# Patient Record
Sex: Female | Born: 1935 | Race: White | Hispanic: No | Marital: Married | Smoking: Never smoker
Health system: Southern US, Community
[De-identification: ages and names within clinical notes are randomized; demographics above are authoritative.]

## PROBLEM LIST (undated history)

## (undated) DIAGNOSIS — I495 Sick sinus syndrome: Secondary | ICD-10-CM

## (undated) DIAGNOSIS — I639 Cerebral infarction, unspecified: Secondary | ICD-10-CM

## (undated) DIAGNOSIS — I214 Non-ST elevation (NSTEMI) myocardial infarction: Secondary | ICD-10-CM

## (undated) DIAGNOSIS — I4891 Unspecified atrial fibrillation: Secondary | ICD-10-CM

---

## 2005-06-19 ENCOUNTER — Ambulatory Visit: Payer: Self-pay | Admitting: Internal Medicine

## 2006-06-24 ENCOUNTER — Ambulatory Visit: Payer: Self-pay | Admitting: Internal Medicine

## 2007-09-29 ENCOUNTER — Ambulatory Visit: Payer: Self-pay | Admitting: Internal Medicine

## 2008-10-04 ENCOUNTER — Ambulatory Visit: Payer: Self-pay | Admitting: Internal Medicine

## 2009-10-10 ENCOUNTER — Ambulatory Visit: Payer: Self-pay | Admitting: Internal Medicine

## 2009-10-27 ENCOUNTER — Emergency Department: Payer: Self-pay | Admitting: Emergency Medicine

## 2010-03-29 ENCOUNTER — Ambulatory Visit: Payer: Self-pay | Admitting: Internal Medicine

## 2010-05-23 ENCOUNTER — Ambulatory Visit: Payer: Self-pay | Admitting: Ophthalmology

## 2010-06-04 ENCOUNTER — Ambulatory Visit: Payer: Self-pay | Admitting: Ophthalmology

## 2010-07-02 ENCOUNTER — Ambulatory Visit: Payer: Self-pay | Admitting: Ophthalmology

## 2010-12-09 ENCOUNTER — Ambulatory Visit: Payer: Self-pay

## 2010-12-19 ENCOUNTER — Ambulatory Visit: Payer: Self-pay

## 2010-12-31 ENCOUNTER — Ambulatory Visit: Payer: Self-pay

## 2011-04-08 ENCOUNTER — Ambulatory Visit: Payer: Self-pay

## 2012-02-17 ENCOUNTER — Ambulatory Visit: Payer: Self-pay

## 2012-02-21 ENCOUNTER — Inpatient Hospital Stay: Payer: Self-pay | Admitting: Internal Medicine

## 2012-02-21 ENCOUNTER — Ambulatory Visit: Payer: Self-pay | Admitting: Orthopaedic Surgery

## 2012-02-21 LAB — URINALYSIS, COMPLETE
Glucose,UR: NEGATIVE mg/dL (ref 0–75)
Ketone: NEGATIVE
Protein: NEGATIVE
RBC,UR: NONE SEEN /HPF (ref 0–5)
Specific Gravity: 1.008 (ref 1.003–1.030)

## 2012-02-21 LAB — COMPREHENSIVE METABOLIC PANEL
Alkaline Phosphatase: 60 U/L (ref 50–136)
Anion Gap: 5 — ABNORMAL LOW (ref 7–16)
Bilirubin,Total: 0.3 mg/dL (ref 0.2–1.0)
Chloride: 103 mmol/L (ref 98–107)
Co2: 31 mmol/L (ref 21–32)
Creatinine: 1.14 mg/dL (ref 0.60–1.30)
EGFR (African American): 53 — ABNORMAL LOW
EGFR (Non-African Amer.): 46 — ABNORMAL LOW
Osmolality: 282 (ref 275–301)
Potassium: 3.6 mmol/L (ref 3.5–5.1)
SGOT(AST): 36 U/L (ref 15–37)
SGPT (ALT): 31 U/L
Sodium: 139 mmol/L (ref 136–145)

## 2012-02-21 LAB — PROTIME-INR
INR: 0.9
Prothrombin Time: 12.7 secs (ref 11.5–14.7)

## 2012-02-21 LAB — CBC
HGB: 11.6 g/dL — ABNORMAL LOW (ref 12.0–16.0)
MCH: 31.1 pg (ref 26.0–34.0)
MCHC: 33.1 g/dL (ref 32.0–36.0)
Platelet: 182 10*3/uL (ref 150–440)
RBC: 3.74 10*6/uL — ABNORMAL LOW (ref 3.80–5.20)
RDW: 13.4 % (ref 11.5–14.5)

## 2012-02-22 LAB — CBC WITH DIFFERENTIAL/PLATELET
Eosinophil #: 0 10*3/uL (ref 0.0–0.7)
Eosinophil %: 0.3 %
HGB: 10.4 g/dL — ABNORMAL LOW (ref 12.0–16.0)
Lymphocyte #: 1.4 10*3/uL (ref 1.0–3.6)
Lymphocyte %: 21.4 %
MCHC: 33.9 g/dL (ref 32.0–36.0)
Monocyte #: 0.6 x10 3/mm (ref 0.2–0.9)
Monocyte %: 8.9 %
Neutrophil #: 4.7 10*3/uL (ref 1.4–6.5)
Neutrophil %: 69.2 %
Platelet: 157 10*3/uL (ref 150–440)
WBC: 6.7 10*3/uL (ref 3.6–11.0)

## 2012-02-22 LAB — BASIC METABOLIC PANEL
BUN: 19 mg/dL — ABNORMAL HIGH (ref 7–18)
Calcium, Total: 8.4 mg/dL — ABNORMAL LOW (ref 8.5–10.1)
Chloride: 105 mmol/L (ref 98–107)
Co2: 28 mmol/L (ref 21–32)
Creatinine: 1.04 mg/dL (ref 0.60–1.30)
EGFR (African American): 59 — ABNORMAL LOW
Glucose: 108 mg/dL — ABNORMAL HIGH (ref 65–99)
Potassium: 3.6 mmol/L (ref 3.5–5.1)

## 2012-02-24 LAB — URINE CULTURE

## 2013-02-17 ENCOUNTER — Ambulatory Visit: Payer: Self-pay

## 2013-09-01 ENCOUNTER — Observation Stay: Payer: Self-pay | Admitting: Internal Medicine

## 2013-09-01 LAB — CBC WITH DIFFERENTIAL/PLATELET
Basophil #: 0 10*3/uL (ref 0.0–0.1)
Basophil %: 0.4 %
Eosinophil #: 0 10*3/uL (ref 0.0–0.7)
Eosinophil %: 0.1 %
HGB: 14.8 g/dL (ref 12.0–16.0)
Lymphocyte #: 1.7 10*3/uL (ref 1.0–3.6)
Lymphocyte %: 22.5 %
MCV: 92 fL (ref 80–100)
Neutrophil #: 5.5 10*3/uL (ref 1.4–6.5)
Neutrophil %: 70.6 %
Platelet: 276 10*3/uL (ref 150–440)
RDW: 13.5 % (ref 11.5–14.5)

## 2013-09-01 LAB — COMPREHENSIVE METABOLIC PANEL
Albumin: 3.8 g/dL (ref 3.4–5.0)
Anion Gap: 7 (ref 7–16)
Bilirubin,Total: 1.1 mg/dL — ABNORMAL HIGH (ref 0.2–1.0)
Chloride: 101 mmol/L (ref 98–107)
Co2: 26 mmol/L (ref 21–32)
Creatinine: 1.44 mg/dL — ABNORMAL HIGH (ref 0.60–1.30)
EGFR (African American): 40 — ABNORMAL LOW
EGFR (Non-African Amer.): 34 — ABNORMAL LOW
Glucose: 121 mg/dL — ABNORMAL HIGH (ref 65–99)
Sodium: 134 mmol/L — ABNORMAL LOW (ref 136–145)
Total Protein: 7.1 g/dL (ref 6.4–8.2)

## 2013-09-01 LAB — URINALYSIS, COMPLETE
Blood: NEGATIVE
Glucose,UR: NEGATIVE mg/dL (ref 0–75)
Nitrite: NEGATIVE
Ph: 7 (ref 4.5–8.0)
RBC,UR: 4 /HPF (ref 0–5)
Specific Gravity: 1.018 (ref 1.003–1.030)
WBC UR: 126 /HPF (ref 0–5)

## 2013-09-01 LAB — CK TOTAL AND CKMB (NOT AT ARMC)
CK, Total: 126 U/L (ref 21–215)
CK-MB: 5.2 ng/mL — ABNORMAL HIGH (ref 0.5–3.6)

## 2013-09-01 LAB — SEDIMENTATION RATE: Erythrocyte Sed Rate: 2 mm/hr (ref 0–30)

## 2013-09-01 LAB — TSH: Thyroid Stimulating Horm: 2.26 u[IU]/mL

## 2013-09-02 LAB — CBC WITH DIFFERENTIAL/PLATELET
Basophil #: 0 10*3/uL (ref 0.0–0.1)
Basophil %: 0.6 %
Eosinophil #: 0 10*3/uL (ref 0.0–0.7)
Eosinophil %: 0.8 %
HCT: 38.7 % (ref 35.0–47.0)
HGB: 13.4 g/dL (ref 12.0–16.0)
Lymphocyte #: 2.1 10*3/uL (ref 1.0–3.6)
MCH: 31.9 pg (ref 26.0–34.0)
MCV: 92 fL (ref 80–100)
Monocyte %: 9 %
Neutrophil #: 3.5 10*3/uL (ref 1.4–6.5)
Platelet: 220 10*3/uL (ref 150–440)
RDW: 13.7 % (ref 11.5–14.5)

## 2013-09-02 LAB — BASIC METABOLIC PANEL
Anion Gap: 6 — ABNORMAL LOW (ref 7–16)
BUN: 29 mg/dL — ABNORMAL HIGH (ref 7–18)
Chloride: 105 mmol/L (ref 98–107)
Co2: 26 mmol/L (ref 21–32)
EGFR (Non-African Amer.): 45 — ABNORMAL LOW
Glucose: 83 mg/dL (ref 65–99)
Potassium: 3.8 mmol/L (ref 3.5–5.1)

## 2013-09-02 LAB — LIPID PANEL: VLDL Cholesterol, Calc: 8 mg/dL (ref 5–40)

## 2013-09-19 ENCOUNTER — Ambulatory Visit: Payer: Self-pay | Admitting: Internal Medicine

## 2013-09-19 DIAGNOSIS — I214 Non-ST elevation (NSTEMI) myocardial infarction: Secondary | ICD-10-CM

## 2013-09-19 HISTORY — DX: Non-ST elevation (NSTEMI) myocardial infarction: I21.4

## 2013-09-24 ENCOUNTER — Inpatient Hospital Stay (HOSPITAL_COMMUNITY)
Admission: EM | Admit: 2013-09-24 | Discharge: 2013-10-04 | DRG: 023 | Disposition: A | Discharge status: Skilled Nursing Facility | Payer: Medicare Other | Attending: Neurology | Admitting: Neurology

## 2013-09-24 ENCOUNTER — Inpatient Hospital Stay (HOSPITAL_COMMUNITY): Payer: Medicare Other

## 2013-09-24 ENCOUNTER — Inpatient Hospital Stay (HOSPITAL_COMMUNITY): Payer: Medicare Other | Admitting: Anesthesiology

## 2013-09-24 ENCOUNTER — Encounter (HOSPITAL_COMMUNITY)
Admission: EM | Disposition: A | Discharge status: Skilled Nursing Facility | Payer: Self-pay | Source: Home / Self Care | Attending: Neurology

## 2013-09-24 ENCOUNTER — Encounter (HOSPITAL_COMMUNITY): Payer: Medicare Other | Admitting: Anesthesiology

## 2013-09-24 ENCOUNTER — Emergency Department (HOSPITAL_COMMUNITY): Payer: Medicare Other

## 2013-09-24 ENCOUNTER — Encounter (HOSPITAL_COMMUNITY): Payer: Self-pay | Admitting: Emergency Medicine

## 2013-09-24 DIAGNOSIS — E079 Disorder of thyroid, unspecified: Secondary | ICD-10-CM | POA: Diagnosis present

## 2013-09-24 DIAGNOSIS — R1314 Dysphagia, pharyngoesophageal phase: Secondary | ICD-10-CM | POA: Diagnosis present

## 2013-09-24 DIAGNOSIS — E039 Hypothyroidism, unspecified: Secondary | ICD-10-CM | POA: Diagnosis present

## 2013-09-24 DIAGNOSIS — I6340 Cerebral infarction due to embolism of unspecified cerebral artery: Principal | ICD-10-CM | POA: Diagnosis present

## 2013-09-24 DIAGNOSIS — E876 Hypokalemia: Secondary | ICD-10-CM | POA: Diagnosis present

## 2013-09-24 DIAGNOSIS — J810 Acute pulmonary edema: Secondary | ICD-10-CM | POA: Diagnosis present

## 2013-09-24 DIAGNOSIS — I498 Other specified cardiac arrhythmias: Secondary | ICD-10-CM | POA: Diagnosis not present

## 2013-09-24 DIAGNOSIS — Z66 Do not resuscitate: Secondary | ICD-10-CM | POA: Diagnosis present

## 2013-09-24 DIAGNOSIS — D72829 Elevated white blood cell count, unspecified: Secondary | ICD-10-CM | POA: Diagnosis present

## 2013-09-24 DIAGNOSIS — E860 Dehydration: Secondary | ICD-10-CM | POA: Diagnosis present

## 2013-09-24 DIAGNOSIS — J9601 Acute respiratory failure with hypoxia: Secondary | ICD-10-CM | POA: Diagnosis not present

## 2013-09-24 DIAGNOSIS — I509 Heart failure, unspecified: Secondary | ICD-10-CM | POA: Diagnosis present

## 2013-09-24 DIAGNOSIS — H5340 Unspecified visual field defects: Secondary | ICD-10-CM | POA: Diagnosis present

## 2013-09-24 DIAGNOSIS — R471 Dysarthria and anarthria: Secondary | ICD-10-CM | POA: Diagnosis present

## 2013-09-24 DIAGNOSIS — Z681 Body mass index (BMI) 19 or less, adult: Secondary | ICD-10-CM

## 2013-09-24 DIAGNOSIS — R64 Cachexia: Secondary | ICD-10-CM | POA: Diagnosis present

## 2013-09-24 DIAGNOSIS — I4891 Unspecified atrial fibrillation: Secondary | ICD-10-CM | POA: Diagnosis present

## 2013-09-24 DIAGNOSIS — I63411 Cerebral infarction due to embolism of right middle cerebral artery: Secondary | ICD-10-CM | POA: Diagnosis present

## 2013-09-24 DIAGNOSIS — J9600 Acute respiratory failure, unspecified whether with hypoxia or hypercapnia: Secondary | ICD-10-CM | POA: Diagnosis present

## 2013-09-24 DIAGNOSIS — D62 Acute posthemorrhagic anemia: Secondary | ICD-10-CM | POA: Diagnosis not present

## 2013-09-24 DIAGNOSIS — I6350 Cerebral infarction due to unspecified occlusion or stenosis of unspecified cerebral artery: Secondary | ICD-10-CM

## 2013-09-24 DIAGNOSIS — Z23 Encounter for immunization: Secondary | ICD-10-CM

## 2013-09-24 DIAGNOSIS — J8410 Pulmonary fibrosis, unspecified: Secondary | ICD-10-CM | POA: Diagnosis present

## 2013-09-24 DIAGNOSIS — I639 Cerebral infarction, unspecified: Secondary | ICD-10-CM

## 2013-09-24 DIAGNOSIS — J811 Chronic pulmonary edema: Secondary | ICD-10-CM | POA: Diagnosis not present

## 2013-09-24 DIAGNOSIS — I10 Essential (primary) hypertension: Secondary | ICD-10-CM | POA: Diagnosis present

## 2013-09-24 DIAGNOSIS — G8190 Hemiplegia, unspecified affecting unspecified side: Secondary | ICD-10-CM | POA: Diagnosis present

## 2013-09-24 HISTORY — PX: RADIOLOGY WITH ANESTHESIA: SHX6223

## 2013-09-24 HISTORY — DX: Unspecified atrial fibrillation: I48.91

## 2013-09-24 HISTORY — DX: Cerebral infarction, unspecified: I63.9

## 2013-09-24 LAB — RAPID URINE DRUG SCREEN, HOSP PERFORMED
Benzodiazepines: NOT DETECTED
Cocaine: NOT DETECTED
Opiates: NOT DETECTED
Tetrahydrocannabinol: NOT DETECTED

## 2013-09-24 LAB — URINALYSIS, ROUTINE W REFLEX MICROSCOPIC
Glucose, UA: NEGATIVE mg/dL
Hgb urine dipstick: NEGATIVE
Ketones, ur: 15 mg/dL — AB
Specific Gravity, Urine: 1.02 (ref 1.005–1.030)
Urobilinogen, UA: 1 mg/dL (ref 0.0–1.0)
pH: 5.5 (ref 5.0–8.0)

## 2013-09-24 LAB — BLOOD GAS, ARTERIAL
Acid-base deficit: 0.7 mmol/L (ref 0.0–2.0)
Drawn by: 28701
FIO2: 0.6 %
MECHVT: 400 mL
PEEP: 5 cmH2O
Patient temperature: 98.6
RATE: 14 resp/min
TCO2: 23 mmol/L (ref 0–100)
pH, Arterial: 7.506 — ABNORMAL HIGH (ref 7.350–7.450)
pO2, Arterial: 256 mmHg — ABNORMAL HIGH (ref 80.0–100.0)

## 2013-09-24 LAB — COMPREHENSIVE METABOLIC PANEL
AST: 89 U/L — ABNORMAL HIGH (ref 0–37)
Albumin: 2.8 g/dL — ABNORMAL LOW (ref 3.5–5.2)
BUN: 37 mg/dL — ABNORMAL HIGH (ref 6–23)
CO2: 21 mEq/L (ref 19–32)
Calcium: 9.1 mg/dL (ref 8.4–10.5)
Creatinine, Ser: 1.42 mg/dL — ABNORMAL HIGH (ref 0.50–1.10)
GFR calc non Af Amer: 34 mL/min — ABNORMAL LOW (ref >90)

## 2013-09-24 LAB — CBC
HCT: 32.5 % — ABNORMAL LOW (ref 36.0–46.0)
MCHC: 34.5 g/dL (ref 30.0–36.0)
MCV: 92.6 fL (ref 78.0–100.0)
RBC: 3.51 MIL/uL — ABNORMAL LOW (ref 3.87–5.11)
RDW: 13.7 % (ref 11.5–15.5)

## 2013-09-24 LAB — CBC WITH DIFFERENTIAL/PLATELET
Basophils Absolute: 0 10*3/uL (ref 0.0–0.1)
Eosinophils Absolute: 0 10*3/uL (ref 0.0–0.7)
Eosinophils Relative: 0 % (ref 0–5)
Lymphocytes Relative: 7 % — ABNORMAL LOW (ref 12–46)
Lymphs Abs: 0.7 10*3/uL (ref 0.7–4.0)
MCH: 31 pg (ref 26.0–34.0)
Neutrophils Relative %: 88 % — ABNORMAL HIGH (ref 43–77)
Platelets: 293 10*3/uL (ref 150–400)
RBC: 3.03 MIL/uL — ABNORMAL LOW (ref 3.87–5.11)
RDW: 13.8 % (ref 11.5–15.5)
WBC: 9.8 10*3/uL (ref 4.0–10.5)

## 2013-09-24 LAB — POCT I-STAT 7, (LYTES, BLD GAS, ICA,H+H)
Acid-base deficit: 9 mmol/L — ABNORMAL HIGH (ref 0.0–2.0)
Bicarbonate: 17.8 mEq/L — ABNORMAL LOW (ref 20.0–24.0)
Calcium, Ion: 1.1 mmol/L — ABNORMAL LOW (ref 1.13–1.30)
HCT: 25 % — ABNORMAL LOW (ref 36.0–46.0)
Hemoglobin: 8.5 g/dL — ABNORMAL LOW (ref 12.0–15.0)
Potassium: 2.6 mEq/L — CL (ref 3.5–5.1)
Sodium: 134 mEq/L — ABNORMAL LOW (ref 135–145)
pCO2 arterial: 39.9 mmHg (ref 35.0–45.0)
pH, Arterial: 7.258 — ABNORMAL LOW (ref 7.350–7.450)
pO2, Arterial: 151 mmHg — ABNORMAL HIGH (ref 80.0–100.0)

## 2013-09-24 LAB — POCT I-STAT, CHEM 8
BUN: 34 mg/dL — ABNORMAL HIGH (ref 6–23)
Chloride: 97 mEq/L (ref 96–112)
Creatinine, Ser: 1.6 mg/dL — ABNORMAL HIGH (ref 0.50–1.10)
Glucose, Bld: 100 mg/dL — ABNORMAL HIGH (ref 70–99)
Potassium: 3.3 mEq/L — ABNORMAL LOW (ref 3.5–5.1)
Sodium: 132 mEq/L — ABNORMAL LOW (ref 135–145)

## 2013-09-24 LAB — TROPONIN I: Troponin I: <0.3 ng/mL (ref <0.30)

## 2013-09-24 LAB — DIFFERENTIAL
Basophils Absolute: 0 10*3/uL (ref 0.0–0.1)
Basophils Relative: 0 % (ref 0–1)
Eosinophils Relative: 0 % (ref 0–5)
Lymphocytes Relative: 7 % — ABNORMAL LOW (ref 12–46)
Lymphs Abs: 0.7 10*3/uL (ref 0.7–4.0)
Monocytes Absolute: 0.5 10*3/uL (ref 0.1–1.0)

## 2013-09-24 LAB — MRSA PCR SCREENING: MRSA by PCR: NEGATIVE

## 2013-09-24 LAB — GLUCOSE, CAPILLARY: Glucose-Capillary: 150 mg/dL — ABNORMAL HIGH (ref 70–99)

## 2013-09-24 LAB — POCT I-STAT TROPONIN I: Troponin i, poc: 0.03 ng/mL (ref 0.00–0.08)

## 2013-09-24 LAB — ABO/RH: ABO/RH(D): A POS

## 2013-09-24 LAB — PREPARE RBC (CROSSMATCH)

## 2013-09-24 LAB — APTT: aPTT: 28 seconds (ref 24–37)

## 2013-09-24 LAB — ETHANOL: Alcohol, Ethyl (B): <11 mg/dL (ref 0–11)

## 2013-09-24 SURGERY — RADIOLOGY WITH ANESTHESIA
Anesthesia: General

## 2013-09-24 MED ORDER — DILTIAZEM LOAD VIA INFUSION
5.0000 mg | Freq: Once | INTRAVENOUS | Status: DC
  Filled 2013-09-24: qty 5

## 2013-09-24 MED ORDER — NITROGLYCERIN 1 MG/10 ML FOR IR/CATH LAB
100.0000 ug | INTRA_ARTERIAL | Status: AC
  Filled 2013-09-24: qty 10

## 2013-09-24 MED ORDER — LABETALOL HCL 5 MG/ML IV SOLN: 10.0000 mg | INTRAVENOUS | Status: DC | PRN

## 2013-09-24 MED ORDER — SENNOSIDES-DOCUSATE SODIUM 8.6-50 MG PO TABS
1.0000 | ORAL_TABLET | Freq: Every evening | ORAL | Status: DC | PRN
  Filled 2013-09-24: qty 1

## 2013-09-24 MED ORDER — ALBUMIN HUMAN 5 % IV SOLN
INTRAVENOUS | Status: DC | PRN
  Administered 2013-09-24: 15:00:00 via INTRAVENOUS

## 2013-09-24 MED ORDER — DEXTROSE 5 % IV SOLN
2.0000 g | Freq: Once | INTRAVENOUS | Status: AC
  Administered 2013-09-24: 2 g via INTRAVENOUS
  Filled 2013-09-24: qty 20

## 2013-09-24 MED ORDER — FENTANYL CITRATE 0.05 MG/ML IJ SOLN
50.0000 ug | INTRAMUSCULAR | Status: DC | PRN
  Administered 2013-09-24 – 2013-09-25 (×2): 50 ug via INTRAVENOUS
  Filled 2013-09-24 (×2): qty 2

## 2013-09-24 MED ORDER — CHLORHEXIDINE GLUCONATE 0.12 % MT SOLN
OROMUCOSAL | Status: AC
  Administered 2013-09-24: 15 mL
  Filled 2013-09-24: qty 15

## 2013-09-24 MED ORDER — ALTEPLASE (STROKE) FULL DOSE INFUSION
0.9000 mg/kg | Freq: Once | INTRAVENOUS | Status: AC
  Administered 2013-09-24: 41 mg via INTRAVENOUS
  Filled 2013-09-24: qty 41

## 2013-09-24 MED ORDER — CHLORHEXIDINE GLUCONATE 0.12 % MT SOLN
15.0000 mL | Freq: Two times a day (BID) | OROMUCOSAL | Status: DC
  Administered 2013-09-24 – 2013-10-04 (×20): 15 mL via OROMUCOSAL
  Filled 2013-09-24 (×20): qty 15

## 2013-09-24 MED ORDER — HYDROMORPHONE HCL PF 1 MG/ML IJ SOLN: 0.2500 mg | INTRAMUSCULAR | Status: DC | PRN

## 2013-09-24 MED ORDER — ONDANSETRON HCL 4 MG/2ML IJ SOLN: 4.0000 mg | Freq: Once | INTRAMUSCULAR | Status: DC | PRN

## 2013-09-24 MED ORDER — POTASSIUM CHLORIDE 10 MEQ/100ML IV SOLN
10.0000 meq | INTRAVENOUS | Status: AC
  Administered 2013-09-24 (×2): 10 meq via INTRAVENOUS
  Filled 2013-09-24 (×2): qty 100

## 2013-09-24 MED ORDER — VECURONIUM BROMIDE 10 MG IV SOLR
INTRAVENOUS | Status: DC | PRN
  Administered 2013-09-24: 10 mg via INTRAVENOUS

## 2013-09-24 MED ORDER — DILTIAZEM HCL 100 MG IV SOLR
5.0000 mg/h | INTRAVENOUS | Status: DC
  Administered 2013-09-26 – 2013-09-28 (×4): 5 mg/h via INTRAVENOUS
  Filled 2013-09-24 (×6): qty 100

## 2013-09-24 MED ORDER — ALTEPLASE 30 MG/30 ML FOR INTERV. RAD
1.0000 mg | INTRA_ARTERIAL | Status: AC | PRN
  Administered 2013-09-24: 2 mg via INTRA_ARTERIAL
  Administered 2013-09-24: 6 mg via INTRA_ARTERIAL
  Administered 2013-09-24: 2.8 mg via INTRA_ARTERIAL
  Filled 2013-09-24: qty 30

## 2013-09-24 MED ORDER — PANTOPRAZOLE SODIUM 40 MG IV SOLR
40.0000 mg | Freq: Every day | INTRAVENOUS | Status: DC
  Administered 2013-09-24 – 2013-09-27 (×4): 40 mg via INTRAVENOUS
  Filled 2013-09-24 (×7): qty 40

## 2013-09-24 MED ORDER — IOHEXOL 300 MG/ML  SOLN
150.0000 mL | Freq: Once | INTRAMUSCULAR | Status: AC | PRN
  Administered 2013-09-24: 100 mL via INTRA_ARTERIAL

## 2013-09-24 MED ORDER — CEFAZOLIN SODIUM 1-5 GM-% IV SOLN
INTRAVENOUS | Status: AC
  Administered 2013-09-24: 2 g via INTRAVENOUS
  Filled 2013-09-24: qty 100

## 2013-09-24 MED ORDER — ACETAMINOPHEN 650 MG RE SUPP: 650.0000 mg | RECTAL | Status: DC | PRN

## 2013-09-24 MED ORDER — ACETAMINOPHEN 650 MG RE SUPP: 650.0000 mg | Freq: Four times a day (QID) | RECTAL | Status: DC | PRN

## 2013-09-24 MED ORDER — PHENYLEPHRINE HCL 10 MG/ML IJ SOLN
10.0000 mg | INTRAVENOUS | Status: DC | PRN
  Administered 2013-09-24: 40 ug/min via INTRAVENOUS

## 2013-09-24 MED ORDER — METOPROLOL TARTRATE 1 MG/ML IV SOLN
2.5000 mg | Freq: Once | INTRAVENOUS | Status: AC
  Administered 2013-09-24: 2.5 mg via INTRAVENOUS
  Filled 2013-09-24: qty 5

## 2013-09-24 MED ORDER — SODIUM CHLORIDE 0.9 % IV SOLN: INTRAVENOUS | Status: DC

## 2013-09-24 MED ORDER — DILTIAZEM HCL 100 MG IV SOLR
100.0000 mg | INTRAVENOUS | Status: DC | PRN
  Administered 2013-09-24: 5 mg via INTRAVENOUS

## 2013-09-24 MED ORDER — ONDANSETRON HCL 4 MG/2ML IJ SOLN: 4.0000 mg | Freq: Four times a day (QID) | INTRAMUSCULAR | Status: DC | PRN

## 2013-09-24 MED ORDER — PROPOFOL 10 MG/ML IV BOLUS
INTRAVENOUS | Status: DC | PRN
  Administered 2013-09-24: 80 mg via INTRAVENOUS

## 2013-09-24 MED ORDER — SODIUM CHLORIDE 0.9 % IV SOLN
250.0000 mL | Freq: Once | INTRAVENOUS | Status: AC
  Administered 2013-09-24: 250 mL via INTRAVENOUS

## 2013-09-24 MED ORDER — SUCCINYLCHOLINE CHLORIDE 20 MG/ML IJ SOLN
INTRAMUSCULAR | Status: DC | PRN
  Administered 2013-09-24: 100 mg via INTRAVENOUS

## 2013-09-24 MED ORDER — ACETAMINOPHEN 500 MG PO TABS: 1000.0000 mg | ORAL_TABLET | Freq: Four times a day (QID) | ORAL | Status: DC | PRN

## 2013-09-24 MED ORDER — DILTIAZEM HCL 100 MG IV SOLR
100.0000 mg | INTRAVENOUS | Status: DC | PRN
  Administered 2013-09-24: 5 mg/h via INTRAVENOUS

## 2013-09-24 MED ORDER — PROPOFOL 10 MG/ML IV EMUL
10.0000 ug/kg/min | INTRAVENOUS | Status: DC
  Administered 2013-09-24: 50 ug/kg/min via INTRAVENOUS
  Administered 2013-09-25: 25 ug/kg/min via INTRAVENOUS
  Filled 2013-09-24 (×2): qty 100

## 2013-09-24 MED ORDER — SODIUM BICARBONATE 8.4 % IV SOLN
INTRAVENOUS | Status: DC | PRN
  Administered 2013-09-24 (×2): 50 meq via INTRAVENOUS

## 2013-09-24 MED ORDER — NICARDIPINE HCL IN NACL 20-0.86 MG/200ML-% IV SOLN: 5.0000 mg/h | INTRAVENOUS | Status: DC

## 2013-09-24 MED ORDER — SODIUM CHLORIDE 0.9 % IV SOLN
INTRAVENOUS | Status: DC
  Administered 2013-09-24 (×2): via INTRAVENOUS

## 2013-09-24 MED ORDER — ACETAMINOPHEN 325 MG PO TABS: 650.0000 mg | ORAL_TABLET | ORAL | Status: DC | PRN

## 2013-09-24 NOTE — ED Notes (Signed)
Pt's husband at bedside. Pt now able to move left leg spontaneously.

## 2013-09-24 NOTE — ED Notes (Signed)
Pt is itching the right side of her face. Pt is resting in bed with head of bed flat and IV fluids running

## 2013-09-24 NOTE — Progress Notes (Signed)
Pt. met in IR bagged by CRNA to CT, then transported on vent to 59M.

## 2013-09-24 NOTE — Code Documentation (Signed)
Code stroke was activated.  As per patients husband, was in usual state of health and at 0900, patient developed  Sudden paralysis of left side and slurred speech.  NIHSS 19, TPA given.

## 2013-09-24 NOTE — Consult Note (Addendum)
Referring Physician:  ED    Chief Complaint: CODE STROKE: DYSARTHRIA WITH LEFT HEMIPARESIS.  HPI:                                                                                                                                         Dorothy [name] is an 79 y.o. female with a past medical history significant for  atrial fibrillation, brought to [facility] ED by ambulance as a code stroke due tithe above stated symptoms. She was last known well at 9 am today, talking to her husband and suddenly became less responsive, fell to the floor, and was EMS arrived shew was not able to move the left side and had dysarthria. Never had similar symptoms before. Initial NIHSS 19. CT brain revealed questionable ill defined area of hypodensity left MCA territory as well as question minimal hyperdensity of the right carotid terminus/M1 segment right middle cerebral artery raises the possibility of thrombus at this level. She is alert and awake, following commands, and started improving in then ED after receiving the IV tpa bolus.      Date last known well: 09/24/13 Time last known well: 9 am tPA Given: yes NIHSS: 19 MRS:   History reviewed. No pertinent past medical history.  History reviewed. No pertinent past surgical history.  No family history on file. Social History:  has no tobacco, alcohol, and drug history on file.  Allergies: Allergies not on file  Medications:                                                                                                                           I have reviewed the patient's current medications.  ROS:  Unable to obtain reliably due to mental status.                                                                                                      History obtained from chart review and husband.  Physical exam: pleasant female in no apparent distress. Blood pressure 127/75, pulse 130, temperature 97.6 F (36.4 C), temperature source Oral, resp. rate 21, weight  45.36 kg (100 lb), SpO2 93.00%. Head: normocephalic. Neck: supple, no bruits, no JVD. Cardiac: no murmurs. Lungs: clear. Abdomen: soft, no tender, no mass. Extremities: no edema.  Neurologic Examination:                                                                                                      Mental Status: Alert, awake, oriented, thought content appropriate.  Mild dysarthria/dysphasia.  Able to follow 3 step commands without difficulty. Cranial Nerves: II: Discs flat bilaterally; left visual filed defect, pupils equal, round, reactive to light and accommodation III,IV, VI: ptosis not present, right gaze preference. V,VII: smile asymmetric with left face weakness, facial light touch sensation normal bilaterally VIII: hearing normal bilaterally IX,X: gag reflex present XI: bilateral shoulder shrug XII: midline tongue extension without atrophy or fasciculations  Motor: Significant for left HP Tone and bulk:normal tone throughout; no atrophy noted Sensory: Pinprick and light touch intact throughout, bilaterally Deep Tendon Reflexes:  Right: Upper Extremity   Left: Upper extremity   biceps (C-5 to C-6) 2/4   biceps (C-5 to C-6) 2/4 tricep (C7) 2/4    triceps (C7) 2/4 Brachioradialis (C6) 2/4  Brachioradialis (C6) 2/4  Lower Extremity Lower Extremity  quadriceps (L-2 to L-4) 2/4   quadriceps (L-2 to L-4) 2/4 Achilles (S1) 2/4   Achilles (S1) 2/4  Plantars: Right: downgoing   Left: upgoing Cerebellar: normal finger-to-nose,  normal heel-to-shin test Gait:  No tested. CV: pulses palpable throughout    Results for orders placed during the hospital encounter of 09/24/13 (from the past 48 hour(s))  ETHANOL     Status: None   Collection Time    09/24/13 11:05 AM      Result Value Range   Alcohol, Ethyl (B) <11  0 - 11 mg/dL   Comment:            LOWEST DETECTABLE LIMIT FOR     SERUM ALCOHOL IS 11 mg/dL     FOR MEDICAL PURPOSES ONLY  PROTIME-INR     Status: None    Collection Time    09/24/13 11:05 AM      Result Value Range   Prothrombin Time 14.3  11.6 - 15.2 seconds   INR 1.13  0.00 - 1.49  APTT     Status: None   Collection Time    09/24/13 11:05 AM      Result Value Range   aPTT 28  24 - 37 seconds  CBC     Status: Abnormal   Collection Time    09/24/13 11:05 AM      Result Value Range   WBC 9.6  4.0 - 10.5 K/uL   RBC 3.51 (*) 3.87 - 5.11 MIL/uL   Hemoglobin 11.2 (*) 12.0 - 15.0 g/dL   HCT 40.9 (*) 81.1 - 91.4 %   MCV 92.6  78.0 - 100.0 fL   MCH 31.9  26.0 - 34.0 pg  MCHC 34.5  30.0 - 36.0 g/dL   RDW 81.1  91.4 - 78.2 %   Platelets 344  150 - 400 K/uL  DIFFERENTIAL     Status: Abnormal   Collection Time    09/24/13 11:05 AM      Result Value Range   Neutrophils Relative % 88 (*) 43 - 77 %   Neutro Abs 8.5 (*) 1.7 - 7.7 K/uL   Lymphocytes Relative 7 (*) 12 - 46 %   Lymphs Abs 0.7  0.7 - 4.0 K/uL   Monocytes Relative 5  3 - 12 %   Monocytes Absolute 0.5  0.1 - 1.0 K/uL   Eosinophils Relative 0  0 - 5 %   Eosinophils Absolute 0.0  0.0 - 0.7 K/uL   Basophils Relative 0  0 - 1 %   Basophils Absolute 0.0  0.0 - 0.1 K/uL  COMPREHENSIVE METABOLIC PANEL     Status: Abnormal   Collection Time    09/24/13 11:05 AM      Result Value Range   Sodium 131 (*) 135 - 145 mEq/L   Potassium 3.2 (*) 3.5 - 5.1 mEq/L   Chloride 91 (*) 96 - 112 mEq/L   CO2 21  19 - 32 mEq/L   Glucose, Bld 99  70 - 99 mg/dL   BUN 37 (*) 6 - 23 mg/dL   Creatinine, Ser 9.56 (*) 0.50 - 1.10 mg/dL   Calcium 9.1  8.4 - 21.3 mg/dL   Total Protein 6.4  6.0 - 8.3 g/dL   Albumin 2.8 (*) 3.5 - 5.2 g/dL   AST 89 (*) 0 - 37 U/L   ALT 91 (*) 0 - 35 U/L   Alkaline Phosphatase 115  39 - 117 U/L   Total Bilirubin 0.6  0.3 - 1.2 mg/dL   GFR calc non Af Amer 34 (*) >90 mL/min   GFR calc Af Amer 39 (*) >90 mL/min   Comment: (NOTE)     The eGFR has been calculated using the CKD EPI equation.     This calculation has not been validated in all clinical situations.      eGFR's persistently <90 mL/min signify possible Chronic Kidney     Disease.  TROPONIN I     Status: None   Collection Time    09/24/13 11:05 AM      Result Value Range   Troponin I <0.30  <0.30 ng/mL   Comment:            Due to the release kinetics of cTnI,     a negative result within the first hours     of the onset of symptoms does not rule out     myocardial infarction with certainty.     If myocardial infarction is still suspected,     repeat the test at appropriate intervals.  POCT I-STAT TROPONIN I     Status: None   Collection Time    09/24/13 11:19 AM      Result Value Range   Troponin i, poc 0.03  0.00 - 0.08 ng/mL   Comment 3            Comment: Due to the release kinetics of cTnI,     a negative result within the first hours     of the onset of symptoms does not rule out     myocardial infarction with certainty.     If myocardial infarction is still suspected,     repeat  the test at appropriate intervals.  POCT I-STAT, CHEM 8     Status: Abnormal   Collection Time    09/24/13 11:20 AM      Result Value Range   Sodium 132 (*) 135 - 145 mEq/L   Potassium 3.3 (*) 3.5 - 5.1 mEq/L   Chloride 97  96 - 112 mEq/L   BUN 34 (*) 6 - 23 mg/dL   Creatinine, Ser 2.13 (*) 0.50 - 1.10 mg/dL   Glucose, Bld 086 (*) 70 - 99 mg/dL   Calcium, Ion 5.78  4.69 - 1.30 mmol/L   TCO2 23  0 - 100 mmol/L   Hemoglobin 12.2  12.0 - 15.0 g/dL   HCT 62.9  52.8 - 41.3 %  GLUCOSE, CAPILLARY     Status: None   Collection Time    09/24/13 11:29 AM      Result Value Range   Glucose-Capillary 88  70 - 99 mg/dL   Ct Head Wo Contrast  09/24/2013   CLINICAL DATA:  Left-sided paralysis.  Difficulty speaking.  EXAM: CT HEAD WITHOUT CONTRAST  TECHNIQUE: Contiguous axial images were obtained from the base of the skull through the vertex without intravenous contrast.  COMPARISON:  04/08/2011.  FINDINGS: Indistinct appearance of the right basal ganglia, right caudate, right subinsular region, right peri  operculum region and portion of the right frontal lobe suggestive of a right middle cerebral artery acute infarct involving over 1/3 of the distribution of the right middle cerebral artery.  Question minimal hyperdensity of the right carotid terminus/M1 segment right middle cerebral artery raises the possibility of thrombus at this level.  No intracranial hemorrhage currently noted.  No intracranial mass lesion noted on this unenhanced exam.  Vascular calcifications.  IMPRESSION: Indistinct appearance of the right basal ganglia, right caudate, right subinsular region, right peri operculum region and portion of the right frontal lobe suggestive of a right middle cerebral artery acute infarct involving over 1/3 of the distribution of the right middle cerebral artery.  Question minimal hyperdensity of the right carotid terminus/M1 segment right middle cerebral artery raises the possibility of thrombus at this level.  No intracranial hemorrhage currently noted.  These results were called by telephone at the time of interpretation on 09/24/2013 at 11:24 AM to Dr. Cyril Mourning, who verbally acknowledged these results.   Electronically Signed   By: Bridgett Larsson M.D.   On: 09/24/2013 11:31     Assessment: 79 y.o. female with probable embolic right MCA ischemic infarct. NIHSS 19. CT brain with questionable, ill defined hypodensity right MCA territory and ? Hyperdense right MCA. Will go ah ead with IV thrombolysis, as the CT brain findings are not entirely clear. High NIHSS is concerning for major intracranial arterial involvement by a clot, and thus spoke with interventional neuroradiology who will take patient to the angio suite and proceed accordingly. Stroke team to resume care. Will follow up closely.  Stroke Risk Factors - age, HTN, atrial fibrillation.  Plan: 1. HgbA1c, fasting lipid panel 2. MRI, MRA  of the brain without contrast 3. Echocardiogram 4. Carotid dopplers 5. Prophylactic therapy-Antiplatelet med:  Aspirin 24 hout after tpa if no contraindications. 6. Risk factor modification 7. Telemetry monitoring 8. Frequent neuro checks 9. PT/OT SLP   Wyatt Portela, MD [facility] [phone]  09/24/2013, 11:50 AM

## 2013-09-24 NOTE — ED Notes (Signed)
Pt was last seen normal at 0920. Pt was reportedly out doing yard work this morning when the husband saw her. He found her at 0920 with left sided weakness. Pt was brought to ED and arrived 1103. Pt unable to move left side. Pt is able to speak and respond to questions and is alert to self. However, pt does not know where she is and is not oriented to date. Pt's speech is slurred.

## 2013-09-24 NOTE — ED Notes (Signed)
All pts belongings given to pt's husband.

## 2013-09-24 NOTE — Consult Note (Signed)
PULMONARY  / CRITICAL CARE MEDICINE CONSULTATION  Name: Dorothy [name] MRN: [id number] DOB: 16-Sep-1931    ADMISSION DATE:  09/24/2013  CHIEF COMPLAINT:  Stroke   BRIEF PATIENT DESCRIPTION: 79 yo female with atrial fibrillation who presented with left hemiparesis and dysarthria, now status post cerebral angiogram with revascularization of occluded right MCA.  SIGNIFICANT EVENTS / STUDIES:  1. CT Head 09/24/2013 11:34 - suggestive of R MCA acute infarct, no intracranial hemorrhage 2. Alteplase 09/24/2013 11:45 3. IR R common carotid arteriogram, revascularization of occluded R MCA with tPA and 2 passes with retrieval device 09/24/2013 4. CT Head 09/24/2013 17:34 - mild evolution R MCA acute infarct, no intracranial hemorrhage 5. Intubation for procedure 09/24/2013  LINES / TUBES: 1. ETT 09/24/2013 >>> 2. L radial art line 09/24/2013 >>> 3. R femoral art line 09/24/2013 >>>   CULTURES: None  ANTIBIOTICS: 1. Cefazolin 2 g IV x 1 09/24/2013  HISTORY OF PRESENT ILLNESS:   79 yo female with atrial fibrillation who was brought to ED as a code stroke. She was talking to her husband the morning of admission, then became less responsive, fell to the floor and upon EMS arrival was not able to move her L side and had dysarthria. CT Head was concerning for R MCA acute infarct and she received tPA in ED with some improvement in symptoms. She was taken to IR for R common carotid arteriogram with revascularization of occluded R MCA and returned to the Neuro ICU on mechanical ventilation. We are now seeing the patient in consultation at the request of Dr. Delia Heady for evaluation and management of mechanical ventilation.  PAST MEDICAL HISTORY :  Atrial fibrillation per medical record.  History reviewed. No pertinent past surgical history. per medical record  Prior to Admission medications   Medication Sig Start Date End Date Taking? Authorizing Provider  aspirin EC 81 MG tablet Take 81 mg by mouth  daily.   Yes Historical Provider, MD  Biotin 1000 MCG tablet Take 1,000 mcg by mouth daily.   Yes Historical Provider, MD  Calcium-Vitamin D (CALTRATE 600 PLUS-VIT D PO) Take 1 tablet by mouth daily.   Yes Historical Provider, MD  cholecalciferol (VITAMIN D) 1000 UNITS tablet Take 1,000 Units by mouth daily.   Yes Historical Provider, MD  digoxin (LANOXIN) 0.125 MG tablet Take by mouth daily.   Yes Historical Provider, MD  levothyroxine (SYNTHROID, LEVOTHROID) 75 MCG tablet Take 75 mcg by mouth daily before breakfast.   Yes Historical Provider, MD  Misc Natural Products (OSTEO BI-FLEX TRIPLE STRENGTH PO) Take 1 tablet by mouth daily.   Yes Historical Provider, MD  Multiple Vitamins-Minerals (CENTRUM SILVER ULTRA WOMENS) TABS Take 1 tablet by mouth daily.   Yes Historical Provider, MD  pentoxifylline (TRENTAL) 400 MG CR tablet Take 400 mg by mouth 3 (three) times daily with meals.   Yes Historical Provider, MD  Probiotic Product (PHILLIPS COLON HEALTH) CAPS Take 1 capsule by mouth daily.   Yes Historical Provider, MD  traMADol (ULTRAM) 50 MG tablet Take 50 mg by mouth 3 (three) times daily as needed for moderate pain.   Yes Historical Provider, MD  triamterene-hydrochlorothiazide (DYAZIDE) 37.5-25 MG per capsule Take 1 capsule by mouth daily.   Yes Historical Provider, MD    No Known Allergies  FAMILY HISTORY:  No family history on file. Unable to obtain from patient at this time.  SOCIAL HISTORY:  has no tobacco, alcohol, and drug history on file. Unable to obtain from patient at  this time.  REVIEW OF SYSTEMS:  Unable to obtain, patient intubated and sedated.  PHYSICAL EXAM  VITAL SIGNS: Temp:  [85.8 F (29.9 C)-97.6 F (36.4 C)] 94.5 F (34.7 C) (12/06 1900) Pulse Rate:  [57-142] 74 (12/06 2000) Resp:  [14-24] 21 (12/06 2000) BP: (92-127)/(49-77) 123/64 mmHg (12/06 2000) SpO2:  [79 %-100 %] 100 % (12/06 2000) Arterial Line BP: (62-122)/(36-52) 102/47 mmHg (12/06 1900) FiO2 (%):   [60 %] 60 % (12/06 2000) Weight:  [100 lb (45.36 kg)-109 lb 12.6 oz (49.8 kg)] 109 lb 12.6 oz (49.8 kg) (12/06 1700)   VENTILATOR SETTINGS: Vent Mode:  [-] PRVC FiO2 (%):  [60 %] 60 % Set Rate:  [14 bmp] 14 bmp Vt Set:  [400 mL] 400 mL PEEP:  [5 cmH20] 5 cmH20 Plateau Pressure:  [12 cmH20-13 cmH20] 12 cmH20  INTAKE / OUTPUT: Intake/Output     12/06 0701 - 12/07 0700   I.V. (mL/kg) 2073.1 (41.6)   IV Piggyback 450   Total Intake(mL/kg) 2523.1 (50.7)   Urine (mL/kg/hr) 320   Total Output 320   Net +2203.1         PHYSICAL EXAMINATION: General:  Elderly female, intubated and sedated, no acute distress Neuro:  Unable to assess due to sedation, appears to attempt to open eyes to voice HEENT:  AT, [place], PERRL, ETT in place  Neck:  No JVD noted  Cardiovascular:  Irregularly irregular, +2 pulses bilaterally, trace BLE pitting edema  Lungs:  Good air movement, bilateral rhonchi anteriorly Abdomen:  +BS, soft, nontender, nondistended Musculoskeletal:  No clubbing or cyanosis  Skin:  No rash noted  LABS:  CBC Recent Labs     09/24/13  1105  09/24/13  1120  09/24/13  1453  09/24/13  1800  WBC  9.6   --    --   9.8  HGB  11.2*  12.2  8.5*  9.4*  HCT  32.5*  36.0  25.0*  27.9*  PLT  344   --    --   293    Coag's Recent Labs     09/24/13  1105  APTT  28  INR  1.13    BMET Recent Labs     09/24/13  1105  09/24/13  1120  09/24/13  1453  NA  131*  132*  134*  K  3.2*  3.3*  2.6*  CL  91*  97   --   CO2  21   --    --   BUN  37*  34*   --   CREATININE  1.42*  1.60*   --   GLUCOSE  99  100*   --     Electrolytes Recent Labs     09/24/13  1105  CALCIUM  9.1    Sepsis Markers No results found for this basename: LACTICACIDVEN, PROCALCITON, O2SATVEN,  in the last 72 hours  ABG Recent Labs     09/24/13  1453  PHART  7.258*  PCO2ART  39.9  PO2ART  151.0*    Liver Enzymes Recent Labs     09/24/13  1105  AST  89*  ALT  91*  ALKPHOS  115  BILITOT   0.6  ALBUMIN  2.8*    Cardiac Enzymes Recent Labs     09/24/13  1105  TROPONINI  <0.30    Glucose Recent Labs     09/24/13  1129  09/24/13  1707  09/24/13  2013  GLUCAP  88  150*  152*    Imaging Ct Head Wo Contrast  09/24/2013   CLINICAL DATA:  Status post interventional procedure.  EXAM: CT HEAD WITHOUT CONTRAST  TECHNIQUE: Contiguous axial images were obtained from the base of the skull through the vertex without intravenous contrast.  COMPARISON:  Earlier today.  FINDINGS: Mildly progressive ill-defined low density in a right middle cerebral artery distribution. No intracranial hemorrhage or mass-effect. Stable mildly enlarged ventricles and subarachnoid spaces. Unremarkable bones and included paranasal sinuses.  IMPRESSION: Mild evolution of a right middle cerebral artery distribution acute infarct. No intracranial hemorrhage.   Electronically Signed   By: Gordan Payment M.D.   On: 09/24/2013 17:31   Ct Head Wo Contrast  09/24/2013   CLINICAL DATA:  Left-sided paralysis.  Difficulty speaking.  EXAM: CT HEAD WITHOUT CONTRAST  TECHNIQUE: Contiguous axial images were obtained from the base of the skull through the vertex without intravenous contrast.  COMPARISON:  04/08/2011.  FINDINGS: Indistinct appearance of the right basal ganglia, right caudate, right subinsular region, right peri operculum region and portion of the right frontal lobe suggestive of a right middle cerebral artery acute infarct involving over 1/3 of the distribution of the right middle cerebral artery.  Question minimal hyperdensity of the right carotid terminus/M1 segment right middle cerebral artery raises the possibility of thrombus at this level.  No intracranial hemorrhage currently noted.  No intracranial mass lesion noted on this unenhanced exam.  Vascular calcifications.  IMPRESSION: Indistinct appearance of the right basal ganglia, right caudate, right subinsular region, right peri operculum region and portion of  the right frontal lobe suggestive of a right middle cerebral artery acute infarct involving over 1/3 of the distribution of the right middle cerebral artery.  Question minimal hyperdensity of the right carotid terminus/M1 segment right middle cerebral artery raises the possibility of thrombus at this level.  No intracranial hemorrhage currently noted.  These results were called by telephone at the time of interpretation on 09/24/2013 at 11:24 AM to Dr. Cyril Mourning, who verbally acknowledged these results.   Electronically Signed   By: Bridgett Larsson M.D.   On: 09/24/2013 11:31   Dg Chest Port 1 View  09/24/2013   CLINICAL DATA:  Status post stroke  EXAM: PORTABLE CHEST - 1 VIEW  COMPARISON:  09/01/2013  FINDINGS: Endotracheal tube tip is in satisfactory position above the carina. The nasogastric tube tip is in the stomach with side port below GE junction. Normal heart size. There is moderate diffuse interstitial edema which is new from the previous examination.  IMPRESSION: 1. Status post intubation.  Tip of tube is of above the carina. 2. Moderate pulmonary edema.   Electronically Signed   By: Signa Kell M.D.   On: 09/24/2013 18:36     CXR: personally reviewed, ETT present, bilateral interstitial infiltrates  ASSESSMENT / PLAN: Active Problems:   Stroke with cerebral ischemia  79 yo female with atrial fibrillation, presenting with R MCA stroke s/p tPA and R common carotid arteriogram with revascularization of prox R MCA, now on mechanical ventilation post-procedure.   1. Mechanical ventilation post-procedure  Agree with current ventilator settings on PRVC, goal TV 8 cc/kg IBW, PEEP 5, Rate 14, FiO2 60%  Wean FiO2 as tolerated  Follow-up ABG to ensure adequacy of mechanical ventilation and titrate settings accordingly  Agree with propofol and added PRN Fentanyl for sedation, wean sedation as tolerated  Goal RASS 0 to -1  Recommend holding sedation in AM and trial SBT if able  2. Pulmonary  edema  Monitor I/O closely and avoid excess volume given presence of edema on CXR and exam  Follow-up echocardiogram results  Consider diuresis if able, particularly prior to considering extubation  3. R MCA stroke  Defer to Neurology  I have personally obtained a history, examined the patient, evaluated laboratory and imaging results, formulated the assessment and plan and placed orders.  Germain Osgood, MD Pulmonary and Critical Care Medicine [facility] Pager: [phone]  09/24/2013, 9:00 PM

## 2013-09-24 NOTE — ED Notes (Signed)
TPA almost done infusing. No signs of bleeding. Pt is Afib on the monitor ranging from the 110's-140's. Notified MD and received order for lopressor.

## 2013-09-24 NOTE — ED Provider Notes (Signed)
CSN: [id number]     Arrival date & time 09/24/13  1103 History   First MD Initiated Contact with Patient 09/24/13 1115     Chief Complaint  Patient presents with  . Code Stroke   low 5 caveat urgency of situation (Consider location/radiation/quality/duration/timing/severity/associated sxs/prior Treatment) HPI Patient last normal 9:20 AM today. She presents with left-sided paralysis and speech slurred. Brought by EMS. No treatment prior to coming here. Code stroke called in the field. No past medical history on file. No past surgical history on file. No family history on file. History  Substance Use Topics  . Smoking status: Not on file  . Smokeless tobacco: Not on file  . Alcohol Use: Not on file   OB History   No data available     Review of Systems  Unable to perform ROS: Acuity of condition    Allergies  Review of patient's allergies indicates not on file.  Home Medications  No current outpatient prescriptions on file. There were no vitals taken for this visit. Physical Exam  Constitutional:  Chronically ill-appearing, cachectic  HENT:  Right Ear: External ear normal.  Left Ear: External ear normal.  Edentulous  Eyes:  Rightward gaze  Neck: Neck supple.  Cardiovascular: Normal rate, regular rhythm and normal heart sounds.   Pulmonary/Chest: Breath sounds normal.  Abdominal: Soft. Bowel sounds are normal. There is no tenderness.  Musculoskeletal:  Left-sided hemiparalysis  Neurological:  DTRs symmetric bilaterally knee jerk ankle jerk biceps left upper going right to downward going    ED Course  Procedures (including critical care time) Labs Review Labs Reviewed  CBC - Abnormal; Notable for the following:    RBC 3.51 (*)    Hemoglobin 11.2 (*)    HCT 32.5 (*)    All other components within normal limits  DIFFERENTIAL - Abnormal; Notable for the following:    Neutrophils Relative % 88 (*)    Neutro Abs 8.5 (*)    Lymphocytes Relative 7 (*)    All  other components within normal limits  POCT I-STAT, CHEM 8 - Abnormal; Notable for the following:    Sodium 132 (*)    Potassium 3.3 (*)    BUN 34 (*)    Creatinine, Ser 1.60 (*)    Glucose, Bld 100 (*)    All other components within normal limits  ETHANOL  PROTIME-INR  APTT  COMPREHENSIVE METABOLIC PANEL  TROPONIN I  URINE RAPID DRUG SCREEN (HOSP PERFORMED)  URINALYSIS, ROUTINE W REFLEX MICROSCOPIC   Imaging Review No results found.  EKG Interpretation   None       Date: 09/24/2013  Rate: 95  Rhythm: normal sinus rhythm  QRS Axis: right  Intervals: normal  ST/T Wave abnormalities: nonspecific ST/T changes  Conduction Disutrbances:left bundle branch block  Narrative Interpretation:   Old EKG Reviewed: none available Results for orders placed during the hospital encounter of 09/24/13  CBC      Result Value Range   WBC 9.6  4.0 - 10.5 K/uL   RBC 3.51 (*) 3.87 - 5.11 MIL/uL   Hemoglobin 11.2 (*) 12.0 - 15.0 g/dL   HCT 86.5 (*) 78.4 - 69.6 %   MCV 92.6  78.0 - 100.0 fL   MCH 31.9  26.0 - 34.0 pg   MCHC 34.5  30.0 - 36.0 g/dL   RDW 29.5  28.4 - 13.2 %   Platelets 344  150 - 400 K/uL  DIFFERENTIAL      Result Value Range  Neutrophils Relative % 88 (*) 43 - 77 %   Neutro Abs 8.5 (*) 1.7 - 7.7 K/uL   Lymphocytes Relative 7 (*) 12 - 46 %   Lymphs Abs 0.7  0.7 - 4.0 K/uL   Monocytes Relative 5  3 - 12 %   Monocytes Absolute 0.5  0.1 - 1.0 K/uL   Eosinophils Relative 0  0 - 5 %   Eosinophils Absolute 0.0  0.0 - 0.7 K/uL   Basophils Relative 0  0 - 1 %   Basophils Absolute 0.0  0.0 - 0.1 K/uL  GLUCOSE, CAPILLARY      Result Value Range   Glucose-Capillary 88  70 - 99 mg/dL  POCT I-STAT, CHEM 8      Result Value Range   Sodium 132 (*) 135 - 145 mEq/L   Potassium 3.3 (*) 3.5 - 5.1 mEq/L   Chloride 97  96 - 112 mEq/L   BUN 34 (*) 6 - 23 mg/dL   Creatinine, Ser 1.61 (*) 0.50 - 1.10 mg/dL   Glucose, Bld 096 (*) 70 - 99 mg/dL   Calcium, Ion 0.45  4.09 - 1.30 mmol/L    TCO2 23  0 - 100 mmol/L   Hemoglobin 12.2  12.0 - 15.0 g/dL   HCT 81.1  91.4 - 78.2 %  POCT I-STAT TROPONIN I      Result Value Range   Troponin i, poc 0.03  0.00 - 0.08 ng/mL   Comment 3            No results found.  MDM  No diagnosis found. Patient is a candidate for thrombolytic therapy and will be admitted by stroke service Diagnosis #1acute stroke #2 hypokalemia    Doug Sou, MD 09/24/13 1132

## 2013-09-24 NOTE — ED Notes (Signed)
Transporting pt to IR.

## 2013-09-24 NOTE — ED Notes (Signed)
First stick for interventional at 1325 Revascularization at 1520

## 2013-09-24 NOTE — Procedures (Signed)
S/P rt common carotid arteriogram followed by TICI 3  Revascularization of occluded RT MCA prox Using 10.8 mg of IA tpa and 2 passes with the 4 x 40 mm Solitaire FR retrieval device.

## 2013-09-24 NOTE — Transfer of Care (Signed)
Immediate Anesthesia Transfer of Care Note  Patient: Dorothy [name]  Procedure(s) Performed: Procedure(s): RADIOLOGY WITH ANESTHESIA (N/A)  Patient Location: ICU  Anesthesia Type:General  Level of Consciousness: sedated, unresponsive and Patient remains intubated per anesthesia plan  Airway & Oxygen Therapy: Patient remains intubated per anesthesia plan and Patient placed on Ventilator (see vital sign flow sheet for setting)  Post-op Assessment: Report given to PACU RN and Post -op Vital signs reviewed and stable  Post vital signs: Reviewed and stable  Complications: No apparent anesthesia complications

## 2013-09-24 NOTE — Anesthesia Postprocedure Evaluation (Signed)
  Anesthesia Post-op Note  Patient: Dorothy [name]  Procedure(s) Performed: Procedure(s): RADIOLOGY WITH ANESTHESIA (N/A)  Patient Location: ICU  Anesthesia Type:General  Level of Consciousness: sedated, unresponsive and Patient remains intubated per anesthesia plan  Airway and Oxygen Therapy: Patient remains intubated per anesthesia plan  Post-op Pain: unable to evaluate due to sedation  Post-op Assessment: Post-op Vital signs reviewed, Patient's Cardiovascular Status Stable and Respiratory Function Stable  Post-op Vital Signs: Reviewed and stable  Complications: No apparent anesthesia complications

## 2013-09-24 NOTE — Anesthesia Preprocedure Evaluation (Addendum)
Anesthesia Evaluation  Patient identified by MRN, date of birth, ID band Patient confused    Reviewed: Allergy & Precautions, H&P , Patient's Chart, lab work & pertinent test results, Unable to perform ROS - Chart review only  Airway Mallampati: I TM Distance: >3 FB Neck ROM: Full    Dental  (+) Edentulous Lower and Edentulous Upper   Pulmonary  + rhonchi         Cardiovascular + dysrhythmias Atrial Fibrillation Rhythm:Irregular Rate:Tachycardia     Neuro/Psych    GI/Hepatic   Endo/Other  Hypothyroidism   Renal/GU      Musculoskeletal   Abdominal   Peds  Hematology   Anesthesia Other Findings   Reproductive/Obstetrics                          Anesthesia Physical Anesthesia Plan  ASA: IV and emergent  Anesthesia Plan: General   Post-op Pain Management:    Induction: Intravenous, Rapid sequence and Cricoid pressure planned  Airway Management Planned: Oral ETT  Additional Equipment: Arterial line  Intra-op Plan:   Post-operative Plan: Post-operative intubation/ventilation  Informed Consent:   Only emergency history available and History available from chart only  Plan Discussed with: CRNA, Anesthesiologist and Surgeon  Anesthesia Plan Comments: (Acute L. MCA embolic CVA in 79 year old with h/o atrial fibrillation. No improvement with IV alteplase. Renal insufficiency Cr. 1.42  Plan GA with oral ETT, art line )     Anesthesia Quick Evaluation

## 2013-09-24 NOTE — ED Notes (Signed)
Neurology made aware of HR 130. Will order Lopressor. IR RNs at bedside.

## 2013-09-24 NOTE — Preoperative (Signed)
Beta Blockers   Reason not to administer Beta Blockers:not on beta blocker 

## 2013-09-25 ENCOUNTER — Inpatient Hospital Stay (HOSPITAL_COMMUNITY): Payer: Medicare Other

## 2013-09-25 ENCOUNTER — Encounter (HOSPITAL_COMMUNITY): Payer: Self-pay | Admitting: *Deleted

## 2013-09-25 DIAGNOSIS — I6350 Cerebral infarction due to unspecified occlusion or stenosis of unspecified cerebral artery: Secondary | ICD-10-CM

## 2013-09-25 DIAGNOSIS — J9600 Acute respiratory failure, unspecified whether with hypoxia or hypercapnia: Secondary | ICD-10-CM

## 2013-09-25 DIAGNOSIS — J9601 Acute respiratory failure with hypoxia: Secondary | ICD-10-CM | POA: Diagnosis not present

## 2013-09-25 LAB — CBC WITH DIFFERENTIAL/PLATELET
Basophils Absolute: 0 10*3/uL (ref 0.0–0.1)
Basophils Relative: 0 % (ref 0–1)
Eosinophils Absolute: 0 10*3/uL (ref 0.0–0.7)
Eosinophils Relative: 0 % (ref 0–5)
Hemoglobin: 9 g/dL — ABNORMAL LOW (ref 12.0–15.0)
Lymphs Abs: 0.9 10*3/uL (ref 0.7–4.0)
MCH: 31.3 pg (ref 26.0–34.0)
MCHC: 33.8 g/dL (ref 30.0–36.0)
Monocytes Relative: 8 % (ref 3–12)
Neutro Abs: 9.3 10*3/uL — ABNORMAL HIGH (ref 1.7–7.7)
Neutrophils Relative %: 85 % — ABNORMAL HIGH (ref 43–77)
Platelets: 275 10*3/uL (ref 150–400)
RBC: 2.88 MIL/uL — ABNORMAL LOW (ref 3.87–5.11)
RDW: 13.9 % (ref 11.5–15.5)

## 2013-09-25 LAB — GLUCOSE, CAPILLARY
Glucose-Capillary: 83 mg/dL (ref 70–99)
Glucose-Capillary: 84 mg/dL (ref 70–99)
Glucose-Capillary: 81 mg/dL (ref 70–99)

## 2013-09-25 LAB — BASIC METABOLIC PANEL
Chloride: 102 mEq/L (ref 96–112)
Creatinine, Ser: 1.03 mg/dL (ref 0.50–1.10)
GFR calc Af Amer: 58 mL/min — ABNORMAL LOW (ref >90)
GFR calc non Af Amer: 50 mL/min — ABNORMAL LOW (ref >90)
Glucose, Bld: 93 mg/dL (ref 70–99)
Potassium: 3.1 mEq/L — ABNORMAL LOW (ref 3.5–5.1)
Sodium: 139 mEq/L (ref 135–145)

## 2013-09-25 LAB — BLOOD GAS, ARTERIAL
Bicarbonate: 22.1 mEq/L (ref 20.0–24.0)
MECHVT: 400 mL
PEEP: 5 cmH2O
Patient temperature: 98.6
pCO2 arterial: 30.5 mmHg — ABNORMAL LOW (ref 35.0–45.0)
pH, Arterial: 7.474 — ABNORMAL HIGH (ref 7.350–7.450)

## 2013-09-25 LAB — HEMOGLOBIN A1C
Hgb A1c MFr Bld: 6.1 % — ABNORMAL HIGH (ref <5.7)
Mean Plasma Glucose: 128 mg/dL — ABNORMAL HIGH (ref <117)

## 2013-09-25 LAB — LIPID PANEL: Cholesterol: 109 mg/dL (ref 0–200)

## 2013-09-25 MED ORDER — PNEUMOCOCCAL VAC POLYVALENT 25 MCG/0.5ML IJ INJ
0.5000 mL | INJECTION | INTRAMUSCULAR | Status: DC
  Filled 2013-09-25: qty 0.5

## 2013-09-25 MED ORDER — FENTANYL CITRATE 0.05 MG/ML IJ SOLN: 12.5000 ug | INTRAMUSCULAR | Status: DC | PRN

## 2013-09-25 MED ORDER — POTASSIUM CHLORIDE 10 MEQ/100ML IV SOLN
10.0000 meq | INTRAVENOUS | Status: AC
  Administered 2013-09-25 (×3): 10 meq via INTRAVENOUS
  Filled 2013-09-25 (×3): qty 100

## 2013-09-25 MED ORDER — WHITE PETROLATUM GEL
Status: AC
  Administered 2013-09-25: 15:00:00
  Filled 2013-09-25: qty 5

## 2013-09-25 MED ORDER — ASPIRIN 325 MG PO TABS
325.0000 mg | ORAL_TABLET | Freq: Every day | ORAL | Status: DC
  Filled 2013-09-25: qty 1

## 2013-09-25 MED ORDER — ENOXAPARIN SODIUM 30 MG/0.3ML ~~LOC~~ SOLN
30.0000 mg | SUBCUTANEOUS | Status: DC
  Administered 2013-09-25 – 2013-10-01 (×6): 30 mg via SUBCUTANEOUS
  Filled 2013-09-25 (×8): qty 0.3

## 2013-09-25 MED ORDER — ASPIRIN 300 MG RE SUPP
300.0000 mg | Freq: Once | RECTAL | Status: AC
  Administered 2013-09-25: 300 mg via RECTAL

## 2013-09-25 MED ORDER — INFLUENZA VAC SPLIT QUAD 0.5 ML IM SUSP
0.5000 mL | INTRAMUSCULAR | Status: DC
  Filled 2013-09-25: qty 0.5

## 2013-09-25 NOTE — Progress Notes (Signed)
  CT Head Wo Contrast  09/25/2013  IMPRESSION:   No intracranial hemorrhage.  Right middle cerebral artery distribution infarct is better  visualize with indistinctness of the gray white junction of the  right frontal operculum region and indistinctness of the right  lenticular nucleus/right caudate head.  Delton See PA-C [facility] Pager [phone] 09/25/2013, 5:08 PM

## 2013-09-25 NOTE — Progress Notes (Addendum)
Stroke Team Progress Note  HISTORY 79 y.o. female with a past medical history significant for atrial fibrillation, brought to [facility] ED by ambulance as a code stroke.  She was last known well at 9 am today, talking to her husband and suddenly became less responsive, fell to the floor, and was EMS arrived shew was not able to move the left side and had dysarthria.  Never had similar symptoms before.  Initial NIHSS 19.  CT brain revealed questionable ill defined area of hypodensity left MCA territory as well as question minimal hyperdensity of the right carotid terminus/M1 segment right middle cerebral artery raises the possibility of thrombus at this level.  She is alert and awake, following commands, and started improving in then ED after receiving the IV tpa bolus.  Pt is s/p intervention with revascularization of R MCA. Intubated for airway protection.    SUBJECTIVE Sedated on propofol.   OBJECTIVE Most recent Vital Signs: Filed Vitals:   09/25/13 0800 09/25/13 0815 09/25/13 0900 09/25/13 1000  BP: 106/48  102/47 102/50  Pulse: 76 80 73 77  Temp: 98.2 F (36.8 C) 98.2 F (36.8 C) 97.2 F (36.2 C) 91.9 F (33.3 C)  TempSrc: Core (Comment)     Resp: 16 20 15 15   Height:      Weight:      SpO2: 100% 100% 100% 100%   CBG (last 3)   Recent Labs  09/25/13 0231 09/25/13 0432 09/25/13 0750  GLUCAP 87 83 84    IV Fluid Intake:   . sodium chloride 75 mL/hr at 09/24/13 1140  . sodium chloride    . diltiazem (CARDIZEM) infusion Stopped (09/24/13 1830)  . niCARDipine    . propofol 15 mcg/kg/min (09/25/13 0815)    MEDICATIONS  . chlorhexidine  15 mL Mouth/Throat BID  . diltiazem  5 mg Intravenous Once  . nitroGLYCERIN  100 mcg Intra-arterial to XRAY  . pantoprazole (PROTONIX) IV  40 mg Intravenous QHS   PRN:  acetaminophen, acetaminophen, fentaNYL, labetalol, ondansetron (ZOFRAN) IV, senna-docusate  Diet:  NPO  Activity:  Bedrest DVT Prophylaxis:  SCDs  CLINICALLY  SIGNIFICANT STUDIES Basic Metabolic Panel:  Recent Labs Lab 09/24/13 1105 09/24/13 1120 09/24/13 1453 09/25/13 0520  NA 131* 132* 134* 139  K 3.2* 3.3* 2.6* 3.1*  CL 91* 97  --  102  CO2 21  --   --  21  GLUCOSE 99 100*  --  93  BUN 37* 34*  --  24*  CREATININE 1.42* 1.60*  --  1.03  CALCIUM 9.1  --   --  8.0*   Liver Function Tests:  Recent Labs Lab 09/24/13 1105  AST 89*  ALT 91*  ALKPHOS 115  BILITOT 0.6  PROT 6.4  ALBUMIN 2.8*   CBC:  Recent Labs Lab 09/24/13 1800 09/25/13 0520  WBC 9.8 11.0*  NEUTROABS 8.6* 9.3*  HGB 9.4* 9.0*  HCT 27.9* 26.6*  MCV 92.1 92.4  PLT 293 275   Coagulation:  Recent Labs Lab 09/24/13 1105  LABPROT 14.3  INR 1.13   Cardiac Enzymes:  Recent Labs Lab 09/24/13 1105  TROPONINI <0.30   Urinalysis:  Recent Labs Lab 09/24/13 1143  COLORURINE YELLOW  LABSPEC 1.020  PHURINE 5.5  GLUCOSEU NEGATIVE  HGBUR NEGATIVE  BILIRUBINUR SMALL*  KETONESUR 15*  PROTEINUR NEGATIVE  UROBILINOGEN 1.0  NITRITE NEGATIVE  LEUKOCYTESUR NEGATIVE   Lipid Panel    Component Value Date/Time   CHOL 109 09/25/2013 0520   TRIG 78 09/25/2013  0520   HDL 26* 09/25/2013 0520   CHOLHDL 4.2 09/25/2013 0520   VLDL 16 09/25/2013 0520   LDLCALC 67 09/25/2013 0520   HgbA1C  No results found for this basename: HGBA1C    Urine Drug Screen:     Component Value Date/Time   LABOPIA NONE DETECTED 09/24/2013 1143   COCAINSCRNUR NONE DETECTED 09/24/2013 1143   LABBENZ NONE DETECTED 09/24/2013 1143   AMPHETMU NONE DETECTED 09/24/2013 1143   THCU NONE DETECTED 09/24/2013 1143   LABBARB NONE DETECTED 09/24/2013 1143    Alcohol Level:  Recent Labs Lab 09/24/13 1105  ETH <11    Ct Head Wo Contrast  09/24/2013   CLINICAL DATA:  Status post interventional procedure.  EXAM: CT HEAD WITHOUT CONTRAST  TECHNIQUE: Contiguous axial images were obtained from the base of the skull through the vertex without intravenous contrast.  COMPARISON:  Earlier today.   FINDINGS: Mildly progressive ill-defined low density in a right middle cerebral artery distribution. No intracranial hemorrhage or mass-effect. Stable mildly enlarged ventricles and subarachnoid spaces. Unremarkable bones and included paranasal sinuses.  IMPRESSION: Mild evolution of a right middle cerebral artery distribution acute infarct. No intracranial hemorrhage.   Electronically Signed   By: Gordan Payment M.D.   On: 09/24/2013 17:31   Ct Head Wo Contrast  09/24/2013   CLINICAL DATA:  Left-sided paralysis.  Difficulty speaking.  EXAM: CT HEAD WITHOUT CONTRAST  TECHNIQUE: Contiguous axial images were obtained from the base of the skull through the vertex without intravenous contrast.  COMPARISON:  04/08/2011.  FINDINGS: Indistinct appearance of the right basal ganglia, right caudate, right subinsular region, right peri operculum region and portion of the right frontal lobe suggestive of a right middle cerebral artery acute infarct involving over 1/3 of the distribution of the right middle cerebral artery.  Question minimal hyperdensity of the right carotid terminus/M1 segment right middle cerebral artery raises the possibility of thrombus at this level.  No intracranial hemorrhage currently noted.  No intracranial mass lesion noted on this unenhanced exam.  Vascular calcifications.  IMPRESSION: Indistinct appearance of the right basal ganglia, right caudate, right subinsular region, right peri operculum region and portion of the right frontal lobe suggestive of a right middle cerebral artery acute infarct involving over 1/3 of the distribution of the right middle cerebral artery.  Question minimal hyperdensity of the right carotid terminus/M1 segment right middle cerebral artery raises the possibility of thrombus at this level.  No intracranial hemorrhage currently noted.  These results were called by telephone at the time of interpretation on 09/24/2013 at 11:24 AM to Dr. Cyril Mourning, who verbally acknowledged  these results.   Electronically Signed   By: Bridgett Larsson M.D.   On: 09/24/2013 11:31   Dg Chest Port 1 View  09/24/2013   CLINICAL DATA:  Status post stroke  EXAM: PORTABLE CHEST - 1 VIEW  COMPARISON:  09/01/2013  FINDINGS: Endotracheal tube tip is in satisfactory position above the carina. The nasogastric tube tip is in the stomach with side port below GE junction. Normal heart size. There is moderate diffuse interstitial edema which is new from the previous examination.  IMPRESSION: 1. Status post intubation.  Tip of tube is of above the carina. 2. Moderate pulmonary edema.   Electronically Signed   By: Signa Kell M.D.   On: 09/24/2013 18:36    CT of the brain  Mild evolution of a right middle cerebral artery distribution acute  infarct. No intracranial hemorrhage.  MRI of the  brain    MRA of the brain    2D Echocardiogram    Carotid Doppler    CXR    EKG  sinuts   Therapy Recommendations pending  Physical Exam  Mental Status:  Sedated on popofol Cranial Nerves:  II: Discs flat bilaterally; left visual filed defect, pupils equal, round, reactive to light and accommodation  III,IV, VI: ptosis not present, right gaze preference.  V,VII: smile asymmetric with left face weakness, facial light touch sensation normal bilaterally  VIII: hearing normal bilaterally  IX,X: gag reflex present  XI: bilateral shoulder shrug  XII: midline tongue extension without atrophy or fasciculations  Motor:  Left sided hemiplegia Tone and bulk:normal tone throughout; no atrophy noted  Sensory: Pinprick and light touch intact throughout, bilaterally  Deep Tendon Reflexes:  Right: Upper Extremity Left: Upper extremity  biceps (C-5 to C-6) 2/4 biceps (C-5 to C-6) 2/4  tricep (C7) 2/4 triceps (C7) 2/4  Brachioradialis (C6) 2/4 Brachioradialis (C6) 2/4  Lower Extremity Lower Extremity  quadriceps (L-2 to L-4) 2/4 quadriceps (L-2 to L-4) 2/4  Achilles (S1) 2/4 Achilles (S1) 2/4  Plantars:   Right: downgoing Left: upgoing  Cerebellar:  normal finger-to-nose, normal heel-to-shin test  Gait:  No tested.   ASSESSMENT 79 y.o. female with probable embolic right MCA ischemic infarct. NIHSS 19. CT brain with questionable, ill defined hypodensity right MCA territory and, s/p intervention with recanalization of RMCA. Pt intubated for airway protection and started on propofol    Hospital day # 1  TREATMENT/PLAN  ASA 325/lovenox 24 hrs post intervention   Echo   Carotids  CTH 24 hrs post intervention and if no hemorrhage can start ASA/Lovenox.   Tele  Pt/ot  Speech  Potassium supplementation.   Groin sheath removal today.   D/w family at bedside  Pauletta Browns  09/25/2013 10:14 AM  I have personally obtained a history, examined the patient, evaluated imaging results, and formulated the assessment and plan of care. I agree with the above.

## 2013-09-25 NOTE — Progress Notes (Signed)
PT Cancellation Note  Patient Details Name: Dorothy [name] MRN: [id number] DOB: 06-22-1931   Cancelled Treatment:    Reason Eval/Treat Not Completed: Medical issues which prohibited therapy;Patient not medically ready.  Patient currently on vent.  Will hold PT today and continue to monitor until appropriate for PT evaluation.  Thank you.   Vena Austria 09/25/2013, 10:50 AM Durenda Hurt. Renaldo Fiddler, [facility] Acute Rehab Services Pager [phone]

## 2013-09-25 NOTE — Progress Notes (Signed)
1 Day Post-Op  Subjective: CVA R MCA revascularization- IAtpa; clot retrieval 12/6 vent  Objective: Vital signs in last 24 hours: Temp:  [85.8 F (29.9 C)-99.1 F (37.3 C)] 91.9 F (33.3 C) (12/07 1000) Pulse Rate:  [57-142] 77 (12/07 1000) Resp:  [14-24] 15 (12/07 1000) BP: (92-136)/(47-77) 102/50 mmHg (12/07 1000) SpO2:  [79 %-100 %] 100 % (12/07 1000) Arterial Line BP: (62-139)/(36-77) 124/47 mmHg (12/07 1000) FiO2 (%):  [30 %-60 %] 30 % (12/07 1000) Weight:  [100 lb (45.36 kg)-109 lb 12.6 oz (49.8 kg)] 108 lb 0.4 oz (49 kg) (12/07 0400)    Intake/Output from previous day: 12/06 0701 - 12/07 0700 In: 3742.5 [I.V.:3292.5; IV Piggyback:450] Out: 795 [Urine:795] Intake/Output this shift: Total I/O In: 239 [I.V.:239] Out: 150 [Urine:150]  PE:  Afeb; vss On vent Moves Right well to command- both upper and lower extr Moves Left leg to command- no movement on Lt hand Rt groin: sheath intact; no bleeding; no hematoma Rt foot 2+ pulses  Lab Results:   Recent Labs  09/24/13 1800 09/25/13 0520  WBC 9.8 11.0*  HGB 9.4* 9.0*  HCT 27.9* 26.6*  PLT 293 275   BMET  Recent Labs  09/24/13 1105 09/24/13 1120 09/24/13 1453 09/25/13 0520  NA 131* 132* 134* 139  K 3.2* 3.3* 2.6* 3.1*  CL 91* 97  --  102  CO2 21  --   --  21  GLUCOSE 99 100*  --  93  BUN 37* 34*  --  24*  CREATININE 1.42* 1.60*  --  1.03  CALCIUM 9.1  --   --  8.0*   PT/INR  Recent Labs  09/24/13 1105  LABPROT 14.3  INR 1.13   ABG  Recent Labs  09/24/13 2125 09/25/13 0430  PHART 7.506* 7.474*  HCO3 22.1 22.1    Studies/Results: Ct Head Wo Contrast  09/24/2013   CLINICAL DATA:  Status post interventional procedure.  EXAM: CT HEAD WITHOUT CONTRAST  TECHNIQUE: Contiguous axial images were obtained from the base of the skull through the vertex without intravenous contrast.  COMPARISON:  Earlier today.  FINDINGS: Mildly progressive ill-defined low density in a right middle cerebral artery  distribution. No intracranial hemorrhage or mass-effect. Stable mildly enlarged ventricles and subarachnoid spaces. Unremarkable bones and included paranasal sinuses.  IMPRESSION: Mild evolution of a right middle cerebral artery distribution acute infarct. No intracranial hemorrhage.   Electronically Signed   By: Gordan Payment M.D.   On: 09/24/2013 17:31   Ct Head Wo Contrast  09/24/2013   CLINICAL DATA:  Left-sided paralysis.  Difficulty speaking.  EXAM: CT HEAD WITHOUT CONTRAST  TECHNIQUE: Contiguous axial images were obtained from the base of the skull through the vertex without intravenous contrast.  COMPARISON:  04/08/2011.  FINDINGS: Indistinct appearance of the right basal ganglia, right caudate, right subinsular region, right peri operculum region and portion of the right frontal lobe suggestive of a right middle cerebral artery acute infarct involving over 1/3 of the distribution of the right middle cerebral artery.  Question minimal hyperdensity of the right carotid terminus/M1 segment right middle cerebral artery raises the possibility of thrombus at this level.  No intracranial hemorrhage currently noted.  No intracranial mass lesion noted on this unenhanced exam.  Vascular calcifications.  IMPRESSION: Indistinct appearance of the right basal ganglia, right caudate, right subinsular region, right peri operculum region and portion of the right frontal lobe suggestive of a right middle cerebral artery acute infarct involving over 1/3 of the  distribution of the right middle cerebral artery.  Question minimal hyperdensity of the right carotid terminus/M1 segment right middle cerebral artery raises the possibility of thrombus at this level.  No intracranial hemorrhage currently noted.  These results were called by telephone at the time of interpretation on 09/24/2013 at 11:24 AM to Dr. Cyril Mourning, who verbally acknowledged these results.   Electronically Signed   By: Bridgett Larsson M.D.   On: 09/24/2013 11:31    Dg Chest Port 1 View  09/24/2013   CLINICAL DATA:  Status post stroke  EXAM: PORTABLE CHEST - 1 VIEW  COMPARISON:  09/01/2013  FINDINGS: Endotracheal tube tip is in satisfactory position above the carina. The nasogastric tube tip is in the stomach with side port below GE junction. Normal heart size. There is moderate diffuse interstitial edema which is new from the previous examination.  IMPRESSION: 1. Status post intubation.  Tip of tube is of above the carina. 2. Moderate pulmonary edema.   Electronically Signed   By: Signa Kell M.D.   On: 09/24/2013 18:36    Anti-infectives: Anti-infectives   Start     Dose/Rate Route Frequency Ordered Stop   09/24/13 1312  ceFAZolin (ANCEF) 1-5 GM-% IVPB    Comments:  Tillman, Belinda   : cabinet override      09/24/13 1312 09/24/13 1326   09/24/13 1300  ceFAZolin (ANCEF) 2 g in dextrose 5 % 50 mL IVPB     2 g 140 mL/hr over 30 Minutes Intravenous  Once 09/24/13 1248 09/24/13 1330      Assessment/Plan: s/p Procedure(s): RADIOLOGY WITH ANESTHESIA (N/A)  CVA R MCA revacs with Dr Corliss Skains 12/7 IA tpa/clot retrieval Will follow   LOS: 1 day    Byren Pankow A 09/25/2013

## 2013-09-25 NOTE — Progress Notes (Signed)
Pt. transported on vent to CT without incident, returned to wean 5/8, fi02 <'d to 30% once back to 16M ,extubation planned for 1600, order in, RN aware.

## 2013-09-25 NOTE — Progress Notes (Signed)
PULMONARY  / CRITICAL CARE MEDICINE CONSULTATION  Name: Dorothy [name] MRN: [id number] DOB: [birth year]    ADMISSION DATE:  09/24/2013  CHIEF COMPLAINT:  Stroke   BRIEF PATIENT DESCRIPTION: 79 yo female with atrial fibrillation who presented with left hemiparesis and dysarthria, now status post cerebral angiogram with revascularization of occluded right MCA.  SIGNIFICANT EVENTS / STUDIES:  1. CT Head 09/24/2013 11:34 - suggestive of R MCA acute infarct, no intracranial hemorrhage 2. Alteplase 09/24/2013 11:45 3. IR R common carotid arteriogram, revascularization of occluded R MCA with tPA and 2 passes with retrieval device 09/24/2013 4. CT Head 09/24/2013 17:34 - mild evolution R MCA acute infarct, no intracranial hemorrhage 5. Intubation for procedure 09/24/2013  LINES / TUBES: 1. ETT 09/24/2013 >>> 12/07  2. L radial art line 09/24/2013 >>> 3. R femoral art line 09/24/2013 >>> 12/07   CULTURES: None  ANTIBIOTICS: 1. Cefazolin 2 g IV x 1 09/24/2013  SUBJ: sedated. Passed SBT.   PHYSICAL EXAM  VITAL SIGNS: Temp:  [91.9 F (33.3 C)-99.1 F (37.3 C)] 97.5 F (36.4 C) (12/07 2000) Pulse Rate:  [68-116] 84 (12/07 2000) Resp:  [13-23] 17 (12/07 2000) BP: (93-136)/(47-67) 122/67 mmHg (12/07 2000) SpO2:  [80 %-100 %] 100 % (12/07 2000) Arterial Line BP: (80-147)/(38-77) 143/54 mmHg (12/07 2000) FiO2 (%):  [30 %] 30 % (12/07 1642) Weight:  [49 kg (108 lb 0.4 oz)] 49 kg (108 lb 0.4 oz) (12/07 0400)   VENTILATOR SETTINGS: Vent Mode:  [-] PSV;CPAP FiO2 (%):  [30 %] 30 % Set Rate:  [10 bmp] 10 bmp Vt Set:  [400 mL] 400 mL PEEP:  [5 cmH20] 5 cmH20 Pressure Support:  [5 cmH20-8 cmH20] 8 cmH20 Plateau Pressure:  [12 cmH20-13 cmH20] 12 cmH20  INTAKE / OUTPUT: Intake/Output     12/07 0701 - 12/08 0700   I.V. (mL/kg) 464.4 (9.5)   IV Piggyback 300   Total Intake(mL/kg) 764.4 (15.6)   Urine (mL/kg/hr) 480 (0.7)   Total Output 480   Net +284.4         PHYSICAL  EXAMINATION: General: RASS -2, not F/C HEENT: NCAT, EOMI Neck:  No JVD noted  Cardiovascular:  Irregularly irregular, no M  Lungs:  bilateral rhonchi anteriorly Abdomen:  +BS, soft, nontender, nondistended Musculoskeletal:  No clubbing or cyanosis  Skin:  No rash noted  LABS:  CBC Recent Labs     09/24/13  1105   09/24/13  1453  09/24/13  1800  09/25/13  0520  WBC  9.6   --    --   9.8  11.0*  HGB  11.2*   < >  8.5*  9.4*  9.0*  HCT  32.5*   < >  25.0*  27.9*  26.6*  PLT  344   --    --   293  275   < > = values in this interval not displayed.    Coag's Recent Labs     09/24/13  1105  APTT  28  INR  1.13    BMET Recent Labs     09/24/13  1105  09/24/13  1120  09/24/13  1453  09/25/13  0520  NA  131*  132*  134*  139  K  3.2*  3.3*  2.6*  3.1*  CL  91*  97   --   102  CO2  21   --    --   21  BUN  37*  34*   --  24*  CREATININE  1.42*  1.60*   --   1.03  GLUCOSE  99  100*   --   93    Electrolytes Recent Labs     09/24/13  1105  09/25/13  0520  CALCIUM  9.1  8.0*    Sepsis Markers No results found for this basename: LACTICACIDVEN, PROCALCITON, O2SATVEN,  in the last 72 hours  ABG Recent Labs     09/24/13  1453  09/24/13  2125  09/25/13  0430  PHART  7.258*  7.506*  7.474*  PCO2ART  39.9  28.2*  30.5*  PO2ART  151.0*  256.0*  109.0*    Liver Enzymes Recent Labs     09/24/13  1105  AST  89*  ALT  91*  ALKPHOS  115  BILITOT  0.6  ALBUMIN  2.8*    Cardiac Enzymes Recent Labs     09/24/13  1105  TROPONINI  <0.30    Glucose Recent Labs     09/24/13  1129  09/24/13  1707  09/24/13  2013  09/25/13  0231  09/25/13  0432  09/25/13  0750  GLUCAP  88  150*  152*  87  83  84    Imaging Ct Head Wo Contrast  09/25/2013   CLINICAL DATA:  Post tPA for left-sided weakness.  EXAM: CT HEAD WITHOUT CONTRAST  TECHNIQUE: Contiguous axial images were obtained from the base of the skull through the vertex without intravenous contrast.   COMPARISON:  09/24/2013 CT and catheter angiogram.  04/08/2011 CT  FINDINGS: No intracranial hemorrhage.  Right middle cerebral artery distribution infarct is better visualize with indistinctness of the gray white junction of the right frontal operculum region and indistinctness of the right lenticular nucleus/right caudate head.  No intracranial mass lesion noted on this unenhanced exam.  IMPRESSION: No intracranial hemorrhage.  Right middle cerebral artery distribution infarct is better visualize with indistinctness of the gray white junction of the right frontal operculum region and indistinctness of the right lenticular nucleus/right caudate head.   Electronically Signed   By: Bridgett Larsson M.D.   On: 09/25/2013 12:38   Ct Head Wo Contrast  09/24/2013   CLINICAL DATA:  Status post interventional procedure.  EXAM: CT HEAD WITHOUT CONTRAST  TECHNIQUE: Contiguous axial images were obtained from the base of the skull through the vertex without intravenous contrast.  COMPARISON:  Earlier today.  FINDINGS: Mildly progressive ill-defined low density in a right middle cerebral artery distribution. No intracranial hemorrhage or mass-effect. Stable mildly enlarged ventricles and subarachnoid spaces. Unremarkable bones and included paranasal sinuses.  IMPRESSION: Mild evolution of a right middle cerebral artery distribution acute infarct. No intracranial hemorrhage.   Electronically Signed   By: Gordan Payment M.D.   On: 09/24/2013 17:31   Ct Head Wo Contrast  09/24/2013   CLINICAL DATA:  Left-sided paralysis.  Difficulty speaking.  EXAM: CT HEAD WITHOUT CONTRAST  TECHNIQUE: Contiguous axial images were obtained from the base of the skull through the vertex without intravenous contrast.  COMPARISON:  04/08/2011.  FINDINGS: Indistinct appearance of the right basal ganglia, right caudate, right subinsular region, right peri operculum region and portion of the right frontal lobe suggestive of a right middle cerebral artery  acute infarct involving over 1/3 of the distribution of the right middle cerebral artery.  Question minimal hyperdensity of the right carotid terminus/M1 segment right middle cerebral artery raises the possibility of thrombus at this level.  No intracranial hemorrhage currently noted.  No intracranial mass lesion noted on this unenhanced exam.  Vascular calcifications.  IMPRESSION: Indistinct appearance of the right basal ganglia, right caudate, right subinsular region, right peri operculum region and portion of the right frontal lobe suggestive of a right middle cerebral artery acute infarct involving over 1/3 of the distribution of the right middle cerebral artery.  Question minimal hyperdensity of the right carotid terminus/M1 segment right middle cerebral artery raises the possibility of thrombus at this level.  No intracranial hemorrhage currently noted.  These results were called by telephone at the time of interpretation on 09/24/2013 at 11:24 AM to Dr. Cyril Mourning, who verbally acknowledged these results.   Electronically Signed   By: Bridgett Larsson M.D.   On: 09/24/2013 11:31   Dg Chest Port 1 View  09/24/2013   CLINICAL DATA:  Status post stroke  EXAM: PORTABLE CHEST - 1 VIEW  COMPARISON:  09/01/2013  FINDINGS: Endotracheal tube tip is in satisfactory position above the carina. The nasogastric tube tip is in the stomach with side port below GE junction. Normal heart size. There is moderate diffuse interstitial edema which is new from the previous examination.  IMPRESSION: 1. Status post intubation.  Tip of tube is of above the carina. 2. Moderate pulmonary edema.   Electronically Signed   By: Signa Kell M.D.   On: 09/24/2013 18:36     CXR: personally reviewed  ASSESSMENT / PLAN: Active Problems:   Stroke with cerebral ischemia   VDRF post angiogram 79 yo female with atrial fibrillation, presenting with R MCA stroke s/p tPA and R common carotid arteriogram with revascularization of prox R MCA, now  on mechanical ventilation post-procedure.   Extubate 12/07. Post extubation check: pt sedate but with spont cough and no distress Rest of ICU medical plan per Stroke team   Billy Fischer, MD ; [facility] service Mobile [phone].  After 5:30 PM or weekends, call [phone]

## 2013-09-25 NOTE — Progress Notes (Signed)
Pulled right 9 fr sheath:  No hematoma present before pull, pulled at 1050, held manual pressure with Vpad for 30 minutes. No complications. Verified site with Renae Fickle, RN.  Pressure dressing applied with a 10 lb sandbag.    Lynann Beaver RT R Juliet King-Cushman RT R

## 2013-09-25 NOTE — Progress Notes (Addendum)
SLP Cancellation Note  Patient Details Name: Dorothy [name] MRN: [id number] DOB: Jun 10, 1931   Cancelled treatment: ST received order for SLE.    Unable to complete as currently on vent.  ST to f/u on 09/26/13.   Moreen Fowler MS, CCC-SLP [phone] [facility] 09/25/2013, 10:10 AM

## 2013-09-25 NOTE — Procedures (Signed)
Extubation Procedure Note  Patient Details:   Name: Dorothy [name] DOB: Sep 27, 1931 MRN: [id number]   Airway Documentation:     Evaluation  O2 sats: stable throughout Complications: No apparent complications Patient did tolerate procedure well. Bilateral Breath Sounds: Rhonchi (cleared with sx.'ing) Suctioning: Airway Yes, Pt. holding head off bed on own, NIF -20, FVC-1250, + cuff leak, placed on 2 lpm n/c, tolerated procedure well, able to verbalize "HELLO", no Stridor noted, RT to monitor.   Joylene John 09/25/2013, 5:33 PM

## 2013-09-26 ENCOUNTER — Inpatient Hospital Stay (HOSPITAL_COMMUNITY): Payer: Medicare Other

## 2013-09-26 DIAGNOSIS — E876 Hypokalemia: Secondary | ICD-10-CM | POA: Diagnosis present

## 2013-09-26 DIAGNOSIS — I369 Nonrheumatic tricuspid valve disorder, unspecified: Secondary | ICD-10-CM

## 2013-09-26 LAB — BASIC METABOLIC PANEL
BUN: 20 mg/dL (ref 6–23)
BUN: 21 mg/dL (ref 6–23)
CO2: 15 mEq/L — ABNORMAL LOW (ref 19–32)
Calcium: 8.7 mg/dL (ref 8.4–10.5)
Chloride: 106 mEq/L (ref 96–112)
Creatinine, Ser: 0.9 mg/dL (ref 0.50–1.10)
GFR calc Af Amer: 68 mL/min — ABNORMAL LOW (ref >90)
GFR calc Af Amer: 71 mL/min — ABNORMAL LOW (ref >90)
GFR calc non Af Amer: 61 mL/min — ABNORMAL LOW (ref >90)
Glucose, Bld: 99 mg/dL (ref 70–99)
Potassium: 3.6 mEq/L (ref 3.5–5.1)
Potassium: 4.3 mEq/L (ref 3.5–5.1)
Sodium: 141 mEq/L (ref 135–145)
Sodium: 141 mEq/L (ref 135–145)

## 2013-09-26 LAB — CBC
HCT: 30.1 % — ABNORMAL LOW (ref 36.0–46.0)
Hemoglobin: 9.8 g/dL — ABNORMAL LOW (ref 12.0–15.0)
MCH: 30.4 pg (ref 26.0–34.0)
MCHC: 32.6 g/dL (ref 30.0–36.0)
RDW: 14.5 % (ref 11.5–15.5)
WBC: 11.2 10*3/uL — ABNORMAL HIGH (ref 4.0–10.5)

## 2013-09-26 LAB — HEPATIC FUNCTION PANEL
AST: 109 U/L — ABNORMAL HIGH (ref 0–37)
Albumin: 2.4 g/dL — ABNORMAL LOW (ref 3.5–5.2)
Bilirubin, Direct: 0.2 mg/dL (ref 0.0–0.3)
Total Bilirubin: 0.5 mg/dL (ref 0.3–1.2)
Total Protein: 5.8 g/dL — ABNORMAL LOW (ref 6.0–8.3)

## 2013-09-26 LAB — PHOSPHORUS: Phosphorus: 2.6 mg/dL (ref 2.3–4.6)

## 2013-09-26 LAB — TSH: TSH: 1.676 u[IU]/mL (ref 0.350–4.500)

## 2013-09-26 LAB — GLUCOSE, CAPILLARY: Glucose-Capillary: 106 mg/dL — ABNORMAL HIGH (ref 70–99)

## 2013-09-26 LAB — MAGNESIUM: Magnesium: 2 mg/dL (ref 1.5–2.5)

## 2013-09-26 MED ORDER — AMIODARONE LOAD VIA INFUSION
150.0000 mg | Freq: Once | INTRAVENOUS | Status: AC
  Administered 2013-09-26: 150 mg via INTRAVENOUS
  Filled 2013-09-26: qty 83.34

## 2013-09-26 MED ORDER — AMIODARONE HCL IN DEXTROSE 360-4.14 MG/200ML-% IV SOLN
60.0000 mg/h | INTRAVENOUS | Status: AC
  Administered 2013-09-26 (×2): 60 mg/h via INTRAVENOUS
  Filled 2013-09-26: qty 200

## 2013-09-26 MED ORDER — ATORVASTATIN CALCIUM 20 MG PO TABS
20.0000 mg | ORAL_TABLET | Freq: Every day | ORAL | Status: DC
  Administered 2013-09-27 – 2013-10-03 (×7): 20 mg via ORAL
  Filled 2013-09-26 (×9): qty 1

## 2013-09-26 MED ORDER — SODIUM CHLORIDE 0.9 % IV SOLN
INTRAVENOUS | Status: DC
  Administered 2013-09-26: 21:00:00 via INTRAVENOUS
  Administered 2013-09-27: 45 mL/h via INTRAVENOUS
  Administered 2013-09-29 – 2013-10-01 (×3): via INTRAVENOUS

## 2013-09-26 MED ORDER — ASPIRIN 300 MG RE SUPP
300.0000 mg | Freq: Every day | RECTAL | Status: DC
  Administered 2013-09-26 – 2013-09-29 (×4): 300 mg via RECTAL
  Filled 2013-09-26 (×6): qty 1

## 2013-09-26 MED ORDER — AMIODARONE HCL IN DEXTROSE 360-4.14 MG/200ML-% IV SOLN
30.0000 mg/h | INTRAVENOUS | Status: DC
  Administered 2013-09-27 – 2013-09-29 (×4): 30 mg/h via INTRAVENOUS
  Filled 2013-09-26 (×13): qty 200

## 2013-09-26 NOTE — Evaluation (Signed)
Clinical/Bedside Swallow Evaluation Patient Details  Name: Dorothy [name] MRN: [id number] Date of Birth: Dec 16, 1930  Today's Date: 09/26/2013 Time: 0810-0833 SLP Time Calculation (min): 23 min  Past Medical History:  Past Medical History  Diagnosis Date  . Stroke   . Atrial fibrillation    Past Surgical History: History reviewed. No pertinent past surgical history. HPI:  79 yo female with atrial fibrillation who presented with left hemiparesis and dysarthria, now status post cerebral angiogram with revascularization of occluded right MCA.   Assessment / Plan / Recommendation Clinical Impression  Pt demosntrates evidence of impaired swallow function including excessive multiple swallows with thin and puree textures (20-30 swallows), throat clear/gasp with swallow and watery eyes. Suspect decreased airway protection and significnat residuals resulting from left sided weakness. Pt is still significantly lethargic and is one day post extubation. Would not yet recommend objective testing as pts function is likely to improve over the 1-2 days. Will f/u with PO trials and when pt more alert, likely complete MBS prior to initiating diet.     Aspiration Risk  Severe    Diet Recommendation NPO   Medication Administration: Via alternative means    Other  Recommendations Oral Care Recommendations: Oral care Q4 per protocol   Follow Up Recommendations  Inpatient Rehab    Frequency and Duration min 2x/week  2 weeks   Pertinent Vitals/Pain HR into 140s at rest    SLP Swallow Goals     Swallow Study Prior Functional Status       General HPI: 79 yo female with atrial fibrillation who presented with left hemiparesis and dysarthria, now status post cerebral angiogram with revascularization of occluded right MCA. Type of Study: Bedside swallow evaluation Previous Swallow Assessment: none in chart Diet Prior to this Study: NPO Temperature Spikes Noted: No Respiratory Status: Nasal  cannula History of Recent Intubation: Yes Length of Intubations (days): 2 days Date extubated: 09/25/13 Behavior/Cognition: Lethargic;Cooperative Oral Cavity - Dentition: Edentulous Self-Feeding Abilities: Needs assist Patient Positioning: Upright in bed Baseline Vocal Quality: Clear Volitional Cough: Weak Volitional Swallow: Able to elicit    Oral/Motor/Sensory Function Overall Oral Motor/Sensory Function: Impaired Labial ROM: Reduced left Labial Symmetry: Abnormal symmetry left Labial Strength: Reduced Labial Sensation: Reduced Lingual ROM: Within Functional Limits Lingual Symmetry: Within Functional Limits Lingual Strength: Reduced Facial ROM: Within Functional Limits Facial Symmetry: Within Functional Limits Facial Strength: Within Functional Limits Facial Sensation: Within Functional Limits Velum: Within Functional Limits Mandible: Within Functional Limits   Ice Chips Ice chips: Impaired Presentation: Spoon Pharyngeal Phase Impairments:  (multiple swallows)   Thin Liquid Thin Liquid: Impaired Presentation: Cup Pharyngeal  Phase Impairments: Multiple swallows;Throat Clearing - Immediate (watery eyes)    Nectar Thick Nectar Thick Liquid: Not tested   Honey Thick Honey Thick Liquid: Not tested   Puree Puree: Impaired Presentation: Spoon Pharyngeal Phase Impairments: Multiple swallows (20-30 swallows)   Solid   GO    Solid: Not tested       Dorothy [name], Riley Nearing 09/26/2013,8:44 AM

## 2013-09-26 NOTE — Progress Notes (Signed)
Stroke Team Progress Note  HISTORY 79 y.o. female with a past medical history significant for atrial fibrillation, brought to [facility] ED by ambulance as a code stroke. She was last known well at 9 am today 09/24/2013, talking to her husband and suddenly became less responsive, fell to the floor, and was EMS arrived shew was not able to move the left side and had dysarthria. Never had similar symptoms before. Initial NIHSS 19.  CT brain revealed questionable ill defined area of hypodensity left MCA territory as well as question minimal hyperdensity of the right carotid terminus/M1 segment right middle cerebral artery raises the possibility of thrombus at this level. She was a candidate for IV TPA and it was administered. She is alert and awake, following commands, and started improving in then ED after receiving the IV tpa bolus.  Given hyperdense right MCA and NIH stroke scale of 19, concern for large vessel embolus. Patient was taken by interventional neuroradiology for a cerebral angiogram which showed occlusion of the right MCA proximally. She was administered intra-arterial TPA he 10.8 mg and 2 passes of the solitaire retrieval device for a TICI3 revascularization. She was intubated for airway protection during the procedure. She was admitted to the neuro ICU for further evaluation and treatment.  SUBJECTIVE No family  is at the bedside.    OBJECTIVE Most recent Vital Signs: Filed Vitals:   09/26/13 0500 09/26/13 0600 09/26/13 0700 09/26/13 0800  BP: 126/109 104/68 109/62 112/78  Pulse: 127 44 68 133  Temp: 97.9 F (36.6 C) 97.9 F (36.6 C) 98.1 F (36.7 C) 98.1 F (36.7 C)  TempSrc:      Resp: 20 18 19 19   Height:      Weight:      SpO2:    93%   CBG (last 3)   Recent Labs  09/25/13 2159 09/25/13 2309 09/26/13 0503  GLUCAP 95 81 106*    IV Fluid Intake:   . diltiazem (CARDIZEM) infusion 5 mg/hr (09/26/13 0800)    MEDICATIONS  . aspirin  325 mg Oral Daily  . chlorhexidine  15  mL Mouth/Throat BID  . diltiazem  5 mg Intravenous Once  . enoxaparin (LOVENOX) injection  30 mg Subcutaneous Q24H  . influenza vac split quadrivalent PF  0.5 mL Intramuscular Tomorrow-1000  . pantoprazole (PROTONIX) IV  40 mg Intravenous QHS  . pneumococcal 23 valent vaccine  0.5 mL Intramuscular Tomorrow-1000   PRN:  acetaminophen, acetaminophen, fentaNYL, labetalol, ondansetron (ZOFRAN) IV, senna-docusate  Diet:  NPO  Activity:  Bedrest, Up in chair DVT Prophylaxis:  Lovenox 30 mg sq daily, SCDs  CLINICALLY SIGNIFICANT STUDIES Basic Metabolic Panel:   Recent Labs Lab 09/25/13 0520 09/26/13 0530  NA 139 141  K 3.1* 3.6  CL 102 106  CO2 21 20  GLUCOSE 93 99  BUN 24* 20  CREATININE 1.03 0.90  CALCIUM 8.0* 8.7   Liver Function Tests:   Recent Labs Lab 09/24/13 1105  AST 89*  ALT 91*  ALKPHOS 115  BILITOT 0.6  PROT 6.4  ALBUMIN 2.8*   CBC:   Recent Labs Lab 09/24/13 1800 09/25/13 0520 09/26/13 0530  WBC 9.8 11.0* 11.2*  NEUTROABS 8.6* 9.3*  --   HGB 9.4* 9.0* 9.8*  HCT 27.9* 26.6* 30.1*  MCV 92.1 92.4 93.5  PLT 293 275 339   Coagulation:   Recent Labs Lab 09/24/13 1105  LABPROT 14.3  INR 1.13   Cardiac Enzymes:   Recent Labs Lab 09/24/13 1105  TROPONINI <  0.30   Urinalysis:   Recent Labs Lab 09/24/13 1143  COLORURINE YELLOW  LABSPEC 1.020  PHURINE 5.5  GLUCOSEU NEGATIVE  HGBUR NEGATIVE  BILIRUBINUR SMALL*  KETONESUR 15*  PROTEINUR NEGATIVE  UROBILINOGEN 1.0  NITRITE NEGATIVE  LEUKOCYTESUR NEGATIVE   Lipid Panel    Component Value Date/Time   CHOL 109 09/25/2013 0520   TRIG 78 09/25/2013 0520   HDL 26* 09/25/2013 0520   CHOLHDL 4.2 09/25/2013 0520   VLDL 16 09/25/2013 0520   LDLCALC 67 09/25/2013 0520   HgbA1C  Lab Results  Component Value Date   HGBA1C 6.1* 09/25/2013    Urine Drug Screen:     Component Value Date/Time   LABOPIA NONE DETECTED 09/24/2013 1143   COCAINSCRNUR NONE DETECTED 09/24/2013 1143   LABBENZ NONE  DETECTED 09/24/2013 1143   AMPHETMU NONE DETECTED 09/24/2013 1143   THCU NONE DETECTED 09/24/2013 1143   LABBARB NONE DETECTED 09/24/2013 1143    Alcohol Level:   Recent Labs Lab 09/24/13 1105  ETH <11     CT of the brain  09/25/2013    No intracranial hemorrhage.  Right middle cerebral artery distribution infarct is better visualize with indistinctness of the gray white junction of the right frontal operculum region and indistinctness of the right lenticular nucleus/right caudate head.    09/24/2013    Mild evolution of a right middle cerebral artery distribution acute infarct. No intracranial hemorrhage.   09/24/2013    Indistinct appearance of the right basal ganglia, right caudate, right subinsular region, right peri operculum region and portion of the right frontal lobe suggestive of a right middle cerebral artery acute infarct involving over 1/3 of the distribution of the right middle cerebral artery.  Question minimal hyperdensity of the right carotid terminus/M1 segment right middle cerebral artery raises the possibility of thrombus at this level.  No intracranial hemorrhage currently noted.    Cerebral Angio 09/24/2013 S/P rt common carotid arteriogram followed by TICI 3 Revascularization of occluded RT MCA prox using 10.8 mg of IA tpa and 2 passes with the 4 x 40 mm Solitaire FR retrieval device.   MRI of the brain    MRA of the brain    2D Echocardiogram    Carotid Doppler    CXR   09/26/2013    Persistent pulmonary edema and bibasilar effusions.  Increased atelectasis versus consolidation left lower lobe.  09/24/2013    1. Status post intubation.  Tip of tube is of above the carina. 2. Moderate pulmonary edema.   EKG  normal sinus rhythm, LBBB, PAC's noted.   Therapy Recommendations   Physical Exam   Mental Status:  Sedated on popofol  Cranial Nerves:  II: Discs flat bilaterally; left visual filed defect, pupils equal, round, reactive to light and accommodation  III,IV,  VI: ptosis not present, right gaze preference.  V,VII: smile asymmetric with left face weakness, facial light touch sensation normal bilaterally  VIII: hearing normal bilaterally  IX,X: gag reflex present  XI: bilateral shoulder shrug  XII: midline tongue extension without atrophy or fasciculations  Motor:  Left sided hemiplegia  Tone and bulk:normal tone throughout; no atrophy noted  Sensory: Pinprick and light touch intact throughout, bilaterally  Deep Tendon Reflexes:  Right: Upper Extremity Left: Upper extremity  biceps (C-5 to C-6) 2/4 biceps (C-5 to C-6) 2/4  tricep (C7) 2/4 triceps (C7) 2/4  Brachioradialis (C6) 2/4 Brachioradialis (C6) 2/4  Lower Extremity Lower Extremity  quadriceps (L-2 to L-4) 2/4 quadriceps (L-2 to  L-4) 2/4  Achilles (S1) 2/4 Achilles (S1) 2/4  Plantars:  Right: downgoing Left: upgoing  Cerebellar:  normal finger-to-nose, normal heel-to-shin test  Gait:  No tested.    ASSESSMENT Ms. Medora Roorda is a 79 y.o. female presenting with left hemiparesis and dysarthria. Status post IV t-PA 1147 at 09/24/2013 followed by revascularization of occluded proximal R  MCA prox using IA tpa and Solitaire retrieval device. Imaging confirms a right MCA infarct. Infarct felt to be  embolic secondary to atrial fibrillation.  On aspirin 81 mg orally every day prior to admission. Now on aspirin 325 mg orally every day for secondary stroke prevention. Patient with resultant left hemiparesis, dysphagia. Work up underway.  Intubation for neurointervention only atrial fibrillation, unknown if new or old, not on anticoagulation prior to admission, placed on IV diltiazem this am for rate control Hypertension, on diazide prior to admission Thyroid disease, on synthroid PTA Dehydration, Cr 1.03->0.87 Hgb 9.8  Hospital day # 2  TREATMENT/PLAN  Continue ICU level care given cardizem drip for afib  Continue aspirin 325 mg orally every day for secondary stroke prevention. Will  consider treatment with anticoagulation based on pt progression, stabilization.  NS at 45 cc/hr  OOB. Therapy evals  F/u MRI, MRA  Annie Main, MSN, RN, ANVP-BC, ANP-BC, GNP-BC [facility] Pager: [phone] 09/26/2013 8:49 AM  I have personally obtained a history, examined the patient, evaluated imaging results, and formulated the assessment and plan of care. I agree with the above. Delia Heady, MD

## 2013-09-26 NOTE — Progress Notes (Signed)
After starting amiodarone patient's rhythm on the monitor has changed. QRS is wide and appears that she may have a right bundle branch block. MD has been paged.

## 2013-09-26 NOTE — Progress Notes (Signed)
OT Cancellation Note  Patient Details Name: Dorothy [name] MRN: [id number] DOB: Mar 19, 1931   Cancelled Treatment:    Reason Eval/Treat Not Completed: Patient not medically ready (Will continue to follow.)  [name] 09/26/2013, 3:14 PM

## 2013-09-26 NOTE — Evaluation (Signed)
Speech Language Pathology Evaluation Patient Details Name: Dorothy [name] MRN: [id number] DOB: 1930-12-22 Today's Date: 09/26/2013 Time: 1478-2956 SLP Time Calculation (min): 23 min  Problem List:  Patient Active Problem List   Diagnosis Date Noted  . Acute respiratory failure with hypoxia 09/25/2013  . Stroke with cerebral ischemia 09/24/2013   Past Medical History:  Past Medical History  Diagnosis Date  . Stroke   . Atrial fibrillation    Past Surgical History: History reviewed. No pertinent past surgical history. HPI:  79 yo female with atrial fibrillation who presented with left hemiparesis and dysarthria, now status post cerebral angiogram with revascularization of occluded right MCA.   Assessment / Plan / Recommendation Clinical Impression  Pts cognitive function impaired today by lethargy. She is able to respond appropriately to simple questions with appropriate language and comprehension, but is unable to attend to more complex linguistic tasks due to lethargy. She requires multiple repetitions for short term memory, but shows good prognosis for improving memory as she was able to verbalize orientation at end of session and again with MD. She has a mild dysarthria and appears to have right attention preference though she does look midline and left with simple instructions. Pt will benefit from ongoing acute SLP therapy for basic cognition; she may be a good rehab candidate as well.     SLP Assessment  Patient needs continued Speech Lanaguage Pathology Services    Follow Up Recommendations  Inpatient Rehab    Frequency and Duration min 2x/week  2 weeks   Pertinent Vitals/Pain NA   SLP Goals  SLP Goals Potential to Achieve Goals: Good  SLP Evaluation Prior Functioning  Cognitive/Linguistic Baseline: Within functional limits Type of Home: House  Lives With: Spouse Available Help at Discharge:  (husband not present, pt states he is healthy)   Cognition  Overall  Cognitive Status: Impaired/Different from baseline Arousal/Alertness: Lethargic Orientation Level: Oriented to person;Disoriented to place;Disoriented to time;Disoriented to situation Attention: Focused;Sustained Focused Attention: Appears intact Sustained Attention: Impaired Sustained Attention Impairment: Verbal basic;Functional basic Memory: Impaired Memory Impairment: Storage deficit;Decreased recall of new information;Decreased short term memory Decreased Short Term Memory: Verbal basic Awareness: Impaired Awareness Impairment: Intellectual impairment;Emergent impairment Problem Solving: Impaired Problem Solving Impairment: Verbal basic;Functional basic Executive Function:  (NT due to lower level deficits. )    Comprehension  Auditory Comprehension Overall Auditory Comprehension: Impaired Yes/No Questions: Within Functional Limits Commands: Impaired One Step Basic Commands: 75-100% accurate Two Step Basic Commands: 75-100% accurate Multistep Basic Commands: 0-24% accurate Conversation: Simple Interfering Components: Attention;Working Theatre manager: Not tested Reading Comprehension Reading Status: Within funtional limits    Expression Verbal Expression Overall Verbal Expression: Appears within functional limits for tasks assessed   Oral / Motor Oral Motor/Sensory Function Overall Oral Motor/Sensory Function: Impaired Labial ROM: Reduced left Labial Symmetry: Abnormal symmetry left Labial Strength: Reduced Labial Sensation: Reduced Lingual ROM: Within Functional Limits Lingual Symmetry: Within Functional Limits Lingual Strength: Reduced Facial ROM: Within Functional Limits Facial Symmetry: Within Functional Limits Facial Strength: Within Functional Limits Facial Sensation: Within Functional Limits Velum: Within Functional Limits Mandible: Within Functional Limits Motor Speech Overall Motor Speech: Impaired Respiration:  Within functional limits Phonation: Normal Resonance: Within functional limits Articulation: Impaired Level of Impairment: Word Intelligibility: Intelligibility reduced Word: 50-74% accurate Phrase: 50-74% accurate Sentence: 50-74% accurate Conversation: 50-74% accurate   GO    Harlon Ditty, MA CCC-SLP [phone]  Claudine Mouton 09/26/2013, 8:56 AM

## 2013-09-26 NOTE — Progress Notes (Signed)
PT Cancellation Note  Patient Details Name: Dorothy [name] MRN: [id number] DOB: [birth year]   Cancelled Treatment:    Reason Eval/Treat Not Completed: Medical issues which prohibited therapy;Patient not medically ready (Pt started on amiodarone and RN asked to hold at this time.) PT to return as able.   Marcene Brawn 09/26/2013, 2:40 PM

## 2013-09-26 NOTE — Progress Notes (Signed)
PULMONARY  / CRITICAL CARE MEDICINE CONSULTATION  Name: Dorothy [name] MRN: [id number] DOB: [birth year]    ADMISSION DATE:  09/24/2013  CHIEF COMPLAINT:  Stroke   BRIEF PATIENT DESCRIPTION: 79 yo female with atrial fibrillation who presented with left hemiparesis and dysarthria, now status post cerebral angiogram with revascularization of occluded right MCA.  SIGNIFICANT EVENTS / STUDIES:  1. CT Head 09/24/2013 11:34 - suggestive of R MCA acute infarct, no intracranial hemorrhage 2. Alteplase 09/24/2013 11:45 3. IR R common carotid arteriogram, revascularization of occluded R MCA with tPA and 2 passes with retrieval device 09/24/2013 4. CT Head 09/24/2013 17:34 - mild evolution R MCA acute infarct, no intracranial hemorrhage 5. Intubation for procedure 09/24/2013, no extubated sine 12/07  LINES / TUBES: 1. ETT 09/24/2013 >>> 12/07  2. L radial art line 09/24/2013 >>> 3. R femoral art line 09/24/2013 >>> 12/07  CULTURES: None  ANTIBIOTICS: 1. Cefazolin 2 g IV x 1 12/6/201  SUBJECTIVE:  Fib rvr  VITAL SIGNS: Temp:  [91.9 F (33.3 C)-98.1 F (36.7 C)] 97.9 F (36.6 C) (12/08 0900) Pulse Rate:  [44-133] 96 (12/08 0900) Resp:  [13-25] 25 (12/08 0900) BP: (102-126)/(48-109) 118/60 mmHg (12/08 0900) SpO2:  [80 %-100 %] 95 % (12/08 0900) Arterial Line BP: (118-147)/(45-63) 131/49 mmHg (12/08 0900) FiO2 (%):  [30 %] 30 % (12/07 1642)   VENTILATOR SETTINGS: Vent Mode:  [-] PSV;CPAP FiO2 (%):  [30 %] 30 % PEEP:  [5 cmH20] 5 cmH20 Pressure Support:  [8 cmH20] 8 cmH20  INTAKE / OUTPUT: Intake/Output     12/07 0701 - 12/08 0700 12/08 0701 - 12/09 0700   I.V. (mL/kg) 479.4 (9.8) 8.3 (0.2)   IV Piggyback 300    Total Intake(mL/kg) 779.4 (15.9) 8.3 (0.2)   Urine (mL/kg/hr) 580 (0.5)    Total Output 580     Net +199.4 +8.3          PHYSICAL EXAMINATION: General:  Elderly and tire appearing female lying in bed with husband at bedside HEENT: NCAT, EOMI, no eye crust/discharge Neck:   No JVD noted  Cardiovascular:  Irregularly irregular, no murmur/rub/gallop Lungs:  bilateral rhonchi anteriorly Abdomen:  +BS, soft, nontender, nondistended Musculoskeletal:  No clubbing or cyanosis, not following command with left arm and hand Skin:  No rash noted  LABS:  CBC Recent Labs     09/24/13  1800  09/25/13  0520  09/26/13  0530  WBC  9.8  11.0*  11.2*  HGB  9.4*  9.0*  9.8*  HCT  27.9*  26.6*  30.1*  PLT  293  275  339    Coag's Recent Labs     09/24/13  1105  APTT  28  INR  1.13    BMET Recent Labs     09/24/13  1105  09/24/13  1120  09/24/13  1453  09/25/13  0520  09/26/13  0530  NA  131*  132*  134*  139  141  K  3.2*  3.3*  2.6*  3.1*  3.6  CL  91*  97   --   102  106  CO2  21   --    --   21  20  BUN  37*  34*   --   24*  20  CREATININE  1.42*  1.60*   --   1.03  0.90  GLUCOSE  99  100*   --   93  99    Electrolytes Recent Labs  09/24/13  1105  09/25/13  0520  09/26/13  0530  CALCIUM  9.1  8.0*  8.7    Sepsis Markers No results found for this basename: LACTICACIDVEN, PROCALCITON, O2SATVEN,  in the last 72 hours  ABG Recent Labs     09/24/13  1453  09/24/13  2125  09/25/13  0430  PHART  7.258*  7.506*  7.474*  PCO2ART  39.9  28.2*  30.5*  PO2ART  151.0*  256.0*  109.0*    Liver Enzymes Recent Labs     09/24/13  1105  AST  89*  ALT  91*  ALKPHOS  115  BILITOT  0.6  ALBUMIN  2.8*    Cardiac Enzymes Recent Labs     09/24/13  1105  TROPONINI  <0.30    Glucose Recent Labs     09/25/13  0231  09/25/13  0432  09/25/13  0750  09/25/13  2159  09/25/13  2309  09/26/13  0503  GLUCAP  87  83  84  95  81  106*    Imaging Ct Head Wo Contrast  09/25/2013   CLINICAL DATA:  Post tPA for left-sided weakness.  EXAM: CT HEAD WITHOUT CONTRAST  TECHNIQUE: Contiguous axial images were obtained from the base of the skull through the vertex without intravenous contrast.  COMPARISON:  09/24/2013 CT and catheter angiogram.   04/08/2011 CT  FINDINGS: No intracranial hemorrhage.  Right middle cerebral artery distribution infarct is better visualize with indistinctness of the gray white junction of the right frontal operculum region and indistinctness of the right lenticular nucleus/right caudate head.  No intracranial mass lesion noted on this unenhanced exam.  IMPRESSION: No intracranial hemorrhage.  Right middle cerebral artery distribution infarct is better visualize with indistinctness of the gray white junction of the right frontal operculum region and indistinctness of the right lenticular nucleus/right caudate head.   Electronically Signed   By: Bridgett Larsson M.D.   On: 09/25/2013 12:38   Ct Head Wo Contrast  09/24/2013   CLINICAL DATA:  Status post interventional procedure.  EXAM: CT HEAD WITHOUT CONTRAST  TECHNIQUE: Contiguous axial images were obtained from the base of the skull through the vertex without intravenous contrast.  COMPARISON:  Earlier today.  FINDINGS: Mildly progressive ill-defined low density in a right middle cerebral artery distribution. No intracranial hemorrhage or mass-effect. Stable mildly enlarged ventricles and subarachnoid spaces. Unremarkable bones and included paranasal sinuses.  IMPRESSION: Mild evolution of a right middle cerebral artery distribution acute infarct. No intracranial hemorrhage.   Electronically Signed   By: Gordan Payment M.D.   On: 09/24/2013 17:31   Ct Head Wo Contrast  09/24/2013   CLINICAL DATA:  Left-sided paralysis.  Difficulty speaking.  EXAM: CT HEAD WITHOUT CONTRAST  TECHNIQUE: Contiguous axial images were obtained from the base of the skull through the vertex without intravenous contrast.  COMPARISON:  04/08/2011.  FINDINGS: Indistinct appearance of the right basal ganglia, right caudate, right subinsular region, right peri operculum region and portion of the right frontal lobe suggestive of a right middle cerebral artery acute infarct involving over 1/3 of the distribution  of the right middle cerebral artery.  Question minimal hyperdensity of the right carotid terminus/M1 segment right middle cerebral artery raises the possibility of thrombus at this level.  No intracranial hemorrhage currently noted.  No intracranial mass lesion noted on this unenhanced exam.  Vascular calcifications.  IMPRESSION: Indistinct appearance of the right basal ganglia, right caudate, right subinsular region, right peri  operculum region and portion of the right frontal lobe suggestive of a right middle cerebral artery acute infarct involving over 1/3 of the distribution of the right middle cerebral artery.  Question minimal hyperdensity of the right carotid terminus/M1 segment right middle cerebral artery raises the possibility of thrombus at this level.  No intracranial hemorrhage currently noted.  These results were called by telephone at the time of interpretation on 09/24/2013 at 11:24 AM to Dr. Cyril Mourning, who verbally acknowledged these results.   Electronically Signed   By: Bridgett Larsson M.D.   On: 09/24/2013 11:31   Dg Chest Port 1 View  09/26/2013   CLINICAL DATA:  Respiratory failure, followup  EXAM: PORTABLE CHEST - 1 VIEW  COMPARISON:  Portable exam 0608 hr compared to 09/24/2013  FINDINGS: Endotracheal and nasogastric tubes no longer identified.  Upper normal heart size.  Pulmonary vascular congestion.  Diffuse interstitial infiltrates favor pulmonary edema.  Underlying COPD with small bibasilar pleural effusions.  Atelectasis versus consolidation in left lower lobe increased since previous exam.  No pneumothorax.  Bones demineralized with chronic tear right rotator cuff.  IMPRESSION: Persistent pulmonary edema and bibasilar effusions.  Increased atelectasis versus consolidation left lower lobe.   Electronically Signed   By: Ulyses Southward M.D.   On: 09/26/2013 07:22   Dg Chest Port 1 View  09/24/2013   CLINICAL DATA:  Status post stroke  EXAM: PORTABLE CHEST - 1 VIEW  COMPARISON:  09/01/2013   FINDINGS: Endotracheal tube tip is in satisfactory position above the carina. The nasogastric tube tip is in the stomach with side port below GE junction. Normal heart size. There is moderate diffuse interstitial edema which is new from the previous examination.  IMPRESSION: 1. Status post intubation.  Tip of tube is of above the carina. 2. Moderate pulmonary edema.   Electronically Signed   By: Signa Kell M.D.   On: 09/24/2013 18:36     CXR: int edema  ASSESSMENT / PLAN:  79 yo female with atrial fibrillation, presenting with R MCA stroke s/p tPA and R common carotid arteriogram with revascularization of prox R MCA, now on mechanical ventilation post-procedure. Extubate 12/07.  #. Right MCA Stroke  --> s/p tPA, anteriogram with revascularization  --> Continue to have left UE weakness - Lipid panel checked with low HDL - ECHO completed 12/08, pending result - ASA - Redo swallow evaluation today - Start PO diet and meds once pass swallow evaluation - Consider early PT/OT/ST  #. New Onset Atrial Fibrillation - Cardizem drip and PRN Labetalol, continue to titrate since heart rate is borderline control -with a maximized cadizem and borderline BP, will amio load now  - Monitor blood pressure - s/p tPA and currently on DVT prophylaxis dose of Lovenox - Currently on ASA - CHA2DS2 VASc score 6, will need long term anticoagulation, neuro to eval CT then decide (and cbc to follow) -may need cvp -control rate and edema should improve  #. Acute Anemia, dilution likely  --> Likely due to blood loss and dilution  --> Admission hgb 12.2 now at 9.8 - Daily CBC - Transfuse if level drop below 7gm/dL -consider lasix when rate controlled  #INterstial edema Secondary to rvr likely, acid pos balance Low threshold when able for lasix  #. Leukocytosis  --> Current level at 11.2  --> Likely reactive to procedure  --> No sign of overt infectious process - Monitor - Daily CBC  Ccm time 30  min   Mcarthur Rossetti. Tyson Alias, MD,  FACP Pgr: [id number]  Pulmonary & Critical Care

## 2013-09-26 NOTE — Progress Notes (Signed)
Echocardiogram 2D Echocardiogram has been performed.  Kinsley Holderman 09/26/2013, 10:06 AM

## 2013-09-26 NOTE — Progress Notes (Signed)
INITIAL NUTRITION ASSESSMENT  DOCUMENTATION CODES Per approved criteria  -Severe malnutrition in the context of chronic illness -Underweight   INTERVENTION:  1. Supplement diet as appropriate.   NUTRITION DIAGNOSIS: Malnutrition related to chronic illness as evidenced by severe fat and muscle wasting.   Goal: Pt to meet >/= 90% of their estimated nutrition needs   Monitor:  Diet advancement, PO intake, weight trend, labs  Reason for Assessment: Pt identified as at nutrition risk on the Malnutrition Screen Tool  79 y.o. female  Admitting Dx: <principal problem not specified>  ASSESSMENT: Pt with R MCA stroke s/p tPA and R common carotid arteriogram with revascularization of prox R MCA. Pt extubated 12/7. Per SLP pt remains too lethargic for MBS; planned follow-up in 1-2 days.  Spoke with pt and her ? Husband. Per hx pt has not been eating great and losing weight. Per husband pt has lost 16 lb (12% of her body weight in the last year). Per recall pt is eating about 1/2 of her meals. She had recently started to drink ensure.   Nutrition Focused Physical Exam:  Subcutaneous Fat:  Orbital Region: severe wasting Upper Arm Region: severe wasting Thoracic and Lumbar Region: Severe wasting  Muscle:  Temple Region: Severe wasting Clavicle Bone Region: Severe wasting Clavicle and Acromion Bone Region: severe wasting Scapular Bone Region: severe wasting Dorsal Hand: severe wasting Patellar Region: severe wasting Anterior Thigh Region: severe wasting Posterior Calf Region: severe wasting  Edema: not present   Height: Ht Readings from Last 1 Encounters:  09/24/13 5\' 5"  (1.651 m)    Weight: Wt Readings from Last 1 Encounters:  09/25/13 108 lb 0.4 oz (49 kg)    Ideal Body Weight: 56.8 kg   % Ideal Body Weight: 86%  Wt Readings from Last 10 Encounters:  09/25/13 108 lb 0.4 oz (49 kg)  09/25/13 108 lb 0.4 oz (49 kg)    Usual Body Weight: 124 lb   % Usual Body  Weight: 88%  BMI:  Body mass index is 17.98 kg/(m^2).  Estimated Nutritional Needs: Kcal: 1450-1600 Protein: 70-80 grams Fluid: > 1.5 L/day  Skin: no issues noted  Diet Order: NPO  EDUCATION NEEDS: -No education needs identified at this time   Intake/Output Summary (Last 24 hours) at 09/26/13 0938 Last data filed at 09/26/13 0800  Gross per 24 hour  Intake 705.87 ml  Output    490 ml  Net 215.87 ml    Last BM: PTA   Labs:   Recent Labs Lab 09/24/13 1105 09/24/13 1120 09/24/13 1453 09/25/13 0520 09/26/13 0530  NA 131* 132* 134* 139 141  K 3.2* 3.3* 2.6* 3.1* 3.6  CL 91* 97  --  102 106  CO2 21  --   --  21 20  BUN 37* 34*  --  24* 20  CREATININE 1.42* 1.60*  --  1.03 0.90  CALCIUM 9.1  --   --  8.0* 8.7  GLUCOSE 99 100*  --  93 99    CBG (last 3)   Recent Labs  09/25/13 2159 09/25/13 2309 09/26/13 0503  GLUCAP 95 81 106*    Scheduled Meds: . aspirin  325 mg Oral Daily  . chlorhexidine  15 mL Mouth/Throat BID  . diltiazem  5 mg Intravenous Once  . enoxaparin (LOVENOX) injection  30 mg Subcutaneous Q24H  . influenza vac split quadrivalent PF  0.5 mL Intramuscular Tomorrow-1000  . pantoprazole (PROTONIX) IV  40 mg Intravenous QHS  . pneumococcal 23  valent vaccine  0.5 mL Intramuscular Tomorrow-1000    Continuous Infusions: . sodium chloride    . diltiazem (CARDIZEM) infusion 5 mg/hr (09/26/13 0800)    Past Medical History  Diagnosis Date  . Stroke   . Atrial fibrillation     History reviewed. No pertinent past surgical history.  Kendell Bane RD, LDN, CNSC [phone] Pager [phone] After Hours Pager

## 2013-09-26 NOTE — Progress Notes (Signed)
VASCULAR LAB PRELIMINARY  PRELIMINARY  PRELIMINARY  PRELIMINARY  Carotid duplex  completed.    Preliminary report:  Bilateral:  1-39% ICA stenosis.  Vertebral artery flow is antegrade.      Benedict Kue, RVT 09/26/2013, 12:33 PM

## 2013-09-27 ENCOUNTER — Inpatient Hospital Stay (HOSPITAL_COMMUNITY): Payer: Medicare Other

## 2013-09-27 ENCOUNTER — Encounter (HOSPITAL_COMMUNITY): Payer: Self-pay | Admitting: Interventional Radiology

## 2013-09-27 LAB — COMPREHENSIVE METABOLIC PANEL
ALT: 78 U/L — ABNORMAL HIGH (ref 0–35)
AST: 72 U/L — ABNORMAL HIGH (ref 0–37)
Albumin: 2.6 g/dL — ABNORMAL LOW (ref 3.5–5.2)
BUN: 23 mg/dL (ref 6–23)
CO2: 17 mEq/L — ABNORMAL LOW (ref 19–32)
Calcium: 8.9 mg/dL (ref 8.4–10.5)
Chloride: 106 mEq/L (ref 96–112)
Sodium: 144 mEq/L (ref 135–145)
Total Bilirubin: 0.4 mg/dL (ref 0.3–1.2)
Total Protein: 6.2 g/dL (ref 6.0–8.3)

## 2013-09-27 LAB — GLUCOSE, CAPILLARY
Glucose-Capillary: 191 mg/dL — ABNORMAL HIGH (ref 70–99)
Glucose-Capillary: 177 mg/dL — ABNORMAL HIGH (ref 70–99)

## 2013-09-27 MED ORDER — FUROSEMIDE 10 MG/ML IJ SOLN
20.0000 mg | Freq: Two times a day (BID) | INTRAMUSCULAR | Status: DC
  Administered 2013-09-27 (×2): 20 mg via INTRAVENOUS
  Filled 2013-09-27 (×2): qty 2

## 2013-09-27 MED ORDER — FUROSEMIDE 10 MG/ML IJ SOLN
INTRAMUSCULAR | Status: AC
  Filled 2013-09-27: qty 4

## 2013-09-27 MED ORDER — RESOURCE THICKENUP CLEAR PO POWD
ORAL | Status: DC | PRN
  Filled 2013-09-27 (×2): qty 125

## 2013-09-27 NOTE — Procedures (Signed)
Objective Swallowing Evaluation: Modified Barium Swallowing Study  Patient Details  Name: Dorothy [name] MRN: [id number] Date of Birth: 12-12-1930  Today's Date: 09/27/2013 Time: 1300-1330 SLP Time Calculation (min): 30 min  Past Medical History:  Past Medical History  Diagnosis Date  . Stroke   . Atrial fibrillation    Past Surgical History: History reviewed. No pertinent past surgical history. HPI:  79 yo female with atrial fibrillation who presented with left hemiparesis and dysarthria, now status post cerebral angiogram with revascularization of occluded right MCA.     Assessment / Plan / Recommendation Clinical Impression  Dysphagia Diagnosis: Moderate oral phase dysphagia;Severe pharyngeal phase dysphagia Clinical impression: Pt presents with moderate to severe dysphagia with left oral and lingual weakness leading to left residuals and decreased bolus propulsion. Pharyngeal phase characterized by weakness particularly of base of tongue and hyolaryngeal elevation/excursion with limited opening of UES. Suspect baseline UES hypertrophy. Pt with decreased closure of airway during the swallow with penetration of liquid textures with no sensation. There are moderate to severe resdiuals remaining post swallow. Postures not effective in decreasing residuals.  Recommend pt initiate a puree/pudding thick diet with the hopes that use of the swallow mechanism will improve function. Pt may need alternate method of nutrition if she does not sustain arousal for meals and does not have appropriate intake. SLP hopes to progress to liquid textures in a few days.     Treatment Recommendation  Therapy as outlined in treatment plan below    Diet Recommendation Dysphagia 1 (Puree);Pudding-thick liquid   Liquid Administration via: Spoon Medication Administration: Crushed with puree Supervision: Staff to assist with self feeding;Full supervision/cueing for compensatory strategies Compensations: Slow  rate;Small sips/bites;Multiple dry swallows after each bite/sip Postural Changes and/or Swallow Maneuvers: Seated upright 90 degrees    Other  Recommendations Oral Care Recommendations: Oral care BID Other Recommendations: Order thickener from pharmacy   Follow Up Recommendations  Inpatient Rehab    Frequency and Duration min 2x/week  2 weeks   Pertinent Vitals/Pain NA    SLP Swallow Goals     General HPI: 79 yo female with atrial fibrillation who presented with left hemiparesis and dysarthria, now status post cerebral angiogram with revascularization of occluded right MCA. Type of Study: Modified Barium Swallowing Study Reason for Referral: Objectively evaluate swallowing function Diet Prior to this Study: NPO Temperature Spikes Noted: No Respiratory Status: Nasal cannula History of Recent Intubation: Yes Length of Intubations (days): 2 days Date extubated: 09/25/13 Behavior/Cognition: Alert;Cooperative Oral Cavity - Dentition: Edentulous Oral Motor / Sensory Function: Impaired - see Bedside swallow eval Self-Feeding Abilities: Total assist Patient Positioning: Upright in chair Baseline Vocal Quality: Clear Volitional Cough: Weak Volitional Swallow: Able to elicit Anatomy: Within functional limits Pharyngeal Secretions: Not observed secondary MBS    Reason for Referral Objectively evaluate swallowing function   Oral Phase Oral Preparation/Oral Phase Oral Phase: Impaired Oral - Honey Oral - Honey Teaspoon: Left anterior bolus loss;Weak lingual manipulation;Left pocketing in lateral sulci;Lingual/palatal residue;Delayed oral transit;Reduced posterior propulsion;Incomplete tongue to palate contact Oral - Nectar Oral - Nectar Teaspoon: Left anterior bolus loss;Weak lingual manipulation;Left pocketing in lateral sulci;Lingual/palatal residue;Delayed oral transit;Reduced posterior propulsion;Incomplete tongue to palate contact Oral - Solids Oral - Puree: Left anterior bolus  loss;Weak lingual manipulation;Left pocketing in lateral sulci;Lingual/palatal residue;Delayed oral transit;Reduced posterior propulsion;Incomplete tongue to palate contact   Pharyngeal Phase Pharyngeal Phase Pharyngeal Phase: Impaired Pharyngeal - Honey Pharyngeal - Honey Teaspoon: Delayed swallow initiation;Reduced pharyngeal peristalsis;Reduced epiglottic inversion;Reduced anterior laryngeal mobility;Reduced laryngeal elevation;Reduced airway/laryngeal  closure;Reduced tongue base retraction;Pharyngeal residue - valleculae;Pharyngeal residue - pyriform sinuses;Penetration/Aspiration during swallow;Compensatory strategies attempted (Comment) (chin tuck, not effective) Penetration/Aspiration details (honey teaspoon): Material enters airway, CONTACTS cords and not ejected out;Material does not enter airway Pharyngeal - Nectar Pharyngeal - Nectar Teaspoon: Delayed swallow initiation;Reduced pharyngeal peristalsis;Reduced epiglottic inversion;Reduced anterior laryngeal mobility;Reduced laryngeal elevation;Reduced airway/laryngeal closure;Reduced tongue base retraction;Pharyngeal residue - valleculae;Pharyngeal residue - pyriform sinuses;Penetration/Aspiration during swallow Penetration/Aspiration details (nectar teaspoon): Material enters airway, passes BELOW cords without attempt by patient to eject out (silent aspiration) Pharyngeal - Solids Pharyngeal - Puree: Delayed swallow initiation;Reduced pharyngeal peristalsis;Reduced epiglottic inversion;Reduced anterior laryngeal mobility;Reduced laryngeal elevation;Reduced airway/laryngeal closure;Reduced tongue base retraction;Pharyngeal residue - valleculae;Pharyngeal residue - pyriform sinuses;Compensatory strategies attempted (Comment) (chin tuck not effective. ) Penetration/Aspiration details (puree): Material does not enter airway  Cervical Esophageal Phase    GO    Cervical Esophageal Phase Cervical Esophageal Phase: Impaired        Harlon Ditty, MA CCC-SLP [phone]  Claudine Mouton 09/27/2013, 2:03 PM

## 2013-09-27 NOTE — Consult Note (Signed)
Physical Medicine and Rehabilitation Consult  Reason for Consult: Left hemiparesis, left inattention, left visual field deficits, dysarthria, dysphagia.  Referring Physician:  Dr. Pearlean Brownie.    HPI: Dorothy [name] is a 79 y.o. RH-female with history of HTN, hypothyroid, who was known well at 9 am on 09/24/2013. She was talking to her significant other when she suddenly became less responsive, fell to the floor, and was not able to move the left side and had dysarthria. CT brain revealed questionable ill defined area of hypodensity left MCA territory as well as question minimal hyperdensity of the right carotid terminus/M1 segment right middle cerebral artery raises the possibility of thrombus at this level. She was a candidate for IV TPA and it was administered. She is alert and awake, following commands, and started improving in then ED after receiving the IV tpa bolus. Given hyperdense right MCA and NIH stroke scale of 19, concern for large vessel embolus. Patient was taken by interventional neuroradiology for a cerebral angiogram which showed occlusion of the right MCA proximally. She was administered intra-arterial TPA he 10.8 mg and 2 passes of the solitaire retrieval device for a TICI3 revascularization. She was intubated for airway protection during the procedure and extubated on 09/25/13.   Carotid dopplers without ICA stenosis. 2D echo with EF moderately to severely reduced to 30-35%, mild-moderate MV calcification, elevated pulmonary pressures. MRI brain with Large acute/subacute right middle cerebral artery distribution nonhemorrhagic infarct with mild local mass effect. A fib with RVR treated with amiodarone. Fluid overload treated with diuretics. MBS done revealing moderate to severe dysphagia with penetration and residuals--D1, pudding liquids recommended. Neurology recommends increasing ASA to 325 mg for embolic stroke due to A fib. Unknown if A fib is old or new.  Patient with resultant left  hemiparesis, left visual field deficit with left inattention and visiospatial deficits, dysphagia and dysarthria. MD, OT, ST recommending CIR.    Review of Systems  Unable to perform ROS: mental acuity  Eyes: Negative for blurred vision and double vision.  Respiratory: Negative for shortness of breath.   Cardiovascular: Negative for chest pain and palpitations.  Gastrointestinal: Negative for abdominal pain.  Musculoskeletal: Positive for back pain and myalgias.  Neurological: Positive for speech change and focal weakness. Negative for headaches.    Past Medical History  Diagnosis Date  . Stroke   . Atrial fibrillation    History reviewed. No pertinent past surgical history.  History reviewed. No pertinent family history.  Social History: Lives with significant other (for years and years)-- she has no children. Primary md-Dr. 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 She does not use tobacco, alcohol, or illicit drug.  Allergies: No Known Allergies  Medications Prior to Admission  Medication Sig Dispense Refill  . aspirin EC 81 MG tablet Take 81 mg by mouth daily.      . Biotin 1000 MCG tablet Take 1,000 mcg by mouth daily.      . Calcium-Vitamin D (CALTRATE 600 PLUS-VIT D PO) Take 1 tablet by mouth daily.      . cholecalciferol (VITAMIN D) 1000 UNITS tablet Take 1,000 Units by mouth daily.      . digoxin (LANOXIN) 0.125 MG tablet Take by mouth daily.      Marland Kitchen levothyroxine (SYNTHROID, LEVOTHROID) 75 MCG tablet Take 75 mcg by mouth daily before breakfast.      . Misc Natural Products (OSTEO BI-FLEX TRIPLE STRENGTH PO) Take 1 tablet by mouth daily.      . Multiple Vitamins-Minerals (CENTRUM  SILVER ULTRA WOMENS) TABS Take 1 tablet by mouth daily.      . pentoxifylline (TRENTAL) 400 MG CR tablet Take 400 mg by mouth 3 (three) times daily with meals.      . Probiotic Product (PHILLIPS COLON HEALTH) CAPS Take 1 capsule by mouth daily.      . traMADol (ULTRAM) 50 MG tablet Take 50 mg by mouth  3 (three) times daily as needed for moderate pain.      Marland Kitchen triamterene-hydrochlorothiazide (DYAZIDE) 37.5-25 MG per capsule Take 1 capsule by mouth daily.        Home: Home Living Family/patient expects to be discharged to:: Private residence Living Arrangements: Spouse/significant other Available Help at Discharge: Family;Available 24 hours/day (questionable if sig other can care for pt) Type of Home: House Home Access: Stairs to enter;Ramped entrance Home Layout: One level Home Equipment: None  Lives With: Spouse  Functional History: Prior Function Comments: cared for other people Functional Status:  Mobility: Bed Mobility Bed Mobility: Supine to Sit Supine to Sit: HOB elevated;1: +2 Total assist Supine to Sit: Patient Percentage: 20% Transfers Transfers: Sit to Stand;Stand to Sit;Stand Pivot Transfers Sit to Stand: 1: +1 Total assist;With upper extremity assist;From bed (2nd person for line management) Stand to Sit: 1: +1 Total assist;With upper extremity assist;To chair/3-in-1 Stand Pivot Transfers: 1: +1 Total assist Ambulation/Gait Ambulation/Gait Assistance: Not tested (comment)    ADL: ADL Eating/Feeding: Other (comment);NPO (to be assessed by speech) Grooming: Moderate assistance Where Assessed - Grooming: Supported sitting Upper Body Bathing: Maximal assistance Where Assessed - Upper Body Bathing: Supported sitting Lower Body Bathing: Maximal assistance Where Assessed - Lower Body Bathing: Supported sit to stand Upper Body Dressing: +1 Total assistance Where Assessed - Upper Body Dressing: Supported sitting Lower Body Dressing: +1 Total assistance Where Assessed - Lower Body Dressing: Supported sit to Art therapist Transfer: +2 Total assistance;Simulated (L bias. Difficulty with upright posture) Equipment Used: Gait belt Transfers/Ambulation Related to ADLs: +2 total A at this time stad pivot only ADL Comments: significant decline fin  funciton  Cognition: Cognition Overall Cognitive Status: Impaired/Different from baseline Arousal/Alertness: Lethargic Orientation Level: Oriented to person;Oriented to place;Oriented to time;Disoriented to situation Attention: Focused;Sustained Focused Attention: Appears intact Sustained Attention: Impaired Sustained Attention Impairment: Verbal basic;Functional basic Memory: Impaired Memory Impairment: Storage deficit;Decreased recall of new information;Decreased short term memory Decreased Short Term Memory: Verbal basic Awareness: Impaired Awareness Impairment: Intellectual impairment;Emergent impairment Problem Solving: Impaired Problem Solving Impairment: Verbal basic;Functional basic Executive Function:  (NT due to lower level deficits. ) Cognition Arousal/Alertness: Lethargic Behavior During Therapy: Flat affect Overall Cognitive Status: Impaired/Different from baseline Area of Impairment: Problem solving;Orientation;Safety/judgement;Awareness Orientation Level: Time (pt thinks she's at [facility]) Safety/Judgement: Decreased awareness of safety;Decreased awareness of deficits Awareness: Intellectual Problem Solving: Slow processing;Requires verbal cues;Requires tactile cues General Comments: Cognitive deficits partly due to lethargy?  Blood pressure 127/73, pulse 84, temperature 97.9 F (36.6 C), temperature source Core (Comment), resp. rate 29, height 5\' 5"  (1.651 m), weight 49 kg (108 lb 0.4 oz), SpO2 95.00%. Physical Exam  Nursing note and vitals reviewed. Constitutional: She appears cachectic. She has a sickly appearance. Nasal cannula in place.  HENT:  Head: Normocephalic and atraumatic.  Edentulous. Tongue with white coating.   Eyes: Conjunctivae are normal. Pupils are equal, round, and reactive to light.  Cardiovascular: An irregularly irregular rhythm present. Tachycardia present.   Respiratory: No accessory muscle usage. No respiratory distress. She has  decreased breath sounds. She has no wheezes.  GI: Soft. Bowel  sounds are normal. She exhibits no distension. There is no tenderness.  Musculoskeletal: She exhibits edema (LUE--forearm to hand).  Neurological: She is alert.  Oriented to self and situation--Place-"ED". Able to follow simple one step commands. Moderate dysarthria with soft voice. Right gaze preference but does turn head to left field. Left hemiparesis is dense. No movement in LUE has tr to 1 movement in left HF and KE. Sensed pain in leg minimally in arm. Severe left facial droop. Was able to manage spoonfuls of thickened soda. Follows simple commands with delay  Skin: Skin is warm and dry.  Psychiatric:  Flat, delayed    Results for orders placed during the hospital encounter of 09/24/13 (from the past 24 hour(s))  COMPREHENSIVE METABOLIC PANEL     Status: Abnormal   Collection Time    09/27/13 10:45 AM      Result Value Range   Sodium 144  135 - 145 mEq/L   Potassium 4.1  3.5 - 5.1 mEq/L   Chloride 106  96 - 112 mEq/L   CO2 17 (*) 19 - 32 mEq/L   Glucose, Bld 155 (*) 70 - 99 mg/dL   BUN 23  6 - 23 mg/dL   Creatinine, Ser 1.61  0.50 - 1.10 mg/dL   Calcium 8.9  8.4 - 09.6 mg/dL   Total Protein 6.2  6.0 - 8.3 g/dL   Albumin 2.6 (*) 3.5 - 5.2 g/dL   AST 72 (*) 0 - 37 U/L   ALT 78 (*) 0 - 35 U/L   Alkaline Phosphatase 89  39 - 117 U/L   Total Bilirubin 0.4  0.3 - 1.2 mg/dL   GFR calc non Af Amer 67 (*) >90 mL/min   GFR calc Af Amer 77 (*) >90 mL/min   Mr Brain Wo Contrast  09/27/2013   CLINICAL DATA:  Left hemiparesis post tPA and clot retrieval. Atrial fibrillation.  EXAM: MRI HEAD WITHOUT CONTRAST  TECHNIQUE: Multiplanar, multiecho pulse sequences of the brain and surrounding structures were obtained without intravenous contrast.  COMPARISON:  11/26/2012 CT.  No comparison MR.  FINDINGS: Large acute/ subacute right middle cerebral artery distribution infarct involving right frontal lobe, right anterior temporal lobe,  right subinsular region, right peri operculum region, right parietal lobe, right lenticular nucleus and right caudate head.  No intracranial hemorrhage. There is mild local mass effect upon the right lateral ventricle without midline shift.  Atrophy without hydrocephalus.  No intracranial mass lesion noted on this unenhanced exam.  Transverse ligament hypertrophy. Cervical spondylotic changes without spinal stenosis. Cervical medullary junction, pituitary region, pineal region and orbital structures unremarkable.  Major intracranial vascular structures appear patent.  IMPRESSION: Large acute/subacute right middle cerebral artery distribution nonhemorrhagic infarct with mild local mass effect without midline shift as detailed above.  These results will be called to the ordering clinician or representative by the Radiologist Assistant, and communication documented in the PACS Dashboard.   Electronically Signed   By: Bridgett Larsson M.D.   On: 09/27/2013 15:00    Assessment/Plan: Diagnosis: large right MCA infarct 1. Does the need for close, 24 hr/day medical supervision in concert with the patient's rehab needs make it unreasonable for this patient to be served in a less intensive setting? Yes 2. Co-Morbidities requiring supervision/potential complications: acute respiratory failure 3. Due to bladder management, bowel management, safety, skin/wound care, disease management, medication administration, pain management and patient education, does the patient require 24 hr/day rehab nursing? Yes 4. Does the patient require coordinated care  of a physician, rehab nurse, PT (1-2 hrs/day, 5 days/week), OT (1-2 hrs/day, 5 days/week) and SLP (1-2 hrs/day, 5 days/week) to address physical and functional deficits in the context of the above medical diagnosis(es)? Yes Addressing deficits in the following areas: balance, endurance, locomotion, strength, transferring, bowel/bladder control, bathing, dressing, feeding,  grooming, toileting, cognition, speech, language, swallowing and psychosocial support 5. Can the patient actively participate in an intensive therapy program of at least 3 hrs of therapy per day at least 5 days per week? Potentially 6. The potential for patient to make measurable gains while on inpatient rehab is good 7. Anticipated functional outcomes upon discharge from inpatient rehab are min to mod assist with PT, mod assist with OT, min to mod assist with SLP. 8. Estimated rehab length of stay to reach the above functional goals is: 20-28 days 9. Does the patient have adequate social supports to accommodate these discharge functional goals? Yes and Potentially 10. Anticipated D/C setting: Home 11. Anticipated post D/C treatments: Outpt therapy 12. Overall Rehab/Functional Prognosis: excellent  RECOMMENDATIONS: This patient's condition is appropriate for continued rehabilitative care in the following setting: CIR Patient has agreed to participate in recommended program. n/a Note that insurance prior authorization may be required for reimbursement for recommended care.  Comment: Rehab RN to follow up.   Ranelle Oyster, MD, [place] [facility]     09/27/2013

## 2013-09-27 NOTE — Progress Notes (Signed)
Stroke Team Progress Note  HISTORY 79 y.o. female with a past medical history significant for atrial fibrillation, brought to [facility] ED by ambulance as a code stroke. She was last known well at 9 am today 09/24/2013, talking to her husband and suddenly became less responsive, fell to the floor, and was EMS arrived shew was not able to move the left side and had dysarthria. Never had similar symptoms before. Initial NIHSS 19.  CT brain revealed questionable ill defined area of hypodensity left MCA territory as well as question minimal hyperdensity of the right carotid terminus/M1 segment right middle cerebral artery raises the possibility of thrombus at this level. She was a candidate for IV TPA and it was administered. She is alert and awake, following commands, and started improving in then ED after receiving the IV tpa bolus.  Given hyperdense right MCA and NIH stroke scale of 19, concern for large vessel embolus. Patient was taken by interventional neuroradiology for a cerebral angiogram which showed occlusion of the right MCA proximally. She was administered intra-arterial TPA he 10.8 mg and 2 passes of the solitaire retrieval device for a TICI3 revascularization. She was intubated for airway protection during the procedure. She was admitted to the neuro ICU for further evaluation and treatment.  SUBJECTIVE No family at bedside. Pt awake and following commands. Lives with boyfriend/exhusband. Per RN, he think he can care for her, but per her assessment, he cannot.  OBJECTIVE Most recent Vital Signs: Filed Vitals:   09/27/13 0400 09/27/13 0500 09/27/13 0600 09/27/13 0800  BP: 136/71 134/64 134/66 120/67  Pulse: 83 84 85 84  Temp: 97.7 F (36.5 C) 97.7 F (36.5 C) 97.7 F (36.5 C) 97.7 F (36.5 C)  TempSrc:      Resp: 31 27 33 31  Height:      Weight:      SpO2: 96% 93% 93% 93%   CBG (last 3)   Recent Labs  09/25/13 2159 09/25/13 2309 09/26/13 0503  GLUCAP 95 81 106*    IV Fluid  Intake:   . sodium chloride 45 mL/hr at 09/27/13 0800  . amiodarone (NEXTERONE PREMIX) 360 mg/200 mL dextrose 30 mg/hr (09/27/13 0800)  . diltiazem (CARDIZEM) infusion Stopped (09/27/13 0400)    MEDICATIONS  . aspirin  300 mg Rectal Daily  . atorvastatin  20 mg Oral q1800  . chlorhexidine  15 mL Mouth/Throat BID  . diltiazem  5 mg Intravenous Once  . enoxaparin (LOVENOX) injection  30 mg Subcutaneous Q24H  . influenza vac split quadrivalent PF  0.5 mL Intramuscular Tomorrow-1000  . pantoprazole (PROTONIX) IV  40 mg Intravenous QHS  . pneumococcal 23 valent vaccine  0.5 mL Intramuscular Tomorrow-1000   PRN:  fentaNYL, labetalol, senna-docusate  Diet:  NPO  Activity:   Up in chair DVT Prophylaxis:  Lovenox 30 mg sq daily, SCDs  CLINICALLY SIGNIFICANT STUDIES Basic Metabolic Panel:   Recent Labs Lab 09/26/13 0530 09/26/13 1325  NA 141 141  K 3.6 4.3  CL 106 105  CO2 20 15*  GLUCOSE 99 106*  BUN 20 21  CREATININE 0.90 0.87  CALCIUM 8.7 8.9  MG  --  2.0  PHOS  --  2.6   Liver Function Tests:   Recent Labs Lab 09/24/13 1105 09/26/13 1325  AST 89* 109*  ALT 91* 94*  ALKPHOS 115 90  BILITOT 0.6 0.5  PROT 6.4 5.8*  ALBUMIN 2.8* 2.4*   CBC:   Recent Labs Lab 09/24/13 1800 09/25/13 0520 09/26/13  0530  WBC 9.8 11.0* 11.2*  NEUTROABS 8.6* 9.3*  --   HGB 9.4* 9.0* 9.8*  HCT 27.9* 26.6* 30.1*  MCV 92.1 92.4 93.5  PLT 293 275 339   Coagulation:   Recent Labs Lab 09/24/13 1105  LABPROT 14.3  INR 1.13   Cardiac Enzymes:   Recent Labs Lab 09/24/13 1105  TROPONINI <0.30   Urinalysis:   Recent Labs Lab 09/24/13 1143  COLORURINE YELLOW  LABSPEC 1.020  PHURINE 5.5  GLUCOSEU NEGATIVE  HGBUR NEGATIVE  BILIRUBINUR SMALL*  KETONESUR 15*  PROTEINUR NEGATIVE  UROBILINOGEN 1.0  NITRITE NEGATIVE  LEUKOCYTESUR NEGATIVE   Lipid Panel    Component Value Date/Time   CHOL 109 09/25/2013 0520   TRIG 78 09/25/2013 0520   HDL 26* 09/25/2013 0520   CHOLHDL  4.2 09/25/2013 0520   VLDL 16 09/25/2013 0520   LDLCALC 67 09/25/2013 0520   HgbA1C  Lab Results  Component Value Date   HGBA1C 6.1* 09/25/2013    Urine Drug Screen:     Component Value Date/Time   LABOPIA NONE DETECTED 09/24/2013 1143   COCAINSCRNUR NONE DETECTED 09/24/2013 1143   LABBENZ NONE DETECTED 09/24/2013 1143   AMPHETMU NONE DETECTED 09/24/2013 1143   THCU NONE DETECTED 09/24/2013 1143   LABBARB NONE DETECTED 09/24/2013 1143    Alcohol Level:   Recent Labs Lab 09/24/13 1105  ETH <11    CT of the brain  09/25/2013    No intracranial hemorrhage.  Right middle cerebral artery distribution infarct is better visualize with indistinctness of the gray white junction of the right frontal operculum region and indistinctness of the right lenticular nucleus/right caudate head.    09/24/2013    Mild evolution of a right middle cerebral artery distribution acute infarct. No intracranial hemorrhage.   09/24/2013    Indistinct appearance of the right basal ganglia, right caudate, right subinsular region, right peri operculum region and portion of the right frontal lobe suggestive of a right middle cerebral artery acute infarct involving over 1/3 of the distribution of the right middle cerebral artery.  Question minimal hyperdensity of the right carotid terminus/M1 segment right middle cerebral artery raises the possibility of thrombus at this level.  No intracranial hemorrhage currently noted.    Cerebral Angio 09/24/2013 S/P rt common carotid arteriogram followed by TICI 3 Revascularization of occluded RT MCA prox using 10.8 mg of IA tpa and 2 passes with the 4 x 40 mm Solitaire FR retrieval device.   MRI of the brain    MRA of the brain    2D Echocardiogram  EF 30-35% with no source of embolus.   Carotid Doppler  No evidence of hemodynamically significant internal carotid artery stenosis. Vertebral artery flow is antegrade.   CXR   09/26/2013    Persistent pulmonary edema and bibasilar  effusions.  Increased atelectasis versus consolidation left lower lobe.  09/24/2013    1. Status post intubation.  Tip of tube is of above the carina. 2. Moderate pulmonary edema.   EKG  normal sinus rhythm, LBBB, PAC's noted.   Therapy Recommendations CIR  Physical Exam   Mental Status:  Sedated on popofol  Cranial Nerves:  II: Discs flat bilaterally; left visual filed defect, pupils equal, round, reactive to light and accommodation  III,IV, VI: ptosis not present, right gaze preference.  V,VII: smile asymmetric with left face weakness, facial light touch sensation normal bilaterally  VIII: hearing normal bilaterally  IX,X: gag reflex present  XI: bilateral shoulder shrug  XII: midline tongue extension without atrophy or fasciculations  Motor:  Left sided hemiplegia  Tone and bulk:normal tone throughout; no atrophy noted  Sensory: Pinprick and light touch intact throughout, bilaterally  Deep Tendon Reflexes:  Right: Upper Extremity Left: Upper extremity  biceps (C-5 to C-6) 2/4 biceps (C-5 to C-6) 2/4  tricep (C7) 2/4 triceps (C7) 2/4  Brachioradialis (C6) 2/4 Brachioradialis (C6) 2/4  Lower Extremity Lower Extremity  quadriceps (L-2 to L-4) 2/4 quadriceps (L-2 to L-4) 2/4  Achilles (S1) 2/4 Achilles (S1) 2/4  Plantars:  Right: downgoing Left: upgoing  Cerebellar:  normal finger-to-nose, normal heel-to-shin test  Gait:  No tested.    ASSESSMENT Dorothy [name] is a 79 y.o. female presenting with left hemiparesis and dysarthria. Status post IV t-PA 1147 at 09/24/2013 followed by revascularization of occluded proximal R  MCA prox using IA tpa and Solitaire retrieval device. Imaging confirms a right MCA infarct. Infarct felt to be  embolic secondary to atrial fibrillation.  On aspirin 81 mg orally every day prior to admission. Now on aspirin 325 mg orally every day for secondary stroke prevention. Patient with resultant left hemiparesis, dysphagia. Work up  underway.  Intubation for neurointervention only, extubated. sats low 80%, on 4L overnight. Concern for aspiration. No temp, fever. atrial fibrillation, unknown if new or old, not on anticoagulation prior to admission, maximized IV diltiazem for rate control. Amiodarone added w/ QRS widening, now back in NSR  CHA2DS2-VASc Score for Atrial Fibrillation Stroke Risk = 7   Age in Years:   ?89 (+56)    Sex:   Female (+1)    Congestive Heart Failure History:  yes (+1 )   Hypertension History:  yes (+1 )   Stroke/TIA/Thromboembolism History:  yes (+2 )   Vascular Disease History:  no   Diabetes Mellitus: no Hypertension, on diazide prior to admission Elevated liver enzymes, mild Anemia, acute blood loss post neurointervention and dehydration 12.2->9.8 Thyroid disease, on synthroid PTA Dehydration, Cr 1.03->0.87 Hgb 9.8  Hospital day # 3  TREATMENT/PLAN  Continue ICU level care given amiodarone drip - discussed with Dr. Tyson Alias  Continue aspirin 325 mg orally every day for secondary stroke prevention. Will consider treatment with anticoagulation based on pt progression, stabilization, once able to swallow.  ST to follow swallow  NS at 45 cc/hr  OOB. Therapy evals  F/u MRI. Cancel MRA  CBC, BMET in am  Annie Main, MSN, RN, ANVP-BC, ANP-BC, [facility] Pager: [phone] 09/27/2013 8:31 AM  I have personally obtained a history, examined the patient, evaluated imaging results, and formulated the assessment and plan of care. I agree with the above.  Delia Heady, MD

## 2013-09-27 NOTE — Progress Notes (Signed)
Rehab Admissions Coordinator Note:  Patient was screened by Trish Mage for appropriateness for an Inpatient Acute Rehab Consult.  At this time, we are recommending Inpatient Rehab consult.  Trish Mage 09/27/2013, 1:48 PM  I can be reached at [phone].

## 2013-09-27 NOTE — Evaluation (Signed)
Physical Therapy Evaluation Patient Details Name: Dorothy [name] MRN: [id number] DOB: 1931-03-31 Today's Date: 09/27/2013 Time: 1027-2536 PT Time Calculation (min): 32 min  PT Assessment / Plan / Recommendation History of Present Illness  Pt found to have R MCA infarct  Clinical Impression  Pt participatory and able to follow commands. Pt with noted L sided weakness UE > LE, L sided neglect and balance impairment. Pt was indep. PTA and has potential 24/7 support upon d/c. Pt demo's excellent rehab potential and would be an excellent candidate for CIR upon d/c to achieve maximal functional recovery.    PT Assessment  Patient needs continued PT services    Follow Up Recommendations  CIR    Does the patient have the potential to tolerate intense rehabilitation      Barriers to Discharge  (questionable support)      Equipment Recommendations   (TBD)    Recommendations for Other Services Rehab consult   Frequency Min 4X/week    Precautions / Restrictions Precautions Precautions: Fall Restrictions Weight Bearing Restrictions: No   Pertinent Vitals/Pain Denies pain      Mobility  Bed Mobility Bed Mobility: Supine to Sit Supine to Sit: HOB elevated;1: +2 Total assist Supine to Sit: Patient Percentage: 20% Details for Bed Mobility Assistance: max verbal and tactile directional cues, maxA for trunk elevation. pt initiated LE mvmt Transfers Transfers: Sit to Stand;Stand to Sit;Stand Pivot Transfers Sit to Stand: 1: +1 Total assist;With upper extremity assist;From bed (2nd person for line management) Stand to Sit: 1: +1 Total assist;With upper extremity assist;To chair/3-in-1 Stand Pivot Transfers: 1: +1 Total assist Details for Transfer Assistance: pt initiated stand. support at L LE due to weakness. Pt unable to take step this date without maximal cueing/assist Ambulation/Gait Ambulation/Gait Assistance: Not tested (comment) Modified Rankin (Stroke Patients  Only) Pre-Morbid Rankin Score: No symptoms Modified Rankin: Severe disability    Exercises General Exercises - Lower Extremity Long Arc Quad: AROM;Left;5 reps;Seated   PT Diagnosis:    PT Problem List: Decreased strength;Decreased activity tolerance;Decreased balance;Decreased mobility;Decreased coordination PT Treatment Interventions: DME instruction;Gait training;Stair training;Functional mobility training;Therapeutic activities;Therapeutic exercise     PT Goals(Current goals can be found in the care plan section) Acute Rehab PT Goals Patient Stated Goal: didn't report PT Goal Formulation: With patient Time For Goal Achievement: 10/04/13 Potential to Achieve Goals: Good  Visit Information  Last PT Received On: 09/27/13 Assistance Needed: +2 (for OOB mobility/amb) PT/OT/SLP Co-Evaluation/Treatment: Yes PT goals addressed during session: Mobility/safety with mobility History of Present Illness: Pt found to have R MCA infarct       Prior Functioning  Home Living Family/patient expects to be discharged to:: Private residence Living Arrangements: Spouse/significant other Available Help at Discharge: Family;Available 24 hours/day (questionable if sig other can care for pt) Type of Home: House Home Access: Stairs to enter;Ramped entrance Home Layout: One level Home Equipment: None  Lives With: Spouse Prior Function Level of Independence: Independent Comments: cared for other people Communication Communication: Expressive difficulties Dominant Hand: Right    Cognition  Cognition Arousal/Alertness: Lethargic Behavior During Therapy: Flat affect Overall Cognitive Status: Impaired/Different from baseline Area of Impairment: Problem solving;Orientation Orientation Level: Situation (pt thinks she's at [employer]) Problem Solving: Slow processing;Requires verbal cues;Requires tactile cues    Extremity/Trunk Assessment Upper Extremity Assessment Upper Extremity Assessment:  Defer to OT evaluation (L UE weakness) Lower Extremity Assessment Lower Extremity Assessment: LLE deficits/detail LLE Deficits / Details: grossly 3+/5 Cervical / Trunk Assessment Cervical / Trunk Assessment: Normal  Balance Balance Balance Assessed: Yes Static Sitting Balance Static Sitting - Balance Support: Right upper extremity supported;Feet supported Static Sitting - Level of Assistance: 3: Mod assist;2: Max assist Static Sitting - Comment/# of Minutes: Pt with R Lateral lean/pushing to R side. Pt requiring max tactile cues to obtain midline position.  End of Session PT - End of Session Equipment Utilized During Treatment: Gait belt Activity Tolerance: Patient tolerated treatment well Patient left: in chair;with call bell/phone within reach;with family/visitor present Nurse Communication: Mobility status  GP     Marcene Brawn 09/27/2013, 11:55 AM   Lewis Shock, PT, DPT Pager #: [phone] Office #: [phone]

## 2013-09-27 NOTE — Progress Notes (Signed)
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 Pt on cardizem drip - received OK from MD and nsg to get pt OOB to chair.Pt presents with L hemiparesis, postural control deficits with R bias, apparent L visual field deficit, L inattention, & visual spatial deficits resulting in pt requiring Max to total A for ADL and functional mobility for ADL. Feel pt will benefit from CIR to maximize functional level of independence and decrease caregiver support. Pt states that she has a nephew who will assist in her care. Pt very motivated to return to higher level of function. Pt will benefit from skilled OT services to facilitate D/C to next venue due to below deficits. Will further assess visual deficits when more alert.    OT Assessment  Patient needs continued OT Services    Follow Up Recommendations  CIR    Barriers to Discharge Other (comment) (unsure of level of support)    Equipment Recommendations  3 in 1 bedside comode;Tub/shower bench    Recommendations for Other Services Rehab consult  Frequency  Min 3X/week    Precautions / Restrictions Precautions Precautions: Fall Precaution Comments: L inattention Restrictions Weight Bearing Restrictions: No   Pertinent Vitals/Pain BP stable. RR 30s. Improved to low 20s when sitting OOB in chair.    ADL  Eating/Feeding: Other (comment);NPO (to be assessed by speech) Grooming: Moderate assistance Where Assessed - Grooming: Supported sitting Upper Body Bathing: Maximal assistance Where Assessed - Upper Body Bathing: Supported sitting Lower Body Bathing: Maximal assistance Where Assessed - Lower Body Bathing:  Supported sit to stand Upper Body Dressing: +1 Total assistance Where Assessed - Upper Body Dressing: Supported sitting Lower Body Dressing: +1 Total assistance Where Assessed - Lower Body Dressing: Supported sit to Art therapist Transfer: +2 Total assistance;Simulated (L bias. Difficulty with upright posture) Toilet Transfer: Patient Percentage: 40% Toileting - Clothing Manipulation and Hygiene: Maximal assistance Equipment Used: Gait belt Transfers/Ambulation Related to ADLs: +2 total A at this time stad pivot only ADL Comments: significant decline fin funciton    OT Diagnosis: Generalized weakness;Cognitive deficits;Disturbance of vision;Hemiplegia non-dominant side  OT Problem List: Decreased strength;Decreased range of motion;Decreased activity tolerance;Impaired balance (sitting and/or standing);Impaired vision/perception;Decreased coordination;Decreased cognition;Decreased safety awareness;Decreased knowledge of use of DME or AE;Cardiopulmonary status limiting activity;Impaired sensation;Impaired tone;Impaired UE functional use;Increased edema OT Treatment Interventions: Self-care/ADL training;Therapeutic exercise;Neuromuscular education;DME and/or AE instruction;Therapeutic activities;Cognitive remediation/compensation;Visual/perceptual remediation/compensation;Patient/family education;Balance training   OT Goals(Current goals can be found in the care plan section) Acute Rehab OT Goals Patient Stated Goal: to do more for myself OT Goal Formulation: With patient Time For Goal Achievement: 10/11/13 Potential to Achieve Goals: Good  Visit Information  Last OT Received On: 09/27/13 Assistance Needed: +2 (for OOB mobility/amb) PT/OT/SLP Co-Evaluation/Treatment: Yes PT goals addressed during session: Mobility/safety with mobility OT goals addressed during session: ADL's and self-care History of Present Illness: Pt found to have R MCA infarct       Prior Functioning     Home  Living Family/patient expects to be discharged to:: Private residence Living Arrangements: Spouse/significant other Available Help at Discharge: Family;Available 24 hours/day (questionable if sig other can care for pt) Type of Home: House Home Access: Stairs to enter;Ramped entrance  Home Layout: One level Home Equipment: None  Lives With: Spouse Prior Function Level of Independence: Independent Comments: cared for other people Communication Communication: Expressive difficulties (dysarthric) Dominant Hand: Right         Vision/Perception Vision - History Baseline Vision: Wears glasses only for reading Patient Visual Report: Blurring of vision Vision - Assessment Eye Alignment: Within Functional Limits Vision Assessment: Vision tested Ocular Range of Motion: Impaired-to be further tested in functional context;Restricted on the left Alignment/Gaze Preference: Head turned;Gaze right Tracking/Visual Pursuits: Decreased smoothness of eye movement to LEFT superior field;Decreased smoothness of eye movement to LEFT inferior field;Requires cues, head turns, or add eye shifts to track;Impaired - to be further tested in functional context;Decreased smoothness of horizontal tracking Saccades: Additional head turns occurred during testing;Decreased speed of saccadic movement;Impaired - to be further tested in functional context Convergence: Impaired - to be further tested in functional context Visual Fields: Impaired - to be further tested in functional context;Left visual field deficit (will further assess when more alert) Additional Comments: R gaze preference Perception Perception: Impaired Inattention/Neglect: Does not attend to left visual field;Impaired-to be further tested in functional context Spatial Orientation: apparent unorganized use of space. R bias Praxis Praxis:  (will further assess. following commands. proper use of objec)   Cognition  Cognition Arousal/Alertness:  Lethargic Behavior During Therapy: Flat affect Overall Cognitive Status: Impaired/Different from baseline Area of Impairment: Problem solving;Orientation;Safety/judgement;Awareness Orientation Level: Time (pt thinks she's at [facility]) Safety/Judgement: Decreased awareness of safety;Decreased awareness of deficits Awareness: Intellectual Problem Solving: Slow processing;Requires verbal cues;Requires tactile cues General Comments: Cognitive deficits partly due to lethargy?    Extremity/Trunk Assessment Upper Extremity Assessment Upper Extremity Assessment: LUE deficits/detail;RUE deficits/detail RUE Deficits / Details: apparent RTC insufficiency. limited shoulder flexion and abduction. @ 20 degrees LUE Deficits / Details: Brunstrom level 1 - synergy  pattern developing. Low tone. edema - greater distally LUE Sensation: decreased proprioception LUE Coordination: decreased fine motor;decreased gross motor (minimal gross grasp) Lower Extremity Assessment Lower Extremity Assessment: Defer to PT evaluation LLE Deficits / Details: grossly 3+/5 Cervical / Trunk Assessment Cervical / Trunk Assessment: Other exceptions Cervical / Trunk Exceptions: forward head with head tilted down. mod A to maintain upright posture. Initially pushing to R. after properly positioning hips, posture improved     Mobility Bed Mobility Bed Mobility: Supine to Sit Supine to Sit: HOB elevated;1: +2 Total assist Supine to Sit: Patient Percentage: 20% Details for Bed Mobility Assistance: max verbal and tactile directional cues, maxA for trunk elevation. pt initiated LE mvmt Transfers Sit to Stand: 1: +1 Total assist;With upper extremity assist;From bed (2nd person for line management) Stand to Sit: 1: +1 Total assist;With upper extremity assist;To chair/3-in-1 Details for Transfer Assistance: pt initiated stand. support at L LE due to weakness. Pt unable to take step this date without maximal cueing/assist      Exercise General Exercises - Lower Extremity Long Arc Quad: AROM;Left;5 reps;Seated   Balance Balance Balance Assessed: Yes Static Sitting Balance Static Sitting - Balance Support: Right upper extremity supported;Feet supported Static Sitting - Level of Assistance: 3: Mod assist;2: Max assist Static Sitting - Comment/# of Minutes: Pt with R Lateral lean/pushing to R side. Pt requiring max tactile cues to obtain midline position.   End of Session OT - End of Session Equipment Utilized During Treatment: Gait belt;Oxygen Activity Tolerance: Patient limited by lethargy Patient left: in chair;with call bell/phone within reach;with family/visitor present Nurse Communication: Mobility status;Other (comment) (L inattention)  GO  Niala Stcharles,HILLARY 09/27/2013, 12:56 PM [facility], OTR/L  [phone] 09/27/2013

## 2013-09-27 NOTE — Progress Notes (Signed)
Speech Language Pathology Treatment: Dysphagia;Cognitive-Linquistic  Patient Details Name: Dorothy [name] MRN: [id number] DOB: 1931/10/12 Today's Date: 09/27/2013 Time: 4098-1191 SLP Time Calculation (min): 20 min  Assessment / Plan / Recommendation Clinical Impression  Pt continues to demonstrate overt evidence of aspiration with nectar thick liquids. Trials of honey and puree via teaspoon are improved from yesterday, still eliciting a few multiple swallow, but no late cough or throat clear. Pt may be able to tolerate a modified diet and avoid NG tube placement. Her arousal was sustained throughout session today. Will proceed with MBS to determine safety with PO.    HPI HPI: 79 yo female with atrial fibrillation who presented with left hemiparesis and dysarthria, now status post cerebral angiogram with revascularization of occluded right MCA.   Pertinent Vitals NA  SLP Plan  MBS    Recommendations Diet recommendations: NPO              General recommendations: Rehab consult Oral Care Recommendations: Oral care Q4 per protocol Follow up Recommendations: Inpatient Rehab Plan: MBS    GO    Harlon Ditty, MA CCC-SLP [phone]  Dorothy [name] 09/27/2013, 12:07 PM

## 2013-09-27 NOTE — Progress Notes (Addendum)
PULMONARY  / CRITICAL CARE MEDICINE CONSULTATION  Name: Dorothy [name] MRN: [id number] DOB: Sep 03, 1931    ADMISSION DATE:  09/24/2013  CHIEF COMPLAINT:  Stroke   BRIEF PATIENT DESCRIPTION: 79 yo female with atrial fibrillation who presented with left hemiparesis and dysarthria, now status post cerebral angiogram with revascularization of occluded right MCA.  She developed AFIB now need Amiodarone for rhythm control and fail feeding trail  SIGNIFICANT EVENTS / STUDIES:  1. CT Head 09/24/2013 11:34 - suggestive of R MCA acute infarct, no intracranial hemorrhage 2. Alteplase 09/24/2013 11:45 3. IR R common carotid arteriogram, revascularization of occluded R MCA with tPA and 2 passes with retrieval device 09/24/2013 4. CT Head 09/24/2013 17:34 - mild evolution R MCA acute infarct, no intracranial hemorrhage 5. Intubation for procedure 09/24/2013, no extubated sine 12/07  LINES / TUBES: 1. ETT 09/24/2013 >>> 12/07  2. L radial art line 09/24/2013 >>> 3. R femoral art line 09/24/2013 >>> 12/07  CULTURES: None  ANTIBIOTICS: 1. Cefazolin 2 g IV x 1 12/6/201  SUBJECTIVE:  Seen and examined.  Patient interactive but does not seem to be motivated. Rate controlled well  VITAL SIGNS: Temp:  [97.7 F (36.5 C)-98.4 F (36.9 C)] 97.7 F (36.5 C) (12/09 0800) Pulse Rate:  [52-143] 84 (12/09 0800) Resp:  [18-34] 31 (12/09 0800) BP: (103-136)/(53-84) 120/67 mmHg (12/09 0800) SpO2:  [88 %-100 %] 93 % (12/09 0800) Arterial Line BP: (121-133)/(49-55) 130/55 mmHg (12/08 1115)   VENTILATOR SETTINGS:    INTAKE / OUTPUT: Intake/Output     12/08 0701 - 12/09 0700 12/09 0701 - 12/10 0700   I.V. (mL/kg) 1600.5 (32.7) 123.4 (2.5)   IV Piggyback     Total Intake(mL/kg) 1600.5 (32.7) 123.4 (2.5)   Urine (mL/kg/hr) 695 (0.6)    Total Output 695     Net +905.5 +123.4          PHYSICAL EXAMINATION: General:  Elderly and tire appearing female in no distress but appears drowsy HEENT: NCAT, EOMI, no eye  crust/discharge Neck:  No JVD noted  Cardiovascular:  NSR, no murmur/rub/gallop Lungs:  Bilateral rhonchi anteriorly Abdomen:  +BS, soft, nontender, nondistended Musculoskeletal:  No clubbing or cyanosis, not following command with left arm and hand Skin:  No rash noted  LABS:  CBC Recent Labs     09/24/13  1800  09/25/13  0520  09/26/13  0530  WBC  9.8  11.0*  11.2*  HGB  9.4*  9.0*  9.8*  HCT  27.9*  26.6*  30.1*  PLT  293  275  339    Coag's Recent Labs     09/24/13  1105  APTT  28  INR  1.13    BMET Recent Labs     09/25/13  0520  09/26/13  0530  09/26/13  1325  NA  139  141  141  K  3.1*  3.6  4.3  CL  102  106  105  CO2  21  20  15*  BUN  24*  20  21  CREATININE  1.03  0.90  0.87  GLUCOSE  93  99  106*    Electrolytes Recent Labs     09/25/13  0520  09/26/13  0530  09/26/13  1325  CALCIUM  8.0*  8.7  8.9  MG   --    --   2.0  PHOS   --    --   2.6    Sepsis Markers No results found  for this basename: LACTICACIDVEN, PROCALCITON, O2SATVEN,  in the last 72 hours  ABG Recent Labs     09/24/13  1453  09/24/13  2125  09/25/13  0430  PHART  7.258*  7.506*  7.474*  PCO2ART  39.9  28.2*  30.5*  PO2ART  151.0*  256.0*  109.0*    Liver Enzymes Recent Labs     09/24/13  1105  09/26/13  1325  AST  89*  109*  ALT  91*  94*  ALKPHOS  115  90  BILITOT  0.6  0.5  ALBUMIN  2.8*  2.4*    Cardiac Enzymes Recent Labs     09/24/13  1105  TROPONINI  <0.30    Glucose Recent Labs     09/25/13  0231  09/25/13  0432  09/25/13  0750  09/25/13  2159  09/25/13  2309  09/26/13  0503  GLUCAP  87  83  84  95  81  106*    Imaging Ct Head Wo Contrast  09/25/2013   CLINICAL DATA:  Post tPA for left-sided weakness.  EXAM: CT HEAD WITHOUT CONTRAST  TECHNIQUE: Contiguous axial images were obtained from the base of the skull through the vertex without intravenous contrast.  COMPARISON:  09/24/2013 CT and catheter angiogram.  04/08/2011 CT   FINDINGS: No intracranial hemorrhage.  Right middle cerebral artery distribution infarct is better visualize with indistinctness of the gray white junction of the right frontal operculum region and indistinctness of the right lenticular nucleus/right caudate head.  No intracranial mass lesion noted on this unenhanced exam.  IMPRESSION: No intracranial hemorrhage.  Right middle cerebral artery distribution infarct is better visualize with indistinctness of the gray white junction of the right frontal operculum region and indistinctness of the right lenticular nucleus/right caudate head.   Electronically Signed   By: Bridgett Larsson M.D.   On: 09/25/2013 12:38   Dg Chest Port 1 View  09/26/2013   CLINICAL DATA:  Respiratory failure, followup  EXAM: PORTABLE CHEST - 1 VIEW  COMPARISON:  Portable exam 0608 hr compared to 09/24/2013  FINDINGS: Endotracheal and nasogastric tubes no longer identified.  Upper normal heart size.  Pulmonary vascular congestion.  Diffuse interstitial infiltrates favor pulmonary edema.  Underlying COPD with small bibasilar pleural effusions.  Atelectasis versus consolidation in left lower lobe increased since previous exam.  No pneumothorax.  Bones demineralized with chronic tear right rotator cuff.  IMPRESSION: Persistent pulmonary edema and bibasilar effusions.  Increased atelectasis versus consolidation left lower lobe.   Electronically Signed   By: Ulyses Southward M.D.   On: 09/26/2013 07:22     CXR:  12/08 Interstitial edema  ASSESSMENT / PLAN:  79 yo female with atrial fibrillation, presenting with R MCA stroke s/p tPA and R common carotid arteriogram with revascularization of prox R MCA, now on mechanical ventilation post-procedure. Extubate 12/07.  #. Right MCA Stroke  --> s/p tPA, anteriogram with revascularization  --> Left UE weakness remains - Lipid panel checked with low HDL - ECHO completed 12/08, with decrease EF at 30-35%, see renal/pulm - ASA, Lipitor - Continue to  fail swallow evaluation - Consider tube feed - PT/OT/ST ordered -MRi then neuro will decide on anticoagulation  #. New Onset Atrial Fibrillation - Now with rhythm control with Amiodarone - Blood pressure is stable with rhythm control - s/p tPA and currently on DVT prophylaxis dose of Lovenox - CHA2DS2 VASc score 6, will need long term anticoagulation, for MRI - On ASA  and Lovenox -repeat lft this am on amio -no Cardizem required  #. Acute Anemia, dilution likely  --> Likely due to blood loss and dilution  --> Admission hgb 12.2 now at 9.8 - am CBC - Transfuse if level drop below 7gm/dL  #. Interstial Edema/PAH  --> Secondary to AFIB w/ RVR and positive balance  --> ECHO with decreased EF at 30-35% - Will start Lasix -pcxr in am   #. Mild LFt elevation -assess hep panel -repeat lft now -if rise, assess ldh, amy, lip -likely not amio related  # Mild mixed nonag and ag acidosis  -repeat chem now  To stay in icu, MRI, may need feeds started, continue amio load, lasix   Mcarthur Rossetti. Tyson Alias, MD, FACP Pgr: [phone] [facility]

## 2013-09-28 ENCOUNTER — Inpatient Hospital Stay (HOSPITAL_COMMUNITY): Payer: Medicare Other

## 2013-09-28 DIAGNOSIS — I6340 Cerebral infarction due to embolism of unspecified cerebral artery: Secondary | ICD-10-CM

## 2013-09-28 LAB — GLUCOSE, CAPILLARY
Glucose-Capillary: 164 mg/dL — ABNORMAL HIGH (ref 70–99)
Glucose-Capillary: 184 mg/dL — ABNORMAL HIGH (ref 70–99)

## 2013-09-28 LAB — TYPE AND SCREEN
ABO/RH(D): A POS
Antibody Screen: NEGATIVE
Unit division: 0

## 2013-09-28 LAB — BASIC METABOLIC PANEL
BUN: 22 mg/dL (ref 6–23)
CO2: 23 mEq/L (ref 19–32)
Calcium: 8.7 mg/dL (ref 8.4–10.5)
Creatinine, Ser: 0.81 mg/dL (ref 0.50–1.10)
GFR calc Af Amer: 77 mL/min — ABNORMAL LOW (ref >90)
Glucose, Bld: 186 mg/dL — ABNORMAL HIGH (ref 70–99)
Potassium: 3.1 mEq/L — ABNORMAL LOW (ref 3.5–5.1)

## 2013-09-28 LAB — CBC WITH DIFFERENTIAL/PLATELET
Basophils Absolute: 0 10*3/uL (ref 0.0–0.1)
Basophils Relative: 0 % (ref 0–1)
Eosinophils Relative: 0 % (ref 0–5)
Lymphocytes Relative: 10 % — ABNORMAL LOW (ref 12–46)
MCHC: 32.4 g/dL (ref 30.0–36.0)
MCV: 95.4 fL (ref 78.0–100.0)
Monocytes Absolute: 1 10*3/uL (ref 0.1–1.0)
Neutro Abs: 9.3 10*3/uL — ABNORMAL HIGH (ref 1.7–7.7)
Platelets: 356 10*3/uL (ref 150–400)
RDW: 16 % — ABNORMAL HIGH (ref 11.5–15.5)
WBC: 11.6 10*3/uL — ABNORMAL HIGH (ref 4.0–10.5)

## 2013-09-28 LAB — HEPATITIS PANEL, ACUTE
HCV Ab: NEGATIVE
Hep A IgM: NONREACTIVE
Hep B C IgM: NONREACTIVE
Hepatitis B Surface Ag: NEGATIVE

## 2013-09-28 MED ORDER — POTASSIUM CHLORIDE 20 MEQ/15ML (10%) PO LIQD: 30.0000 meq | Freq: Once | ORAL | Status: DC

## 2013-09-28 MED ORDER — PANTOPRAZOLE SODIUM 40 MG PO PACK
40.0000 mg | PACK | Freq: Every day | ORAL | Status: DC
  Filled 2013-09-28 (×3): qty 20

## 2013-09-28 MED ORDER — POTASSIUM CHLORIDE CRYS ER 20 MEQ PO TBCR: 30.0000 meq | EXTENDED_RELEASE_TABLET | ORAL | Status: DC

## 2013-09-28 MED ORDER — FUROSEMIDE 10 MG/ML IJ SOLN
40.0000 mg | Freq: Three times a day (TID) | INTRAMUSCULAR | Status: AC
  Administered 2013-09-28 (×2): 40 mg via INTRAVENOUS
  Filled 2013-09-28 (×2): qty 4

## 2013-09-28 MED ORDER — POTASSIUM CHLORIDE 10 MEQ/100ML IV SOLN
10.0000 meq | INTRAVENOUS | Status: AC
  Administered 2013-09-28 (×4): 10 meq via INTRAVENOUS
  Filled 2013-09-28 (×4): qty 100

## 2013-09-28 MED ORDER — PANTOPRAZOLE SODIUM 40 MG PO TBEC: 40.0000 mg | DELAYED_RELEASE_TABLET | Freq: Every day | ORAL | Status: DC

## 2013-09-28 MED ORDER — DILTIAZEM HCL 30 MG PO TABS
30.0000 mg | ORAL_TABLET | Freq: Three times a day (TID) | ORAL | Status: DC
  Administered 2013-09-28 – 2013-09-29 (×3): 30 mg via ORAL
  Filled 2013-09-28 (×6): qty 1

## 2013-09-28 NOTE — Progress Notes (Signed)
Pt. Has had a couple of moments of being unarousable for several minutes and having what seems to be a "blank stare"  Pt has become arousable again directly there-after and back to previous neuro status.  Paged Annie Main, NP to notify of this.  Also spoke about addressing code status with patient and family and notified of pt still being in and out of a-fib.  EEG ordered at this time.  Will continue to monitor.

## 2013-09-28 NOTE — Progress Notes (Signed)
[facility] ADULT ICU REPLACEMENT PROTOCOL FOR AM LAB REPLACEMENT ONLY  The patient does apply for the [facility] Adult ICU Electrolyte Replacment Protocol based on the criteria listed below:   1. Is GFR >/= 40 ml/min? yes  Patient's GFR today is 67 2. Is urine output >/= 0.5 ml/kg/hr for the last 6 hours? yes Patient's UOP is 4 ml/kg/hr 3. Is BUN < 60 mg/dL? yes  Patient's BUN today is 22 4. Abnormal electrolyte(s):K 3.1 5. Ordered repletion with: per protocol 6. If a panic level lab has been reported, has the CCM MD in charge been notified? yes.   Physician:  Rollene Rotunda 09/28/2013 5:53 AM

## 2013-09-28 NOTE — Progress Notes (Signed)
Speech Language Pathology Treatment: Dysphagia  Patient Details Name: Dorothy [name] MRN: [id number] DOB: 11-24-1930 Today's Date: 09/28/2013 Time: 4098-1191 SLP Time Calculation (min): 20 min  Assessment / Plan / Recommendation Clinical Impression  Pt completed base of tongue and pharyngeal strengthening exercises with min verbal cues. Tolerated trials of pudding think liquids well, with effortful swallows. Will continue to follow for readiness for repeat MBS.    HPI HPI: 79 yo female with atrial fibrillation who presented with left hemiparesis and dysarthria, now status post cerebral angiogram with revascularization of occluded right MCA.   Pertinent Vitals NA  SLP Plan  Continue with current plan of care    Recommendations Diet recommendations: Pudding-thick liquid Liquids provided via: Teaspoon Medication Administration: Crushed with puree Supervision: Staff to assist with self feeding;Full supervision/cueing for compensatory strategies Compensations: Slow rate;Small sips/bites;Multiple dry swallows after each bite/sip;Effortful swallow Postural Changes and/or Swallow Maneuvers: Seated upright 90 degrees              General recommendations: Rehab consult Oral Care Recommendations: Oral care BID Follow up Recommendations: Inpatient Rehab Plan: Continue with current plan of care    GO    [facility], MA CCC-SLP [phone]  Claudine Mouton 09/28/2013, 11:36 AM

## 2013-09-28 NOTE — Progress Notes (Signed)
PULMONARY  / CRITICAL CARE MEDICINE CONSULTATION  Name: Dorothy [name] MRN: [id number] DOB: 21-Jun-1931    ADMISSION DATE:  09/24/2013  CHIEF COMPLAINT:  Stroke   BRIEF PATIENT DESCRIPTION: 79 yo female with atrial fibrillation who presented with left hemiparesis and dysarthria, now status post cerebral angiogram with revascularization of occluded right MCA.  Speech recommend puree diet at this time after swallow evaluation.  PT recommend inpatient rehabs.  Pending neurology final recommendation on anticoagulation.  She has AFIB that was rate controlled with Cardizem initially but then switch to Amiodarone due to borderline blood pressure.  She remains in an out of afib with rate control but had rate in the 120s yesterday evening, thus, Cardizem was restarted.  SIGNIFICANT EVENTS / STUDIES:  1. CT Head 09/24/2013 11:34 - suggestive of R MCA acute infarct, no intracranial hemorrhage 2. Alteplase 09/24/2013 11:45 3. IR R common carotid arteriogram, revascularization of occluded R MCA with tPA and 2 passes with retrieval device 09/24/2013 4. CT Head 09/24/2013 17:34 - mild evolution R MCA acute infarct, no intracranial hemorrhage 5. Intubation for procedure 09/24/2013, no extubated sine 12/07 6. MRI 09/27/13 >>> right MCA nonhemorrhagic infarct with mild local mass effect and no midline shift  LINES / TUBES: 1. ETT 09/24/2013 >>> 12/07  2. L radial art line 09/24/2013 >>> 3. R femoral art line 09/24/2013 >>> 12/07  CULTURES: None  ANTIBIOTICS: 1. Cefazolin 2 g IV x 1 12/6/201  SUBJECTIVE:  Seen and examined.  Drowsy and sleepy but answered questions.  Mental status remains unchanged from yesterday.  Heart monitor with rate control intermittent afib.  VITAL SIGNS: Temp:  [97.6 F (36.4 C)-99 F (37.2 C)] 98.8 F (37.1 C) (12/10 0700) Pulse Rate:  [26-109] 79 (12/10 0700) Resp:  [19-34] 27 (12/10 0700) BP: (104-131)/(58-83) 116/60 mmHg (12/10 0700) SpO2:  [84 %-100 %] 98 % (12/10  0700)   VENTILATOR SETTINGS:    INTAKE / OUTPUT: Intake/Output     12/09 0701 - 12/10 0700 12/10 0701 - 12/11 0700   I.V. (mL/kg) 1546.5 (31.6)    Total Intake(mL/kg) 1546.5 (31.6)    Urine (mL/kg/hr) 2200 (1.9)    Total Output 2200     Net -653.5            PHYSICAL EXAMINATION: General:  Elderly and tire appearing female in no distress but appears drowsy and sleepy HEENT: NCAT, EOMI, no eye crust/discharge Neck:  No JVD noted  Cardiovascular:  NSR, no murmur/rub/gallop Lungs:  Bilateral rhonchi anteriorly Abdomen:  +BS, soft, nontender, nondistended Musculoskeletal:  Intact, no edema Skin:  No rash noted   LABS:  CBC Recent Labs     09/26/13  0530  09/28/13  0345  WBC  11.2*  11.6*  HGB  9.8*  10.8*  HCT  30.1*  33.3*  PLT  339  356    Coag's No results found for this basename: APTT, INR,  in the last 72 hours  BMET Recent Labs     09/26/13  1325  09/27/13  1045  09/28/13  0345  NA  141  144  142  K  4.3  4.1  3.1*  CL  105  106  106  CO2  15*  17*  23  BUN  21  23  22   CREATININE  0.87  0.81  0.81  GLUCOSE  106*  155*  186*    Electrolytes Recent Labs     09/26/13  1325  09/27/13  1045  09/28/13  0345  CALCIUM  8.9  8.9  8.7  MG  2.0   --    --   PHOS  2.6   --    --     Sepsis Markers No results found for this basename: LACTICACIDVEN, PROCALCITON, O2SATVEN,  in the last 72 hours  ABG No results found for this basename: PHART, PCO2ART, PO2ART,  in the last 72 hours  Liver Enzymes Recent Labs     09/26/13  1325  09/27/13  1045  AST  109*  72*  ALT  94*  78*  ALKPHOS  90  89  BILITOT  0.5  0.4  ALBUMIN  2.4*  2.6*    Cardiac Enzymes No results found for this basename: TROPONINI, PROBNP,  in the last 72 hours  Glucose Recent Labs     09/25/13  2159  09/25/13  2309  09/26/13  0503  09/27/13  1548  09/27/13  2007  09/27/13  2350  GLUCAP  95  81  106*  191*  177*  164*    Imaging Mr Brain Wo Contrast  09/27/2013    CLINICAL DATA:  Left hemiparesis post tPA and clot retrieval. Atrial fibrillation.  EXAM: MRI HEAD WITHOUT CONTRAST  TECHNIQUE: Multiplanar, multiecho pulse sequences of the brain and surrounding structures were obtained without intravenous contrast.  COMPARISON:  11/26/2012 CT.  No comparison MR.  FINDINGS: Large acute/ subacute right middle cerebral artery distribution infarct involving right frontal lobe, right anterior temporal lobe, right subinsular region, right peri operculum region, right parietal lobe, right lenticular nucleus and right caudate head.  No intracranial hemorrhage. There is mild local mass effect upon the right lateral ventricle without midline shift.  Atrophy without hydrocephalus.  No intracranial mass lesion noted on this unenhanced exam.  Transverse ligament hypertrophy. Cervical spondylotic changes without spinal stenosis. Cervical medullary junction, pituitary region, pineal region and orbital structures unremarkable.  Major intracranial vascular structures appear patent.  IMPRESSION: Large acute/subacute right middle cerebral artery distribution nonhemorrhagic infarct with mild local mass effect without midline shift as detailed above.  These results will be called to the ordering clinician or representative by the Radiologist Assistant, and communication documented in the PACS Dashboard.   Electronically Signed   By: Bridgett Larsson M.D.   On: 09/27/2013 15:00   Dg Chest Port 1 View  09/28/2013   CLINICAL DATA:  Pulmonary edema  EXAM: PORTABLE CHEST - 1 VIEW  COMPARISON:  09/26/2013  FINDINGS: Cardiac shadow is stable. Persistent bilateral changes of pulmonary edema are noted. These are worsened in the interval particularly on the right side. Small effusions are noted bilaterally. No focal confluent infiltrate is seen.  IMPRESSION: Worsening pulmonary edema.   Electronically Signed   By: Alcide Clever M.D.   On: 09/28/2013 08:08   Dg Swallowing Func-speech Pathology  09/27/2013    Riley Nearing [place], CCC-SLP     09/27/2013  2:04 PM Objective Swallowing Evaluation: Modified Barium Swallowing Study   Patient Details  Name: Dorothy [name] MRN: [id number] Date of Birth: 11-30-1930  Today's Date: 09/27/2013 Time: 1300-1330 SLP Time Calculation (min): 30 min  Past Medical History:  Past Medical History  Diagnosis Date  . Stroke   . Atrial fibrillation    Past Surgical History: History reviewed. No pertinent past  surgical history. HPI:  79 yo female with atrial fibrillation who presented with left  hemiparesis and dysarthria, now status post cerebral angiogram  with revascularization of occluded right MCA.     Assessment /  Plan / Recommendation Clinical Impression  Dysphagia Diagnosis: Moderate oral phase dysphagia;Severe  pharyngeal phase dysphagia Clinical impression: Pt presents with moderate to severe  dysphagia with left oral and lingual weakness leading to left  residuals and decreased bolus propulsion. Pharyngeal phase  characterized by weakness particularly of base of tongue and  hyolaryngeal elevation/excursion with limited opening of UES.  Suspect baseline UES hypertrophy. Pt with decreased closure of  airway during the swallow with penetration of liquid textures  with no sensation. There are moderate to severe resdiuals  remaining post swallow. Postures not effective in decreasing  residuals.  Recommend pt initiate a puree/pudding thick diet with the hopes  that use of the swallow mechanism will improve function. Pt may  need alternate method of nutrition if she does not sustain  arousal for meals and does not have appropriate intake. SLP hopes  to progress to liquid textures in a few days.     Treatment Recommendation  Therapy as outlined in treatment plan below    Diet Recommendation Dysphagia 1 (Puree);Pudding-thick liquid   Liquid Administration via: Spoon Medication Administration: Crushed with puree Supervision: Staff to assist with self feeding;Full  supervision/cueing for  compensatory strategies Compensations: Slow rate;Small sips/bites;Multiple dry swallows  after each bite/sip Postural Changes and/or Swallow Maneuvers: Seated upright 90  degrees    Other  Recommendations Oral Care Recommendations: Oral care BID Other Recommendations: Order thickener from pharmacy   Follow Up Recommendations  Inpatient Rehab    Frequency and Duration min 2x/week  2 weeks   Pertinent Vitals/Pain NA    SLP Swallow Goals     General HPI: 79 yo female with atrial fibrillation who presented  with left hemiparesis and dysarthria, now status post cerebral  angiogram with revascularization of occluded right MCA. Type of Study: Modified Barium Swallowing Study Reason for Referral: Objectively evaluate swallowing function Diet Prior to this Study: NPO Temperature Spikes Noted: No Respiratory Status: Nasal cannula History of Recent Intubation: Yes Length of Intubations (days): 2 days Date extubated: 09/25/13 Behavior/Cognition: Alert;Cooperative Oral Cavity - Dentition: Edentulous Oral Motor / Sensory Function: Impaired - see Bedside swallow  eval Self-Feeding Abilities: Total assist Patient Positioning: Upright in chair Baseline Vocal Quality: Clear Volitional Cough: Weak Volitional Swallow: Able to elicit Anatomy: Within functional limits Pharyngeal Secretions: Not observed secondary MBS    Reason for Referral Objectively evaluate swallowing function   Oral Phase Oral Preparation/Oral Phase Oral Phase: Impaired Oral - Honey Oral - Honey Teaspoon: Left anterior bolus loss;Weak lingual  manipulation;Left pocketing in lateral sulci;Lingual/palatal  residue;Delayed oral transit;Reduced posterior  propulsion;Incomplete tongue to palate contact Oral - Nectar Oral - Nectar Teaspoon: Left anterior bolus loss;Weak lingual  manipulation;Left pocketing in lateral sulci;Lingual/palatal  residue;Delayed oral transit;Reduced posterior  propulsion;Incomplete tongue to palate contact Oral - Solids Oral - Puree: Left  anterior bolus loss;Weak lingual  manipulation;Left pocketing in lateral sulci;Lingual/palatal  residue;Delayed oral transit;Reduced posterior  propulsion;Incomplete tongue to palate contact   Pharyngeal Phase Pharyngeal Phase Pharyngeal Phase: Impaired Pharyngeal - Honey Pharyngeal - Honey Teaspoon: Delayed swallow initiation;Reduced  pharyngeal peristalsis;Reduced epiglottic inversion;Reduced  anterior laryngeal mobility;Reduced laryngeal elevation;Reduced  airway/laryngeal closure;Reduced tongue base  retraction;Pharyngeal residue - valleculae;Pharyngeal residue -  pyriform sinuses;Penetration/Aspiration during  swallow;Compensatory strategies attempted (Comment) (chin tuck,  not effective) Penetration/Aspiration details (honey teaspoon): Material enters  airway, CONTACTS cords and not ejected out;Material does not  enter airway Pharyngeal - Nectar Pharyngeal - Nectar Teaspoon: Delayed swallow initiation;Reduced  pharyngeal peristalsis;Reduced epiglottic inversion;Reduced  anterior laryngeal mobility;Reduced laryngeal elevation;Reduced  airway/laryngeal closure;Reduced tongue base  retraction;Pharyngeal residue - valleculae;Pharyngeal residue -  pyriform sinuses;Penetration/Aspiration during swallow Penetration/Aspiration details (nectar teaspoon): Material enters  airway, passes BELOW cords without attempt by patient to eject  out (silent aspiration) Pharyngeal - Solids Pharyngeal - Puree: Delayed swallow initiation;Reduced pharyngeal  peristalsis;Reduced epiglottic inversion;Reduced anterior  laryngeal mobility;Reduced laryngeal elevation;Reduced  airway/laryngeal closure;Reduced tongue base  retraction;Pharyngeal residue - valleculae;Pharyngeal residue -  pyriform sinuses;Compensatory strategies attempted (Comment)  (chin tuck not effective. ) Penetration/Aspiration details (puree): Material does not enter  airway  Cervical Esophageal Phase    GO    Cervical Esophageal Phase Cervical Esophageal Phase: Impaired         Harlon Ditty, MA CCC-SLP [phone]  Dyanne Iha Riley Nearing 09/27/2013, 2:03 PM      CXR:  12/09 >>> Interval increase in pulmonary edema  ASSESSMENT / PLAN:  79 yo female with atrial fibrillation, presenting with R MCA stroke s/p tPA and R common carotid arteriogram with revascularization of prox R MCA, now on mechanical ventilation post-procedure. Extubate 12/07.  She is currently awaiting neurology recommendation on anticoagulation for afib and rehab placement.  #. Right MCA Stroke  --> s/p tPA, anteriogram with revascularization  --> Left UE weakness remains  --> Dysphagia on swallow evaluation - ASA, Lipitor - Puree diet, consider alternative if patient is not awake/alert enough to have adequate diet - MRI without hemorrhagic, no anticoagulation at this time due to >50% involvement per neuro - Currently on Lovenox low dose - Rehab consult per neurology  #. New Onset Atrial Fibrillation  --> Was rhythm controlled with Amiodarone for 2 days or so  --> Afib without rate control last night  --> Currently on both Amiodarone drip and Cardizem drip   --> BP remains borderline low - s/p tPA and currently on DVT prophylaxis dose of Lovenox - CHA2DS2 VASc score 6, will need long term anticoagulation, for MRI - On ASA and Lovenox - LFTs is trending down with Amiodarone use - Will transition to PO rate control today per neurology request  #. Acute Anemia, dilution likely  --> Likely due to blood loss and dilution  --> Admission hgb 12.2 now at 10.8, improving -  AM CBC - Transfuse if level drop below 7gm/dL  #. Interstial Edema/PAH  --> Secondary to AFIB w/ RVR and positive balance  --> ECHO with decreased EF at 30-35% - Will increase Lasix to 40mg  tid  - PCXR in am   #. Mild LFTs Elevation  --> Likely no related to Amiodarone  --> Repeat LFTs with level trending down - Continue to monitor  # Mild Mixed Acidosis - Resolved - Monitor  #. Hypokalemia  --> Potassium  level at 3.1 - Potassium replace by [facility]    D/c from ICU once rate ok on amio drip and now addition cardizem, pureed diet and advance as tolerate, consider inpatient rehab, neurology to decide anticoagulation with MRI result yesterday.   Mcarthur Rossetti. Tyson Alias, MD, FACP Pgr: [phone] [facility]

## 2013-09-28 NOTE — Progress Notes (Signed)
Physical Therapy Treatment Patient Details Name: Dorothy [name] MRN: [id number] DOB: [birth year] Today's Date: 09/28/2013 Time: 4098-1191 PT Time Calculation (min): 30 min  PT Assessment / Plan / Recommendation  History of Present Illness Pt found to have R MCA infarct   PT Comments   Patient found to be more lethargic today.  Max stimulation for arousal.  Patient was able to work in sitting on head control, trunk control/balance, activity tolerance, and LE exercises.  Slight improvement in sitting balance today, however more lethargic.  RN to room.  Follow Up Recommendations  CIR     Does the patient have the potential to tolerate intense rehabilitation     Barriers to Discharge        Equipment Recommendations  Other (comment) (TBD)    Recommendations for Other Services Rehab consult  Frequency Min 4X/week   Progress towards PT Goals Progress towards PT goals: Progressing toward goals  Plan Current plan remains appropriate    Precautions / Restrictions Precautions Precautions: Fall Precaution Comments: L inattention Restrictions Weight Bearing Restrictions: No   Pertinent Vitals/Pain     Mobility  Bed Mobility Bed Mobility: Rolling Right;Rolling Left;Supine to Sit;Sitting - Scoot to [place] of Bed;Sit to Supine Rolling Right: 3: Mod assist Rolling Left: 3: Mod assist Supine to Sit: 1: +2 Total assist;HOB elevated Supine to Sit: Patient Percentage: 20% Sitting - Scoot to Edge of Bed: 1: +2 Total assist Sitting - Scoot to Edge of Bed: Patient Percentage: 30% Sit to Supine: 1: +2 Total assist;HOB flat Sit to Supine: Patient Percentage: 10% Details for Bed Mobility Assistance: Verbal and tactile cues for technique.  Assist to raise trunk to sitting.  Worked on sitting balance x 10 minutes. Transfers Transfers: Not assessed (Due to lethargy) Modified Rankin (Stroke Patients Only) Pre-Morbid Rankin Score: No symptoms Modified Rankin: Severe disability    Exercises  General Exercises - Lower Extremity Ankle Circles/Pumps: AROM;Both;10 reps;Seated Long Arc Quad: AROM;Both;10 reps;Seated     PT Goals (current goals can now be found in the care plan section)    Visit Information  Last PT Received On: 09/28/13 Assistance Needed: +2 History of Present Illness: Pt found to have R MCA infarct    Subjective Data  Subjective: Patient very lethargic today.  Min verbalizations   Cognition  Cognition Arousal/Alertness: Lethargic Behavior During Therapy: Flat affect Overall Cognitive Status: Impaired/Different from baseline General Comments: Initially unable to arouse patient.  RN called to room.  Patient had also been incontinent of bowel.  After several minutes of sternal rubs, calling patient's name loudly, patient became more alert.    Balance  Balance Balance Assessed: Yes Static Sitting Balance Static Sitting - Balance Support: Right upper extremity supported;Feet supported Static Sitting - Level of Assistance: 3: Mod assist Static Sitting - Comment/# of Minutes: 10 minutes.  Patient could maintain sitting balance for 5 seconds with min guard assist.  Otherwise required mod assist for balance.  Worked with patient on holding head upright - tended to keep head down.  Worked on patient tracking objects to left side.  Also had patient perform LE exercises - required mod assist for trunk stability during these activities.  Patient able to hold head upright approx 25 seconds.  End of Session PT - End of Session Activity Tolerance: Patient limited by lethargy Patient left: in bed;with call bell/phone within reach Nurse Communication: Mobility status (Change in mental status)   GP     Vena Austria 09/28/2013, 4:39 PM Durenda Hurt. Earlene Plater, PT,  [place] Pager [phone]

## 2013-09-28 NOTE — Progress Notes (Signed)
NUTRITION FOLLOW UP  Intervention:    1. Magic cup TID between meals, each supplement provides 290 kcal and 9 grams of protein.  Nutrition Dx:   Malnutrition related to chronic illness as evidenced by severe fat and muscle wasting; ongoing.   Goal:  Pt to meet >/= 90% of their estimated nutrition needs, not met.   Monitor:  Diet advancement, PO intake, weight trend, labs  Assessment:   Pt with R MCA stroke s/p tPA and R common carotid arteriogram with revascularization of prox R MCA. Pt extubated 12/7.  Pt started diet yesterday afternoon. Per RN pt had a magic cup for Breakfast along with a couple of bites of oatmeal.  Encouraged pt to consume magic cups.   Height: Ht Readings from Last 1 Encounters:  09/24/13 5\' 5"  (1.651 m)    Weight Status:   Wt Readings from Last 1 Encounters:  09/25/13 108 lb 0.4 oz (49 kg)  Admission weight 108 lb (49 kg)  Re-estimated needs:  Kcal: 1450-1600  Protein: 70-80 grams  Fluid: > 1.5 L/day  Skin: right ankle wound  Diet Order: Dysphagia 1 with Pudding Thick Liquids Meal Completion: 10%    Intake/Output Summary (Last 24 hours) at 09/28/13 1005 Last data filed at 09/28/13 0900  Gross per 24 hour  Intake 1449.67 ml  Output   2335 ml  Net -885.33 ml    Last BM: PTA   Labs:   Recent Labs Lab 09/26/13 0530 09/26/13 1325 09/27/13 1045 09/28/13 0345  NA 141 141 144 142  K 3.6 4.3 4.1 3.1*  CL 106 105 106 106  CO2 20 15* 17* 23  BUN 20 21 23 22   CREATININE 0.90 0.87 0.81 0.81  CALCIUM 8.7 8.9 8.9 8.7  MG  --  2.0  --   --   PHOS  --  2.6  --   --   GLUCOSE 99 106* 155* 186*    CBG (last 3)   Recent Labs  09/27/13 2007 09/27/13 2350 09/28/13 0915  GLUCAP 177* 164* 138*   Lab Results  Component Value Date   HGBA1C 6.1* 09/25/2013   Scheduled Meds: . aspirin  300 mg Rectal Daily  . atorvastatin  20 mg Oral q1800  . chlorhexidine  15 mL Mouth/Throat BID  . diltiazem  30 mg Oral Q8H  . enoxaparin (LOVENOX)  injection  30 mg Subcutaneous Q24H  . furosemide  40 mg Intravenous TID  . influenza vac split quadrivalent PF  0.5 mL Intramuscular Tomorrow-1000  . pantoprazole (PROTONIX) IV  40 mg Intravenous QHS  . pneumococcal 23 valent vaccine  0.5 mL Intramuscular Tomorrow-1000  . potassium chloride  10 mEq Intravenous Q1 Hr x 4    Continuous Infusions: . sodium chloride 45 mL/hr at 09/28/13 0900  . amiodarone (NEXTERONE PREMIX) 360 mg/200 mL dextrose 30 mg/hr (09/28/13 0203)    Kendell Bane RD, LDN, CNSC [phone] Pager [phone] After Hours Pager

## 2013-09-28 NOTE — Progress Notes (Signed)
[place] 12pm CBG 230, Annie Main, NP notified.  No orders given.  Harlan Stains, RN

## 2013-09-28 NOTE — Progress Notes (Signed)
Occupational Therapy Treatment Patient Details Name: Loan Oguin MRN: [id number] DOB: 1930-11-02 Today's Date: 09/28/2013 Time: 4098-1191 OT Time Calculation (min): 25 min  OT Assessment / Plan / Recommendation  History of present illness Pt found to have R MCA infarct   OT comments  Pt initially difficult to arouse. Nsg aware. Pt with increased deficits with L inattention/apparent field cut. Decreased strength LUE as compared to yesterday and decreased postural control Pt more lethargic today and less verbal. Nsg aware of functional change. Do not feel pt would be able to tolerate CIR at this time. Feel SNF will be more appropriate D/C plan.  Follow Up Recommendations  SNF    Barriers to Discharge   ?caregiver support    Equipment Recommendations       Recommendations for Other Services    Frequency Min 2X/week   Progress towards OT Goals Progress towards OT goals: Not progressing toward goals - comment (more lethargic this pm)  Plan Discharge plan needs to be updated    Precautions / Restrictions Precautions Precautions: Fall Precaution Comments: L inattention Restrictions Weight Bearing Restrictions: No   Pertinent Vitals/Pain HR 97-129 intermittent A fib RR 24-37 O2 4L 97%    ADL  Grooming: Maximal assistance Where Assessed - Grooming: Supported sitting ADL Comments: Pt more lethargic today.     OT Diagnosis:    OT Problem List:   OT Treatment Interventions:     OT Goals(current goals can now be found in the care plan section) Acute Rehab OT Goals Patient Stated Goal: to do more for myself OT Goal Formulation: With patient Time For Goal Achievement: 10/11/13 Potential to Achieve Goals: Good ADL Goals Pt Will Perform Eating: with min assist;sitting Pt Will Perform Grooming: with min assist;sitting Pt Will Perform Upper Body Bathing: with min assist;sitting Pt Will Transfer to Toilet: with min assist;bedside commode;stand pivot transfer Additional ADL  Goal #1: Locate 3/3 objects for ADL task in L visual field with min vc only Additional ADL Goal #2: Maintain upright midline posture sitting EOB with S  Visit Information  Last OT Received On: 09/28/13 Assistance Needed: +2 History of Present Illness: Pt found to have R MCA infarct    Subjective Data      Prior Functioning       Cognition  Cognition Arousal/Alertness: Lethargic Behavior During Therapy: Flat affect Overall Cognitive Status: Impaired/Different from baseline Area of Impairment: Orientation;Attention;Memory;Following commands;Safety/judgement;Awareness;Problem solving Orientation Level: Disoriented to;Place;Time Current Attention Level: Sustained Memory: Decreased recall of precautions;Decreased short-term memory Following Commands: Follows one step commands consistently Safety/Judgement: Decreased awareness of safety;Decreased awareness of deficits Awareness: Intellectual Problem Solving: Slow processing;Decreased initiation;Difficulty sequencing;Requires verbal cues;Requires tactile cues General Comments: difficulty arousing pt initially. Nsg in room.     Mobility  Bed Mobility Bed Mobility: Rolling Right;Rolling Left;Supine to Sit;Sitting - Scoot to [place] of Bed;Sit to Supine Rolling Right: 3: Mod assist Rolling Left: 3: Mod assist Supine to Sit: 2: Max assist Supine to Sit: Patient Percentage: 20% Sitting - Scoot to Edge of Bed: 1: +2 Total assist Sitting - Scoot to Edge of Bed: Patient Percentage: 30% Sit to Supine: 1: +2 Total assist;HOB flat Sit to Supine: Patient Percentage: 10% Details for Bed Mobility Assistance: Verbal and tactile cues for technique.  Assist to raise trunk to sitting.  Worked on sitting balance x 10 minutes. Transfers Details for Transfer Assistance: did not get pt OOB this pm per nsg requeat    Exercises  General Exercises - Lower Extremity Ankle Circles/Pumps: AROM;Both;10 reps;Seated  Long Arc Quad: AROM;Both;10 reps;Seated    Balance Balance Balance Assessed: Yes Static Sitting Balance Static Sitting - Balance Support: Right upper extremity supported;Feet supported (lean R) Static Sitting - Level of Assistance: 3: Mod assist Static Sitting - Comment/# of Minutes: 15   End of Session OT - End of Session Activity Tolerance: Patient limited by lethargy Patient left: in bed;with call bell/phone within reach;with nursing/sitter in room Nurse Communication: Mobility status;Other (comment) (concerns regarding lethargy)  GO     Nohelia Valenza,HILLARY 09/28/2013, 4:50 PM [facility], OTR/L  [phone] 09/28/2013

## 2013-09-28 NOTE — Progress Notes (Signed)
Stroke Team Progress Note  HISTORY 79 y.o. female with a past medical history significant for atrial fibrillation, brought to [facility] ED by ambulance as a code stroke. She was last known well at 9 am today 09/24/2013, talking to her husband and suddenly became less responsive, fell to the floor, and was EMS arrived shew was not able to move the left side and had dysarthria. Never had similar symptoms before. Initial NIHSS 19.  CT brain revealed questionable ill defined area of hypodensity left MCA territory as well as question minimal hyperdensity of the right carotid terminus/M1 segment right middle cerebral artery raises the possibility of thrombus at this level. She was a candidate for IV TPA and it was administered. She is alert and awake, following commands, and started improving in then ED after receiving the IV tpa bolus.  Given hyperdense right MCA and NIH stroke scale of 19, concern for large vessel embolus. Patient was taken by interventional neuroradiology for a cerebral angiogram which showed occlusion of the right MCA proximally. She was administered intra-arterial TPA he 10.8 mg and 2 passes of the solitaire retrieval device for a TICI3 revascularization. She was intubated for airway protection during the procedure. She was admitted to the neuro ICU for further evaluation and treatment.  SUBJECTIVE No family at bedside. Still on amiodarone and cardizem as well.  OBJECTIVE Most recent Vital Signs: Filed Vitals:   09/28/13 0400 09/28/13 0500 09/28/13 0600 09/28/13 0700  BP: 118/69 119/72 104/69 116/60  Pulse: 98 105 109 79  Temp: 98.6 F (37 C) 98.8 F (37.1 C) 98.8 F (37.1 C) 98.8 F (37.1 C)  TempSrc:      Resp: 28 29 31 27   Height:      Weight:      SpO2: 95% 95% 97% 98%   CBG (last 3)   Recent Labs  09/27/13 1548 09/27/13 2007 09/27/13 2350  GLUCAP 191* 177* 164*    IV Fluid Intake:   . sodium chloride 45 mL/hr (09/27/13 2318)  . amiodarone (NEXTERONE PREMIX) 360  mg/200 mL dextrose 30 mg/hr (09/28/13 0203)  . diltiazem (CARDIZEM) infusion 5 mg/hr (09/27/13 2000)    MEDICATIONS  . aspirin  300 mg Rectal Daily  . atorvastatin  20 mg Oral q1800  . chlorhexidine  15 mL Mouth/Throat BID  . diltiazem  5 mg Intravenous Once  . enoxaparin (LOVENOX) injection  30 mg Subcutaneous Q24H  . furosemide  20 mg Intravenous Q12H  . influenza vac split quadrivalent PF  0.5 mL Intramuscular Tomorrow-1000  . pantoprazole (PROTONIX) IV  40 mg Intravenous QHS  . pneumococcal 23 valent vaccine  0.5 mL Intramuscular Tomorrow-1000  . potassium chloride  30 mEq Oral Once   PRN:  fentaNYL, labetalol, RESOURCE THICKENUP CLEAR, senna-docusate  Diet:  Dysphagia 1 pudding thick Activity:   Up in chair DVT Prophylaxis:  Lovenox 30 mg sq daily, SCDs  CLINICALLY SIGNIFICANT STUDIES Basic Metabolic Panel:   Recent Labs Lab 09/26/13 0530 09/26/13 1325 09/27/13 1045 09/28/13 0345  NA 141 141 144 142  K 3.6 4.3 4.1 3.1*  CL 106 105 106 106  CO2 20 15* 17* 23  GLUCOSE 99 106* 155* 186*  BUN 20 21 23 22   CREATININE 0.90 0.87 0.81 0.81  CALCIUM 8.7 8.9 8.9 8.7  MG  --  2.0  --   --   PHOS  --  2.6  --   --    Liver Function Tests:   Recent Labs Lab 09/26/13 1325 09/27/13  1045  AST 109* 72*  ALT 94* 78*  ALKPHOS 90 89  BILITOT 0.5 0.4  PROT 5.8* 6.2  ALBUMIN 2.4* 2.6*   CBC:   Recent Labs Lab 09/25/13 0520 09/26/13 0530 09/28/13 0345  WBC 11.0* 11.2* 11.6*  NEUTROABS 9.3*  --  9.3*  HGB 9.0* 9.8* 10.8*  HCT 26.6* 30.1* 33.3*  MCV 92.4 93.5 95.4  PLT 275 339 356   Coagulation:   Recent Labs Lab 09/24/13 1105  LABPROT 14.3  INR 1.13   Cardiac Enzymes:   Recent Labs Lab 09/24/13 1105  TROPONINI <0.30   Urinalysis:   Recent Labs Lab 09/24/13 1143  COLORURINE YELLOW  LABSPEC 1.020  PHURINE 5.5  GLUCOSEU NEGATIVE  HGBUR NEGATIVE  BILIRUBINUR SMALL*  KETONESUR 15*  PROTEINUR NEGATIVE  UROBILINOGEN 1.0  NITRITE NEGATIVE   LEUKOCYTESUR NEGATIVE   Lipid Panel    Component Value Date/Time   CHOL 109 09/25/2013 0520   TRIG 78 09/25/2013 0520   HDL 26* 09/25/2013 0520   CHOLHDL 4.2 09/25/2013 0520   VLDL 16 09/25/2013 0520   LDLCALC 67 09/25/2013 0520   HgbA1C  Lab Results  Component Value Date   HGBA1C 6.1* 09/25/2013    Urine Drug Screen:     Component Value Date/Time   LABOPIA NONE DETECTED 09/24/2013 1143   COCAINSCRNUR NONE DETECTED 09/24/2013 1143   LABBENZ NONE DETECTED 09/24/2013 1143   AMPHETMU NONE DETECTED 09/24/2013 1143   THCU NONE DETECTED 09/24/2013 1143   LABBARB NONE DETECTED 09/24/2013 1143    Alcohol Level:   Recent Labs Lab 09/24/13 1105  ETH <11    CT of the brain  09/25/2013    No intracranial hemorrhage.  Right middle cerebral artery distribution infarct is better visualize with indistinctness of the gray white junction of the right frontal operculum region and indistinctness of the right lenticular nucleus/right caudate head.    09/24/2013    Mild evolution of a right middle cerebral artery distribution acute infarct. No intracranial hemorrhage.   09/24/2013    Indistinct appearance of the right basal ganglia, right caudate, right subinsular region, right peri operculum region and portion of the right frontal lobe suggestive of a right middle cerebral artery acute infarct involving over 1/3 of the distribution of the right middle cerebral artery.  Question minimal hyperdensity of the right carotid terminus/M1 segment right middle cerebral artery raises the possibility of thrombus at this level.  No intracranial hemorrhage currently noted.    Cerebral Angio 09/24/2013 S/P rt common carotid arteriogram followed by TICI 3 Revascularization of occluded RT MCA prox using 10.8 mg of IA tpa and 2 passes with the 4 x 40 mm Solitaire FR retrieval device.   MRI of the brain  09/27/2013 Large acute/subacute right middle cerebral artery distribution nonhemorrhagic infarct with mild local mass effect  without midline shift.   MRA of the brain  See angio  2D Echocardiogram  EF 30-35% with no source of embolus.   Carotid Doppler  No evidence of hemodynamically significant internal carotid artery stenosis. Vertebral artery flow is antegrade.   CXR   09/28/2013 Worsening pulmonary edema. 09/26/2013    Persistent pulmonary edema and bibasilar effusions.  Increased atelectasis versus consolidation left lower lobe.  09/24/2013    1. Status post intubation.  Tip of tube is of above the carina. 2. Moderate pulmonary edema.   EKG  normal sinus rhythm, LBBB, PAC's noted.   Therapy Recommendations CIR  Physical Exam   Mental Status:  Sedated  on popofol  Cranial Nerves:  II: Discs flat bilaterally; left visual filed defect, pupils equal, round, reactive to light and accommodation  III,IV, VI: ptosis not present, right gaze preference.  V,VII: smile asymmetric with left face weakness, facial light touch sensation normal bilaterally  VIII: hearing normal bilaterally  IX,X: gag reflex present  XI: bilateral shoulder shrug  XII: midline tongue extension without atrophy or fasciculations  Motor:  Left sided hemiplegia  Tone and bulk:normal tone throughout; no atrophy noted  Sensory: Pinprick and light touch intact throughout, bilaterally  Deep Tendon Reflexes:  Right: Upper Extremity Left: Upper extremity  biceps (C-5 to C-6) 2/4 biceps (C-5 to C-6) 2/4  tricep (C7) 2/4 triceps (C7) 2/4  Brachioradialis (C6) 2/4 Brachioradialis (C6) 2/4  Lower Extremity Lower Extremity  quadriceps (L-2 to L-4) 2/4 quadriceps (L-2 to L-4) 2/4  Achilles (S1) 2/4 Achilles (S1) 2/4  Plantars:  Right: downgoing Left: upgoing  Cerebellar:  normal finger-to-nose, normal heel-to-shin test  Gait:  No tested.    ASSESSMENT Dorothy [name] is a 79 y.o. female presenting with left hemiparesis and dysarthria. Status post IV t-PA 1147 at 09/24/2013 followed by revascularization of occluded proximal R  MCA prox  using IA tpa and Solitaire retrieval device. Imaging confirms a right MCA infarct. Infarct felt to be  embolic secondary to atrial fibrillation.  On aspirin 81 mg orally every day prior to admission. Now on aspirin 325 mg orally every day for secondary stroke prevention. Patient with resultant left hemiparesis, dysphagia. Work up underway.  Intubation for neurointervention only, extubated.  Pulmonary edema per CXR this am atrial fibrillation, unknown if new or old, not on anticoagulation prior to admission, maximized IV diltiazem for rate control. Amiodarone added w/ QRS widening, now back in NSR  CHA2DS2-VASc Score for Atrial Fibrillation Stroke Risk = 7   Age in Years:   ?10 +2    Sex:   Female +1    Congestive Heart Failure History:  yes +1   Hypertension History:  Yes +1   Stroke/TIA/Thromboembolism History:  yes +2   Vascular Disease History:  no   Diabetes Mellitus: no Hypertension, on diazide prior to admission Elevated liver enzymes, mild Anemia, acute blood loss post neurointervention and dehydration 12.2->9.8 Thyroid disease, on synthroid PTA Dehydration, Cr 1.03->0.87 Hgb 9.8 Left arm edematous  Hospital day # 4  TREATMENT/PLAN  Continue ICU level care given amiodarone x 24 more hours and cardizem drip - CCM managing   Continue aspirin 325 mg orally every day x 1 week for secondary stroke prevention due to large size of stroke; start anticoagulation day #8.  Rehab consult  Keep left arm elevated  SHARON BIBY, MSN, RN, ANVP-BC, ANP-BC, GNP-BC [facility] Pager: [phone] 09/28/2013 8:13 AM This patient is critically ill and at significant risk of neurological worsening, death and care requires constant monitoring of vital signs, hemodynamics,respiratory and cardiac monitoring,review of multiple databases, neurological assessment, discussion with family, other specialists and medical decision making of high complexity. I spent 30 minutes of neurocritical  care time  in the care of  this patient. I have personally obtained a history, examined the patient, evaluated imaging results, and formulated the assessment and plan of care. I agree with the above.  Delia Heady, MD

## 2013-09-29 ENCOUNTER — Encounter (HOSPITAL_COMMUNITY): Payer: Self-pay | Admitting: *Deleted

## 2013-09-29 ENCOUNTER — Inpatient Hospital Stay (HOSPITAL_COMMUNITY): Payer: Medicare Other

## 2013-09-29 LAB — BASIC METABOLIC PANEL
BUN: 22 mg/dL (ref 6–23)
CO2: 29 mEq/L (ref 19–32)
Calcium: 7.7 mg/dL — ABNORMAL LOW (ref 8.4–10.5)
Calcium: 8.3 mg/dL — ABNORMAL LOW (ref 8.4–10.5)
Chloride: 103 mEq/L (ref 96–112)
Creatinine, Ser: 0.84 mg/dL (ref 0.50–1.10)
Creatinine, Ser: 0.82 mg/dL (ref 0.50–1.10)
GFR calc Af Amer: 74 mL/min — ABNORMAL LOW (ref >90)
GFR calc Af Amer: 76 mL/min — ABNORMAL LOW (ref >90)
GFR calc Af Amer: 66 mL/min — ABNORMAL LOW (ref >90)
GFR calc non Af Amer: 64 mL/min — ABNORMAL LOW (ref >90)
GFR calc non Af Amer: 66 mL/min — ABNORMAL LOW (ref >90)
GFR calc non Af Amer: 57 mL/min — ABNORMAL LOW (ref >90)
Glucose, Bld: 159 mg/dL — ABNORMAL HIGH (ref 70–99)
Sodium: 144 mEq/L (ref 135–145)

## 2013-09-29 LAB — GLUCOSE, CAPILLARY
Glucose-Capillary: 134 mg/dL — ABNORMAL HIGH (ref 70–99)
Glucose-Capillary: 124 mg/dL — ABNORMAL HIGH (ref 70–99)
Glucose-Capillary: 136 mg/dL — ABNORMAL HIGH (ref 70–99)
Glucose-Capillary: 123 mg/dL — ABNORMAL HIGH (ref 70–99)

## 2013-09-29 MED ORDER — FUROSEMIDE 10 MG/ML IJ SOLN
40.0000 mg | Freq: Four times a day (QID) | INTRAMUSCULAR | Status: AC
  Administered 2013-09-29 (×2): 40 mg via INTRAVENOUS
  Filled 2013-09-29 (×2): qty 4

## 2013-09-29 MED ORDER — AMIODARONE HCL 200 MG PO TABS
200.0000 mg | ORAL_TABLET | Freq: Two times a day (BID) | ORAL | Status: DC
  Administered 2013-09-29 – 2013-10-03 (×10): 200 mg via ORAL
  Filled 2013-09-29 (×15): qty 1

## 2013-09-29 MED ORDER — ASPIRIN 81 MG PO CHEW
324.0000 mg | CHEWABLE_TABLET | Freq: Every day | ORAL | Status: DC
  Administered 2013-09-30 – 2013-10-01 (×2): 324 mg via ORAL
  Filled 2013-09-29 (×2): qty 4

## 2013-09-29 MED ORDER — SODIUM CHLORIDE 0.9 % IJ SOLN
10.0000 mL | INTRAMUSCULAR | Status: DC | PRN
  Administered 2013-09-30: 10 mL

## 2013-09-29 MED ORDER — DILTIAZEM HCL 30 MG PO TABS
30.0000 mg | ORAL_TABLET | Freq: Two times a day (BID) | ORAL | Status: DC
  Administered 2013-09-29 – 2013-10-03 (×8): 30 mg via ORAL
  Filled 2013-09-29 (×9): qty 1

## 2013-09-29 MED ORDER — FAMOTIDINE 20 MG PO TABS
20.0000 mg | ORAL_TABLET | Freq: Every day | ORAL | Status: DC
  Administered 2013-09-29 – 2013-10-04 (×6): 20 mg via ORAL
  Filled 2013-09-29 (×6): qty 1

## 2013-09-29 MED ORDER — POTASSIUM CHLORIDE 10 MEQ/100ML IV SOLN
10.0000 meq | INTRAVENOUS | Status: AC
Start: 2013-09-29 — End: 2013-09-29
  Administered 2013-09-29 (×7): 10 meq via INTRAVENOUS
  Filled 2013-09-29 (×14): qty 100

## 2013-09-29 MED ORDER — SODIUM CHLORIDE 0.9 % IJ SOLN
10.0000 mL | Freq: Two times a day (BID) | INTRAMUSCULAR | Status: DC
  Administered 2013-09-30: 10 mL
  Administered 2013-10-01: 20 mL
  Administered 2013-10-02 (×2): 10 mL
  Administered 2013-10-03 (×2): 20 mL

## 2013-09-29 NOTE — Progress Notes (Signed)
Peripherally Inserted Central Catheter/Midline Placement  The IV Nurse has discussed with the patient and/or persons authorized to consent for the patient, the purpose of this procedure and the potential benefits and risks involved with this procedure.  The benefits include less needle sticks, lab draws from the catheter and patient may be discharged home with the catheter.  Risks include, but not limited to, infection, bleeding, blood clot (thrombus formation), and puncture of an artery; nerve damage and irregular heat beat.  Alternatives to this procedure were also discussed.  PICC/Midline Placement Documentation        Dorothy [name] 09/29/2013, 11:49 AM

## 2013-09-29 NOTE — Progress Notes (Signed)
[facility] ADULT ICU REPLACEMENT PROTOCOL FOR AM LAB REPLACEMENT ONLY  The patient does apply for the [facility] Adult ICU Electrolyte Replacment Protocol based on the criteria listed below:   1. Is GFR >/= 40 ml/min? yes  Patient's GFR today is 64 2. Is urine output >/= 0.5 ml/kg/hr for the last 6 hours? yes Patient's UOP is 1.39 ml/kg/hr 3. Is BUN < 60 mg/dL? yes  Patient's BUN today is 21 4. Abnormal electrolyte(s):K 2.4 5. Ordered repletion with: per protocol 6. If a panic level lab has been reported, has the CCM MD in charge been notified? yes.   Physician:  Rollene Rotunda 09/29/2013 4:48 AM

## 2013-09-29 NOTE — Progress Notes (Signed)
Stroke Team Progress Note  HISTORY 79 y.o. female with a past medical history significant for atrial fibrillation, brought to [facility] ED by ambulance as a code stroke. She was last known well at 9 am today 09/24/2013, talking to her husband and suddenly became less responsive, fell to the floor, and was EMS arrived shew was not able to move the left side and had dysarthria. Never had similar symptoms before. Initial NIHSS 19.  CT brain revealed questionable ill defined area of hypodensity left MCA territory as well as question minimal hyperdensity of the right carotid terminus/M1 segment right middle cerebral artery raises the possibility of thrombus at this level. She was a candidate for IV TPA and it was administered. She is alert and awake, following commands, and started improving in then ED after receiving the IV tpa bolus.  Given hyperdense right MCA and NIH stroke scale of 19, concern for large vessel embolus. Patient was taken by interventional neuroradiology for a cerebral angiogram which showed occlusion of the right MCA proximally. She was administered intra-arterial TPA he 10.8 mg and 2 passes of the solitaire retrieval device for a TICI3 revascularization. She was intubated for airway protection during the procedure. She was admitted to the neuro ICU for further evaluation and treatment.  SUBJECTIVE No family at bedside. Had 2 spells of being non-responsive yesterday - CT stable, EEG ordered. Ex husband (whom she lives with) and nephew visited yesterday. RN reports pt told her, "I wish they would let me die".  OBJECTIVE Most recent Vital Signs: Filed Vitals:   09/29/13 0500 09/29/13 0600 09/29/13 0700 09/29/13 0800  BP: 108/58 109/55 108/53 104/47  Pulse: 73 71 68 64  Temp: 99.1 F (37.3 C) 99.1 F (37.3 C) 98.8 F (37.1 C) 98.6 F (37 C)  TempSrc:    Core (Comment)  Resp: 22 21 19 16   Height:      Weight:      SpO2: 100% 100% 100% 100%   CBG (last 3)   Recent Labs  09/28/13 1959  09/29/13 0305 09/29/13 0754  GLUCAP 184* 134* 124*    IV Fluid Intake:   . sodium chloride 45 mL/hr at 09/29/13 0800    MEDICATIONS  . amiodarone  200 mg Oral BID  . aspirin  300 mg Rectal Daily  . atorvastatin  20 mg Oral q1800  . chlorhexidine  15 mL Mouth/Throat BID  . diltiazem  30 mg Oral Q12H  . enoxaparin (LOVENOX) injection  30 mg Subcutaneous Q24H  . furosemide  40 mg Intravenous Q6H  . influenza vac split quadrivalent PF  0.5 mL Intramuscular Tomorrow-1000  . pantoprazole sodium  40 mg Per Tube Daily  . pneumococcal 23 valent vaccine  0.5 mL Intramuscular Tomorrow-1000  . potassium chloride  10 mEq Intravenous Q1H   PRN:  fentaNYL, labetalol, RESOURCE THICKENUP CLEAR, senna-docusate  Diet:  Dysphagia 1 pudding thick Activity:   Up in chair DVT Prophylaxis:  Lovenox 30 mg sq daily, SCDs  CLINICALLY SIGNIFICANT STUDIES Basic Metabolic Panel:   Recent Labs Lab 09/26/13 0530 09/26/13 1325  09/28/13 0345 09/29/13 0338  NA 141 141  < > 142 144  K 3.6 4.3  < > 3.1* 2.4*  CL 106 105  < > 106 103  CO2 20 15*  < > 23 29  GLUCOSE 99 106*  < > 186* 128*  BUN 20 21  < > 22 21  CREATININE 0.90 0.87  < > 0.81 0.84  CALCIUM 8.7 8.9  < >  8.7 8.5  MG  --  2.0  --   --   --   PHOS  --  2.6  --   --   --   < > = values in this interval not displayed. Liver Function Tests:   Recent Labs Lab 09/26/13 1325 09/27/13 1045  AST 109* 72*  ALT 94* 78*  ALKPHOS 90 89  BILITOT 0.5 0.4  PROT 5.8* 6.2  ALBUMIN 2.4* 2.6*   CBC:   Recent Labs Lab 09/25/13 0520 09/26/13 0530 09/28/13 0345  WBC 11.0* 11.2* 11.6*  NEUTROABS 9.3*  --  9.3*  HGB 9.0* 9.8* 10.8*  HCT 26.6* 30.1* 33.3*  MCV 92.4 93.5 95.4  PLT 275 339 356   Coagulation:   Recent Labs Lab 09/24/13 1105  LABPROT 14.3  INR 1.13   Cardiac Enzymes:   Recent Labs Lab 09/24/13 1105  TROPONINI <0.30   Urinalysis:   Recent Labs Lab 09/24/13 1143  COLORURINE YELLOW  LABSPEC 1.020  PHURINE 5.5   GLUCOSEU NEGATIVE  HGBUR NEGATIVE  BILIRUBINUR SMALL*  KETONESUR 15*  PROTEINUR NEGATIVE  UROBILINOGEN 1.0  NITRITE NEGATIVE  LEUKOCYTESUR NEGATIVE   Lipid Panel    Component Value Date/Time   CHOL 109 09/25/2013 0520   TRIG 78 09/25/2013 0520   HDL 26* 09/25/2013 0520   CHOLHDL 4.2 09/25/2013 0520   VLDL 16 09/25/2013 0520   LDLCALC 67 09/25/2013 0520   HgbA1C  Lab Results  Component Value Date   HGBA1C 6.1* 09/25/2013    Urine Drug Screen:     Component Value Date/Time   LABOPIA NONE DETECTED 09/24/2013 1143   COCAINSCRNUR NONE DETECTED 09/24/2013 1143   LABBENZ NONE DETECTED 09/24/2013 1143   AMPHETMU NONE DETECTED 09/24/2013 1143   THCU NONE DETECTED 09/24/2013 1143   LABBARB NONE DETECTED 09/24/2013 1143    Alcohol Level:   Recent Labs Lab 09/24/13 1105  ETH <11    CT of the brain  09/28/2013 The distribution of territory involved with the middle cerebral artery infarct on the right is essentially stable compared to 1 day prior. There is localized cytotoxic edema throughout much of the area of infarct with relative effacement of sulci in these areas. Note that there is edema of the head of the caudate nucleus on the right, a finding also noted 1 day prior. Most likely, the recurrent artery of [place] on the right which normally supplies the head of the caudate nucleus arises from the middle cerebral artery in this case, an anatomic variant. No acute hemorrhage seen. No new infarct appreciated compared to 1 day prior. 09/25/2013    No intracranial hemorrhage.  Right middle cerebral artery distribution infarct is better visualize with indistinctness of the gray white junction of the right frontal operculum region and indistinctness of the right lenticular nucleus/right caudate head.    09/24/2013    Mild evolution of a right middle cerebral artery distribution acute infarct. No intracranial hemorrhage.   09/24/2013    Indistinct appearance of the right basal ganglia, right caudate,  right subinsular region, right peri operculum region and portion of the right frontal lobe suggestive of a right middle cerebral artery acute infarct involving over 1/3 of the distribution of the right middle cerebral artery.  Question minimal hyperdensity of the right carotid terminus/M1 segment right middle cerebral artery raises the possibility of thrombus at this level.  No intracranial hemorrhage currently noted.    Cerebral Angio 09/24/2013 S/P rt common carotid arteriogram followed by TICI 3  Revascularization of occluded RT MCA prox using 10.8 mg of IA tpa and 2 passes with the 4 x 40 mm Solitaire FR retrieval device.   MRI of the brain  09/27/2013 Large acute/subacute right middle cerebral artery distribution nonhemorrhagic infarct with mild local mass effect without midline shift.   MRA of the brain  See angio  2D Echocardiogram  EF 30-35% with no source of embolus.   Carotid Doppler  No evidence of hemodynamically significant internal carotid artery stenosis. Vertebral artery flow is antegrade.   CXR   09/29/2013 09/28/2013 Worsening pulmonary edema. 09/26/2013    Persistent pulmonary edema and bibasilar effusions.  Increased atelectasis versus consolidation left lower lobe.  09/24/2013    1. Status post intubation.  Tip of tube is of above the carina. 2. Moderate pulmonary edema.   EKG  normal sinus rhythm, LBBB, PAC's noted.   Therapy Recommendations CIR, SNF  Physical Exam   Mental Status:   Awake and interactive. Dysarthric speech. Can be understood Cranial Nerves:  II: Discs flat bilaterally; left visual filed defect, pupils equal, round, reactive to light and accommodation  III,IV, VI: ptosis not present, right gaze preference.  V,VII: smile asymmetric with left face weakness, facial light touch sensation normal bilaterally  VIII: hearing normal bilaterally  IX,X: gag reflex present  XI: bilateral shoulder shrug  XII: midline tongue extension without atrophy or  fasciculations  Motor:  Left sided hemiplegia  Tone and bulk:normal tone throughout; no atrophy noted  Sensory: Pinprick and light touch intact throughout, bilaterally  Deep Tendon Reflexes:  Right: Upper Extremity Left: Upper extremity  biceps (C-5 to C-6) 2/4 biceps (C-5 to C-6) 2/4  tricep (C7) 2/4 triceps (C7) 2/4  Brachioradialis (C6) 2/4 Brachioradialis (C6) 2/4  Lower Extremity Lower Extremity  quadriceps (L-2 to L-4) 2/4 quadriceps (L-2 to L-4) 2/4  Achilles (S1) 2/4 Achilles (S1) 2/4  Plantars:  Right: downgoing Left: upgoing  Cerebellar:  normal finger-to-nose, normal heel-to-shin test  Gait:  No tested.    ASSESSMENT [name] is a 79 y.o. female presenting with left hemiparesis and dysarthria. Status post IV t-PA 1147 at 09/24/2013 followed by revascularization of occluded proximal R  MCA prox using IA tpa and Solitaire retrieval device. Imaging confirms a right MCA infarct. Infarct felt to be  embolic secondary to atrial fibrillation.  On aspirin 81 mg orally every day prior to admission. Now on aspirin 325 mg orally every day for secondary stroke prevention. Patient with resultant left hemiparesis, dysphagia. Work up underway.  Intubation for neurointervention only, extubated.  Pulmonary edema per CXR yest, CXR today pending atrial fibrillation, unknown if new or old, not on anticoagulation prior to admission, maximized IV diltiazem for rate control. Amiodarone added w/ QRS widening, now back in NSR  CHA2DS2-VASc Score for Atrial Fibrillation Stroke Risk = 7   Age in Years:   ?20 +2    Sex:   Female +1    Congestive Heart Failure History:  yes +1   Hypertension History:  Yes +1   Stroke/TIA/Thromboembolism History:  yes +2   Vascular Disease History:  no   Diabetes Mellitus: no  Hypokalemia,  K 4.1->3.1->2.4, IV's infiltrated, replacement ordered once PICC placed  Blank stare, ? Etiology, EEG ordered Hypertension, on diazide prior to admission Elevated  liver enzymes, mild Anemia, acute blood loss post neurointervention and dehydration 12.2->9.8 Thyroid disease, on synthroid PTA Dehydration, Cr 1.03->0.87 Hgb 9.8  Hospital day # 5  TREATMENT/PLAN  Continue ICU level care given  amiodarone x 24 more hours and cardizem drip - CCM managing   Continue aspirin 325 mg orally every day x 1 week for secondary stroke prevention due to large size of stroke; start anticoagulation (eliquis) at 1 week post stroke  F/u EEG  F/u CXR  Rehab consult  PICC placement then potassium IV  Agree with plans for transfer to step down as pt now off drips  Dorothy Main, MSN, RN, ANVP-BC, ANP-BC, GNP-BC [name] Pager: [phone] 09/29/2013 8:59 AM   I have personally obtained a history, examined the patient, evaluated imaging results, and formulated the assessment and plan of care. I agree with the above. Dorothy Heady, MD

## 2013-09-29 NOTE — Progress Notes (Signed)
CRITICAL VALUE ALERT  Critical value received: k+ 2.4  Date of notification:  09/29/2013  Time of notification:  0445 Critical value read back:yes  Nurse who received alert:  Philis Nettle  MD notified (1st page):  Blackbox  Time of first page:  0445  MD notified (2nd page): Blackbox  Time of second HQIO:[id number]  Responding MD:  Blackbox  Time MD responded:  [phone]

## 2013-09-29 NOTE — Progress Notes (Signed)
Spoke with pt about her wishes if she is unable to make her own decisions.  Pt states that she wants her husband to make her decisions for her if she is no longer able to.  Questionable if husband is actually her legal husband.  NOK is pt's nephew, Dorothy [name].  However pt states that she does not want her nephew to make decisions for her at this time.  Questionable if pt is completely oriented, and decisions will default to nephew.  Called Dr. Pearlean Brownie to notify him of this.  Speech cognitive eval ordered at this time.  Also addressed that no code status is on file.

## 2013-09-29 NOTE — Progress Notes (Signed)
PULMONARY  / CRITICAL CARE MEDICINE CONSULTATION  Name: Dorothy [name] MRN: [id number] DOB: [birth year]    ADMISSION DATE:  09/24/2013  CHIEF COMPLAINT:  Stroke   BRIEF PATIENT DESCRIPTION: 79 yo female with atrial fibrillation who presented with left hemiparesis and dysarthria, now status post cerebral angiogram with revascularization of occluded right MCA.  Speech recommend puree diet at this time after swallow evaluation.  PT recommend inpatient rehabs.  Pending neurology final recommendation on anticoagulation.  She has AFIB that was rate controlled with Cardizem initially but then switch to Amiodarone due to borderline blood pressure.  PO Cardizem was added to Amiodarone yesterday, patient is now rate control and borderline low blood pressure.  SIGNIFICANT EVENTS / STUDIES:  1. CT Head 09/24/2013 11:34 - suggestive of R MCA acute infarct, no intracranial hemorrhage 2. Alteplase 09/24/2013 11:45 3. IR R common carotid arteriogram, revascularization of occluded R MCA with tPA and 2 passes with retrieval device 09/24/2013 4. CT Head 09/24/2013 17:34 - mild evolution R MCA acute infarct, no intracranial hemorrhage 5. Intubation for procedure 09/24/2013, no extubated sine 12/07 6. MRI 09/27/13 >>> right MCA nonhemorrhagic infarct with mild local mass effect and no midline shift 7. 12/10 CT Head >>> stable right MCA infraction, associated edema, no new infarct nor evolvement  LINES / TUBES: 1. ETT 09/24/2013 >>> 12/07  2. L radial art line 09/24/2013 >>> 3. R femoral art line 09/24/2013 >>> 12/07  CULTURES: None  ANTIBIOTICS: 1. Cefazolin 2 g IV x 1 12/6/201  SUBJECTIVE:  Seen and examined.  Drowsy in setting of she was awake all night last night.  Interact appropriately after orientation.  VITAL SIGNS: Temp:  [97.9 F (36.6 C)-99.3 F (37.4 C)] 99.1 F (37.3 C) (12/11 0600) Pulse Rate:  [67-115] 71 (12/11 0600) Resp:  [17-32] 21 (12/11 0600) BP: (94-118)/(46-76) 109/55 mmHg (12/11  0600) SpO2:  [91 %-100 %] 100 % (12/11 0600)   VENTILATOR SETTINGS:    INTAKE / OUTPUT: Intake/Output     12/10 0701 - 12/11 0700 12/11 0701 - 12/12 0700   I.V. (mL/kg) 1439.1 (29.4)    IV Piggyback 400    Total Intake(mL/kg) 1839.1 (37.5)    Urine (mL/kg/hr) 2580 (2.2)    Stool 1 (0)    Total Output 2581     Net -741.9          Stool Occurrence 2 x      PHYSICAL EXAMINATION: General:  Elderly and tire appearing female in no distress but appears drowsy and sleepy HEENT: NCAT, EOMI, no eye crust/discharge, no interval inprovement Neck:  No JVD noted  Cardiovascular:  NSR, no murmur/rub/gallop Lungs:  Bilateral rhonchi anteriorly Abdomen:  +BS, soft, nontender, nondistended Musculoskeletal:  Intact, no edema Skin:  No rash noted   LABS:  CBC Recent Labs     09/28/13  0345  WBC  11.6*  HGB  10.8*  HCT  33.3*  PLT  356    Coag's No results found for this basename: APTT, INR,  in the last 72 hours  BMET Recent Labs     09/27/13  1045  09/28/13  0345  09/29/13  0338  NA  144  142  144  K  4.1  3.1*  2.4*  CL  106  106  103  CO2  17*  23  29  BUN  23  22  21   CREATININE  0.81  0.81  0.84  GLUCOSE  155*  186*  128*    Electrolytes  Recent Labs     09/26/13  1325  09/27/13  1045  09/28/13  0345  09/29/13  0338  CALCIUM  8.9  8.9  8.7  8.5  MG  2.0   --    --    --   PHOS  2.6   --    --    --     Sepsis Markers No results found for this basename: LACTICACIDVEN, PROCALCITON, O2SATVEN,  in the last 72 hours  ABG No results found for this basename: PHART, PCO2ART, PO2ART,  in the last 72 hours  Liver Enzymes Recent Labs     09/26/13  1325  09/27/13  1045  AST  109*  72*  ALT  94*  78*  ALKPHOS  90  89  BILITOT  0.5  0.4  ALBUMIN  2.4*  2.6*    Cardiac Enzymes No results found for this basename: TROPONINI, PROBNP,  in the last 72 hours  Glucose Recent Labs     09/28/13  0915  09/28/13  1200  09/28/13  1550  09/28/13  1959   09/29/13  0305  09/29/13  0754  GLUCAP  138*  230*  214*  184*  134*  124*    Imaging Ct Head Wo Contrast  09/28/2013   CLINICAL DATA:  Altered mental status  EXAM: CT HEAD WITHOUT CONTRAST  TECHNIQUE: Contiguous axial images were obtained from the base of the skull through the vertex without intravenous contrast.  COMPARISON:  Brain MRI September 27, 2013  FINDINGS: Mild atrophy is stable. The right middle cerebral artery distribution infarct noted 1 day prior is again noted. There is now edema involving portions of the posterior right frontal lobe, much of the mid to superior right temporal lobe, and most of the right basal ganglia. Note that the head of the caudate nucleus is also involved with this acute infarct; there is edema in this area. This infarct involves a small portion of the anterior right parietal lobe. The distribution of infarct appear stable compared to 1 day prior.  Beyond localized cytotoxic edema, there is no appreciable mass effect. No well-defined mass is seen. There is no hemorrhage. There is no extra-axial fluid collection or midline shift. Rather minimal small vessel disease in the centra semiovale bilaterally is stable.  The bony calvarium appears intact.  The mastoid air cells are clear.  IMPRESSION: The distribution of territory involved with the middle cerebral artery infarct on the right is essentially stable compared to 1 day prior. There is localized cytotoxic edema throughout much of the area of infarct with relative effacement of sulci in these areas. Note that there is edema of the head of the caudate nucleus on the right, a finding also noted 1 day prior. Most likely, the recurrent artery of [place] on the right which normally supplies the head of the caudate nucleus arises from the middle cerebral artery in this case, an anatomic variant. No acute hemorrhage seen. No new infarct appreciated compared to 1 day prior.   Electronically Signed   By: Bretta Bang M.D.    On: 09/28/2013 17:49   Mr Brain Wo Contrast  09/27/2013   CLINICAL DATA:  Left hemiparesis post tPA and clot retrieval. Atrial fibrillation.  EXAM: MRI HEAD WITHOUT CONTRAST  TECHNIQUE: Multiplanar, multiecho pulse sequences of the brain and surrounding structures were obtained without intravenous contrast.  COMPARISON:  11/26/2012 CT.  No comparison MR.  FINDINGS: Large acute/ subacute right middle cerebral artery distribution infarct  involving right frontal lobe, right anterior temporal lobe, right subinsular region, right peri operculum region, right parietal lobe, right lenticular nucleus and right caudate head.  No intracranial hemorrhage. There is mild local mass effect upon the right lateral ventricle without midline shift.  Atrophy without hydrocephalus.  No intracranial mass lesion noted on this unenhanced exam.  Transverse ligament hypertrophy. Cervical spondylotic changes without spinal stenosis. Cervical medullary junction, pituitary region, pineal region and orbital structures unremarkable.  Major intracranial vascular structures appear patent.  IMPRESSION: Large acute/subacute right middle cerebral artery distribution nonhemorrhagic infarct with mild local mass effect without midline shift as detailed above.  These results will be called to the ordering clinician or representative by the Radiologist Assistant, and communication documented in the PACS Dashboard.   Electronically Signed   By: Bridgett Larsson M.D.   On: 09/27/2013 15:00   Dg Chest Port 1 View  09/28/2013   CLINICAL DATA:  Pulmonary edema  EXAM: PORTABLE CHEST - 1 VIEW  COMPARISON:  09/26/2013  FINDINGS: Cardiac shadow is stable. Persistent bilateral changes of pulmonary edema are noted. These are worsened in the interval particularly on the right side. Small effusions are noted bilaterally. No focal confluent infiltrate is seen.  IMPRESSION: Worsening pulmonary edema.   Electronically Signed   By: Alcide Clever M.D.   On: 09/28/2013  08:08   Dg Swallowing Func-speech Pathology  09/27/2013   Riley Nearing [place], CCC-SLP     09/27/2013  2:04 PM Objective Swallowing Evaluation: Modified Barium Swallowing Study   Patient Details  Name: Dorothy [name] MRN: [id number] Date of Birth: Mar 28, 1931  Today's Date: 09/27/2013 Time: 1300-1330 SLP Time Calculation (min): 30 min  Past Medical History:  Past Medical History  Diagnosis Date  . Stroke   . Atrial fibrillation    Past Surgical History: History reviewed. No pertinent past  surgical history. HPI:  79 yo female with atrial fibrillation who presented with left  hemiparesis and dysarthria, now status post cerebral angiogram  with revascularization of occluded right MCA.     Assessment / Plan / Recommendation Clinical Impression  Dysphagia Diagnosis: Moderate oral phase dysphagia;Severe  pharyngeal phase dysphagia Clinical impression: Pt presents with moderate to severe  dysphagia with left oral and lingual weakness leading to left  residuals and decreased bolus propulsion. Pharyngeal phase  characterized by weakness particularly of base of tongue and  hyolaryngeal elevation/excursion with limited opening of UES.  Suspect baseline UES hypertrophy. Pt with decreased closure of  airway during the swallow with penetration of liquid textures  with no sensation. There are moderate to severe resdiuals  remaining post swallow. Postures not effective in decreasing  residuals.  Recommend pt initiate a puree/pudding thick diet with the hopes  that use of the swallow mechanism will improve function. Pt may  need alternate method of nutrition if she does not sustain  arousal for meals and does not have appropriate intake. SLP hopes  to progress to liquid textures in a few days.     Treatment Recommendation  Therapy as outlined in treatment plan below    Diet Recommendation Dysphagia 1 (Puree);Pudding-thick liquid   Liquid Administration via: Spoon Medication Administration: Crushed with puree Supervision: Staff to  assist with self feeding;Full  supervision/cueing for compensatory strategies Compensations: Slow rate;Small sips/bites;Multiple dry swallows  after each bite/sip Postural Changes and/or Swallow Maneuvers: Seated upright 90  degrees    Other  Recommendations Oral Care Recommendations: Oral care BID Other Recommendations: Order thickener from pharmacy   Follow Up  Recommendations  Inpatient Rehab    Frequency and Duration min 2x/week  2 weeks   Pertinent Vitals/Pain NA    SLP Swallow Goals     General HPI: 79 yo female with atrial fibrillation who presented  with left hemiparesis and dysarthria, now status post cerebral  angiogram with revascularization of occluded right MCA. Type of Study: Modified Barium Swallowing Study Reason for Referral: Objectively evaluate swallowing function Diet Prior to this Study: NPO Temperature Spikes Noted: No Respiratory Status: Nasal cannula History of Recent Intubation: Yes Length of Intubations (days): 2 days Date extubated: 09/25/13 Behavior/Cognition: Alert;Cooperative Oral Cavity - Dentition: Edentulous Oral Motor / Sensory Function: Impaired - see Bedside swallow  eval Self-Feeding Abilities: Total assist Patient Positioning: Upright in chair Baseline Vocal Quality: Clear Volitional Cough: Weak Volitional Swallow: Able to elicit Anatomy: Within functional limits Pharyngeal Secretions: Not observed secondary MBS    Reason for Referral Objectively evaluate swallowing function   Oral Phase Oral Preparation/Oral Phase Oral Phase: Impaired Oral - Honey Oral - Honey Teaspoon: Left anterior bolus loss;Weak lingual  manipulation;Left pocketing in lateral sulci;Lingual/palatal  residue;Delayed oral transit;Reduced posterior  propulsion;Incomplete tongue to palate contact Oral - Nectar Oral - Nectar Teaspoon: Left anterior bolus loss;Weak lingual  manipulation;Left pocketing in lateral sulci;Lingual/palatal  residue;Delayed oral transit;Reduced posterior  propulsion;Incomplete tongue to  palate contact Oral - Solids Oral - Puree: Left anterior bolus loss;Weak lingual  manipulation;Left pocketing in lateral sulci;Lingual/palatal  residue;Delayed oral transit;Reduced posterior  propulsion;Incomplete tongue to palate contact   Pharyngeal Phase Pharyngeal Phase Pharyngeal Phase: Impaired Pharyngeal - Honey Pharyngeal - Honey Teaspoon: Delayed swallow initiation;Reduced  pharyngeal peristalsis;Reduced epiglottic inversion;Reduced  anterior laryngeal mobility;Reduced laryngeal elevation;Reduced  airway/laryngeal closure;Reduced tongue base  retraction;Pharyngeal residue - valleculae;Pharyngeal residue -  pyriform sinuses;Penetration/Aspiration during  swallow;Compensatory strategies attempted (Comment) (chin tuck,  not effective) Penetration/Aspiration details (honey teaspoon): Material enters  airway, CONTACTS cords and not ejected out;Material does not  enter airway Pharyngeal - Nectar Pharyngeal - Nectar Teaspoon: Delayed swallow initiation;Reduced  pharyngeal peristalsis;Reduced epiglottic inversion;Reduced  anterior laryngeal mobility;Reduced laryngeal elevation;Reduced  airway/laryngeal closure;Reduced tongue base  retraction;Pharyngeal residue - valleculae;Pharyngeal residue -  pyriform sinuses;Penetration/Aspiration during swallow Penetration/Aspiration details (nectar teaspoon): Material enters  airway, passes BELOW cords without attempt by patient to eject  out (silent aspiration) Pharyngeal - Solids Pharyngeal - Puree: Delayed swallow initiation;Reduced pharyngeal  peristalsis;Reduced epiglottic inversion;Reduced anterior  laryngeal mobility;Reduced laryngeal elevation;Reduced  airway/laryngeal closure;Reduced tongue base  retraction;Pharyngeal residue - valleculae;Pharyngeal residue -  pyriform sinuses;Compensatory strategies attempted (Comment)  (chin tuck not effective. ) Penetration/Aspiration details (puree): Material does not enter  airway  Cervical Esophageal Phase    GO    Cervical  Esophageal Phase Cervical Esophageal Phase: Impaired        Harlon Ditty, MA CCC-SLP [phone]  Dyanne Iha Riley Nearing 09/27/2013, 2:03 PM      CXR:  12/09 >>> Interval increase in pulmonary edema  ASSESSMENT / PLAN:  79 yo female with atrial fibrillation, presenting with R MCA stroke s/p tPA and R common carotid arteriogram with revascularization of prox R MCA, now on mechanical ventilation post-procedure. Extubate 12/07.  She is currently awaiting neurology recommendation on anticoagulation for afib and rehab placement.  #. Right MCA Stroke  --> s/p tPA, anteriogram with revascularization  --> Left UE weakness remains  --> Dysphagia on swallow evaluation - ASA, Lipitor - Puree diet, consider alternative if patient is not awake/alert enough to have adequate diet grater 60% of daily needs - MRI without hemorrhagic, no anticoagulation at  this time due to >50% involvement per neuro - Currently on Lovenox low dose - Rehab consult per neurology  #. New Onset Atrial Fibrillation  --> Was rhythm controlled with Amiodarone for 2 days or so  --> Afib without rate control last night  --> Currently on both Amiodarone drip and Cardizem drip   --> BP remains borderline low  --> brady intermittent - s/p tPA and currently on DVT prophylaxis dose of Lovenox - CHA2DS2 VASc score 6, will need long term anticoagulation, for MRI - On ASA and Lovenox - LFTs is trending down with Amiodarone use, dc amio load IV, add oral - add hold parameters Cardizem, may reduce slight  #. Acute Anemia, dilution likely  --> Likely due to blood loss and dilution  --> Admission hgb 12.2 now at 10.8, improving -  AM CBC - Transfuse if level drop below 7gm/dL hemoconcentration noted  #. Interstial Edema/PAH / effusions rt  Greater left  --> Secondary to AFIB w/ RVR and positive balance  --> ECHO with decreased EF at 30-35% - Will increase Lasix to 40mg  to q6h, was only 600 cc neg goal 1.5 liters - PCXR in am,  is some of this atx, IS when able  #. Mild LFTs Elevation  --> Likely no related to Amiodarone  --> Repeat LFTs with level trending down - Continue to monitor  # Mild Mixed Acidosis - Resolved - Monitor  #. Hypokalemia  --> Potassium level at 3.1 - Potassium replace by [facility] Repeat lytes with mag, phos in am   Consider transfer today to sdu  Dc amio drip, reduce cardizem  Mcarthur Rossetti. Tyson Alias, MD, FACP Pgr: [phone] [facility]

## 2013-09-29 NOTE — Procedures (Signed)
ELECTROENCEPHALOGRAM REPORT   Patient: Dorothy [name]       Room #: 1O10 EEG No. ID: [id number] Age: 79 y.o.        Sex: female Referring Physician: Pearlean Brownie Report Date:  09/29/2013        Interpreting Physician: Aline Brochure  History: [name] is an 79 y.o. female admitted with acute MCA stroke, noted to have exhibited a blank stare with reduced responsiveness at the onset of her symptoms.  Indications for study:  Rule out seizure disorder.  Technique: This is an 18 channel routine scalp EEG performed at the bedside with bipolar and monopolar montages arranged in accordance to the international 10/20 system of electrode placement.   Description: Background activity consisted of low amplitude diffuse irregular 1-2 Hz symmetrical delta activity with superimposed 6-7 Hz theta activity which at times was rhythmic posteriorly. Occasional faster rhythm was recorded from the central head regions resembling sleep spindles. Photic stimulation was not performed. Hyperventilation was not performed. Patient was noted to be unresponsive during the study despite external auditory stimuli. No epileptiform discharges were recorded.  Interpretation: This EEG is abnormal with moderately severe nonspecific continuous generalized slowing of cerebral activity. This pattern is nonspecific and can be seen with degenerative as well as metabolic encephalopathies. No evidence of an epileptic disorder is demonstrated.   Venetia Maxon M.D. [facility] [phone]

## 2013-09-29 NOTE — Plan of Care (Signed)
Problem: Progression Outcomes Goal: If vent dependent, tolerates weaning Outcome: Not Met (add Reason) Pt is on 2L [place]

## 2013-09-29 NOTE — Progress Notes (Signed)
EEG Completed; Results Pending  

## 2013-09-29 NOTE — Progress Notes (Signed)
Rehab admissions - Following for rehab needs.  I will meet with patient and talk to family and follow up.  Call me for questions.  #[phone]

## 2013-09-29 NOTE — Progress Notes (Signed)
Physical Therapy Treatment Patient Details Name: Dorothy [name] MRN: [id number] DOB: April 08, 1931 Today's Date: 09/29/2013 Time: 7829-5621 PT Time Calculation (min): 24 min  PT Assessment / Plan / Recommendation  History of Present Illness Pt found to have R MCA infarct   PT Comments   Patient making improvements with sitting balance, mobility, and gait.  Initially very lethargic.  Once upright, more alert and able to participate with therapy.  Follow Up Recommendations  CIR     Does the patient have the potential to tolerate intense rehabilitation     Barriers to Discharge        Equipment Recommendations  Other (comment) (TBD)    Recommendations for Other Services Rehab consult  Frequency Min 4X/week   Progress towards PT Goals Progress towards PT goals: Progressing toward goals  Plan Current plan remains appropriate    Precautions / Restrictions Precautions Precautions: Fall Restrictions Weight Bearing Restrictions: No   Pertinent Vitals/Pain     Mobility  Bed Mobility Bed Mobility: Supine to Sit;Sitting - Scoot to Edge of Bed;Sit to Supine Supine to Sit: 2: Max assist Sitting - Scoot to [place] of Bed: 3: Mod assist Sit to Supine: HOB flat;3: Mod assist Details for Bed Mobility Assistance: Verbal and tactile cues for mobility.  Used bed pad initially to get patient to EOB.  Once sitting, patient more alert.  Able to assist with scooting to EOB.  At end of session, patient assisted with returning to supine. Transfers Transfers: Sit to Stand;Stand to Sit Sit to Stand: 1: +2 Total assist;With upper extremity assist;From bed Sit to Stand: Patient Percentage: 60% Stand to Sit: 1: +1 Total assist;With upper extremity assist;To bed Details for Transfer Assistance: Verbal cues for hand placement.  Physical assist for LUE.  Once standing, patient required assist to hold Lt hand on RW.  Verbal cues to hold head up.  Patient performed x2. Ambulation/Gait Ambulation/Gait  Assistance: 1: +2 Total assist Ambulation/Gait: Patient Percentage: 60% Ambulation Distance (Feet): 3 Feet (3' forward and backward x2 with sitting rest break between) Assistive device: Rolling walker Ambulation/Gait Assistance Details: Verbal cues to stand upright and hold head up.  Assist to maneuver RW.  Patient able to take 3-4 steps forward and backward with +2 assist and assist to maneuver RW.  Repeated x2 with sitting rest break between. Gait Pattern: Step-through pattern;Decreased stride length;Shuffle;Trunk flexed Modified Rankin (Stroke Patients Only) Pre-Morbid Rankin Score: No symptoms Modified Rankin: Moderately severe disability      PT Goals (current goals can now be found in the care plan section)    Visit Information  Last PT Received On: 09/29/13 Assistance Needed: +2 History of Present Illness: Pt found to have R MCA infarct    Subjective Data  Subjective: "I'll do what you tell me to"     Cognition  Cognition Arousal/Alertness: Lethargic Behavior During Therapy: Flat affect Overall Cognitive Status: Impaired/Different from baseline General Comments: Difficulty arousing initially.  Once upright, more alert.  Question if not sleeping at night.    Balance  Balance Balance Assessed: Yes Static Sitting Balance Static Sitting - Balance Support: Right upper extremity supported;Feet supported Static Sitting - Level of Assistance: 5: Stand by assistance Static Sitting - Comment/# of Minutes: 5 Static Standing Balance Static Standing - Balance Support: Bilateral upper extremity supported Static Standing - Level of Assistance: 3: Mod assist Static Standing - Comment/# of Minutes: 2 minutes with posterior lean and lean to left.  End of Session PT - End of Session Equipment Utilized  During Treatment: Gait belt Activity Tolerance: Patient limited by lethargy;Patient limited by fatigue Patient left: in bed;with call bell/phone within reach Nurse Communication:  Mobility status   GP     Vena Austria 09/29/2013, 5:10 PM Durenda Hurt. Renaldo Fiddler, [facility] Acute Rehab Services Pager [phone]

## 2013-09-30 ENCOUNTER — Inpatient Hospital Stay (HOSPITAL_COMMUNITY): Payer: Medicare Other

## 2013-09-30 LAB — BASIC METABOLIC PANEL
BUN: 23 mg/dL (ref 6–23)
Chloride: 105 mEq/L (ref 96–112)
Creatinine, Ser: 0.91 mg/dL (ref 0.50–1.10)
GFR calc Af Amer: 67 mL/min — ABNORMAL LOW (ref >90)
GFR calc non Af Amer: 58 mL/min — ABNORMAL LOW (ref >90)
Potassium: 2.6 mEq/L — CL (ref 3.5–5.1)

## 2013-09-30 LAB — GLUCOSE, CAPILLARY
Glucose-Capillary: 110 mg/dL — ABNORMAL HIGH (ref 70–99)
Glucose-Capillary: 106 mg/dL — ABNORMAL HIGH (ref 70–99)
Glucose-Capillary: 143 mg/dL — ABNORMAL HIGH (ref 70–99)
Glucose-Capillary: 129 mg/dL — ABNORMAL HIGH (ref 70–99)
Glucose-Capillary: 149 mg/dL — ABNORMAL HIGH (ref 70–99)

## 2013-09-30 LAB — PHOSPHORUS: Phosphorus: 2.1 mg/dL — ABNORMAL LOW (ref 2.3–4.6)

## 2013-09-30 MED ORDER — POTASSIUM CHLORIDE 20 MEQ/15ML (10%) PO LIQD
60.0000 meq | Freq: Once | ORAL | Status: AC
  Administered 2013-09-30: 60 meq
  Filled 2013-09-30: qty 60

## 2013-09-30 MED ORDER — POTASSIUM CHLORIDE 10 MEQ/100ML IV SOLN
10.0000 meq | INTRAVENOUS | Status: AC
  Administered 2013-09-30 (×4): 10 meq via INTRAVENOUS
  Filled 2013-09-30 (×2): qty 100

## 2013-09-30 MED ORDER — POTASSIUM CHLORIDE 10 MEQ/100ML IV SOLN
10.0000 meq | Freq: Once | INTRAVENOUS | Status: AC
  Administered 2013-09-30: 10 meq via INTRAVENOUS
  Filled 2013-09-30: qty 100

## 2013-09-30 NOTE — Procedures (Signed)
Objective Swallowing Evaluation: Modified Barium Swallowing Study  Patient Details  Name: Dorothy [name] MRN: [id number] Date of Birth: 1930/12/05  Today's Date: 09/30/2013 Time: 1300-1320 SLP Time Calculation (min): 20 min  Past Medical History:  Past Medical History  Diagnosis Date  . Stroke   . Atrial fibrillation    Past Surgical History:  Past Surgical History  Procedure Laterality Date  . Radiology with anesthesia N/A 09/24/2013    Procedure: RADIOLOGY WITH ANESTHESIA;  Surgeon: Oneal Grout, MD;  Location: [facility] OR;  Service: Radiology;  Laterality: N/A;   HPI:  79 yo female with atrial fibrillation who presented with left hemiparesis and dysarthria, now status post cerebral angiogram with revascularization of occluded right MCA.     Assessment / Plan / Recommendation Clinical Impression  Dysphagia Diagnosis: Moderate oral phase dysphagia;Severe pharyngeal phase dysphagia Clinical impression: Pt continues to present with a moderate oral phase dysphagia and severe pharyngeal phase dysphagai despite much improved arousal and timeliness of swallow. There is still decreased oral control of bolus with passive transit of bolus to pharynx, particularly with nectar thick liquids, worsening aspiration before the swallow. Cues to orally hold bolus and tuck chin improve timing of transit and swallow response with honey thick liquids via teaspoon. Pharyngeal phase still characterized by late airway protection with signficant silent aspriation of nectar thick liquids. Strength of hyloaryngeal mechanism slightly improved with less vallecular and pharyngeal residuals, though still present. Chin tuck reduces honey thick residuals signficantly during todays assessment. Recommend upgrade to dys 1 (puree) and honey thick liquids via teaspoon with chin tuck. Hopeful for continued gradual improvement though likely there was a baseline pharyngeal dysphagia due to UES hypertrophy.     Treatment  Recommendation  Therapy as outlined in treatment plan below    Diet Recommendation Dysphagia 1 (Puree);Honey-thick liquid   Liquid Administration via: Spoon Medication Administration: Crushed with puree Supervision: Staff to assist with self feeding;Full supervision/cueing for compensatory strategies Compensations: Slow rate;Small sips/bites;Multiple dry swallows after each bite/sip;Effortful swallow Postural Changes and/or Swallow Maneuvers: Seated upright 90 degrees;Chin tuck    Other  Recommendations Oral Care Recommendations: Oral care BID Other Recommendations: Order thickener from pharmacy   Follow Up Recommendations  Inpatient Rehab    Frequency and Duration min 2x/week  2 weeks   Pertinent Vitals/Pain NA    SLP Swallow Goals     General HPI: 79 yo female with atrial fibrillation who presented with left hemiparesis and dysarthria, now status post cerebral angiogram with revascularization of occluded right MCA. Type of Study: Modified Barium Swallowing Study Reason for Referral: Objectively evaluate swallowing function Previous Swallow Assessment: MBS 09/26/13 Diet Prior to this Study: Dysphagia 1 (puree);Pudding-thick liquids Temperature Spikes Noted: No Respiratory Status: Nasal cannula History of Recent Intubation: Yes Length of Intubations (days): 2 days Date extubated: 09/25/13 Behavior/Cognition: Alert;Cooperative;Pleasant mood Oral Cavity - Dentition: Edentulous Oral Motor / Sensory Function: Impaired - see Bedside swallow eval Self-Feeding Abilities: Needs assist Patient Positioning: Upright in chair Baseline Vocal Quality: Clear Volitional Cough: Weak Volitional Swallow: Able to elicit Anatomy:  (prominent CP/CP Bar) Pharyngeal Secretions: Not observed secondary MBS    Reason for Referral Objectively evaluate swallowing function   Oral Phase Oral Preparation/Oral Phase Oral Phase: Impaired Oral - Honey Oral - Honey Teaspoon: Left anterior bolus  loss;Weak lingual manipulation;Left pocketing in lateral sulci;Lingual/palatal residue;Delayed oral transit;Reduced posterior propulsion;Incomplete tongue to palate contact Oral - Nectar Oral - Nectar Teaspoon: Left anterior bolus loss;Weak lingual manipulation;Left pocketing in lateral sulci;Lingual/palatal residue;Delayed oral transit;Reduced  posterior propulsion;Incomplete tongue to palate contact Oral - Solids Oral - Puree: Delayed oral transit;Reduced posterior propulsion;Weak lingual manipulation   Pharyngeal Phase Pharyngeal Phase Pharyngeal Phase: Impaired Pharyngeal - Honey Pharyngeal - Honey Teaspoon: Delayed swallow initiation;Premature spillage to valleculae;Reduced pharyngeal peristalsis;Reduced epiglottic inversion;Reduced anterior laryngeal mobility;Reduced laryngeal elevation;Reduced tongue base retraction;Penetration/Aspiration after swallow;Penetration/Aspiration during swallow;Pharyngeal residue - valleculae;Pharyngeal residue - pyriform sinuses;Compensatory strategies attempted (Comment) (chin tuck) Penetration/Aspiration details (honey teaspoon): Material does not enter airway;Material enters airway, remains ABOVE vocal cords and not ejected out Pharyngeal - Nectar Pharyngeal - Nectar Teaspoon: Delayed swallow initiation;Reduced pharyngeal peristalsis;Reduced epiglottic inversion;Reduced anterior laryngeal mobility;Reduced laryngeal elevation;Reduced tongue base retraction;Penetration/Aspiration before swallow;Moderate aspiration (chin tuck) Penetration/Aspiration details (nectar teaspoon): Material enters airway, passes BELOW cords without attempt by patient to eject out (silent aspiration) Pharyngeal - Solids Pharyngeal - Puree: Delayed swallow initiation;Reduced pharyngeal peristalsis;Reduced epiglottic inversion;Reduced tongue base retraction;Reduced anterior laryngeal mobility (chin tuck) Penetration/Aspiration details (puree): Material does not enter airway  Cervical  Esophageal Phase    GO    Cervical Esophageal Phase Cervical Esophageal Phase: Impaired Cervical Esophageal Phase - Comment Cervical Esophageal Comment: Pt with appearance of prominent CP/CP bar        Harlon Ditty, MA CCC-SLP [phone]  Dorothy [name] 09/30/2013, 2:04 PM

## 2013-09-30 NOTE — Progress Notes (Signed)
Speech Language Pathology Treatment: Dysphagia;Cognitive-Linquistic  Patient Details Name: Aerika Groll MRN: [id number] DOB: [birth year] Today's Date: 09/30/2013 Time: 1130-1150 SLP Time Calculation (min): 20 min  Assessment / Plan / Recommendation Clinical Impression  Pt making significant progress with cognition, speech intelligibility and dysphagia this session. Pt is maximally alert, responds appropriately to commands and verbal cues for increased volume and second swallow to clear possible residuals. Swallows are more timely and subjectively stronger. Pt is ready for repeat MBS for hopeful upgrade of liquid textures. Cognitively, due to improved arousal, pts awareness, attention and reasoning is much improved. She is able to verbalize deficits x3, though she does not fully recall conversations regarding dysphagia, she hypothesizes "I guess Im on these thick liquids because theyre afraid i might strangle." She can recall today's events and is able to repeat three words over 3 minute intervals with one category cue. Overall though deficits still present, pt is capable of understanding basic conversation and expressing her wants and needs. Ongoing SLP therapy needed, continue to recommend CIR.    HPI HPI: 79 yo female with atrial fibrillation who presented with left hemiparesis and dysarthria, now status post cerebral angiogram with revascularization of occluded right MCA.   Pertinent Vitals NA  SLP Plan  Continue with current plan of care;MBS    Recommendations Diet recommendations: Dysphagia 1 (puree);Pudding-thick liquid Liquids provided via: Teaspoon Medication Administration: Crushed with puree Supervision: Staff to assist with self feeding;Full supervision/cueing for compensatory strategies Compensations: Slow rate;Small sips/bites;Multiple dry swallows after each bite/sip;Effortful swallow Postural Changes and/or Swallow Maneuvers: Seated upright 90 degrees              General  recommendations: Rehab consult Oral Care Recommendations: Oral care BID Follow up Recommendations: Inpatient Rehab Plan: Continue with current plan of care;MBS    GO    Harlon Ditty, MA CCC-SLP [phone]  Claudine Mouton 09/30/2013, 12:11 PM

## 2013-09-30 NOTE — Progress Notes (Signed)
Physical Therapy Treatment Patient Details Name: Dorothy [name] MRN: [id number] DOB: Jan 29, 1931 Today's Date: 09/30/2013 Time: 5638-7564 PT Time Calculation (min): 25 min  PT Assessment / Plan / Recommendation  History of Present Illness Pt found to have R MCA infarct   PT Comments   Pt admitted with above. Pt currently with functional limitations due to balance and endurance deficits as well as left hemibody weakness and inattention.  Pt will benefit from skilled PT to increase their independence and safety with mobility to allow discharge to the venue listed below.  Follow Up Recommendations  CIR                 Equipment Recommendations  Other (comment) (TBA)    Recommendations for Other Services Rehab consult  Frequency Min 4X/week   Progress towards PT Goals Progress towards PT goals: Progressing toward goals  Plan Current plan remains appropriate    Precautions / Restrictions Precautions Precautions: Fall Precaution Comments: L inattention Restrictions Weight Bearing Restrictions: No   Pertinent Vitals/Pain VSS, No pain    Mobility  Bed Mobility Bed Mobility: Supine to Sit;Sitting - Scoot to Edge of Bed;Sit to Supine Rolling Right: Not tested (comment) Rolling Left: Not tested (comment) Supine to Sit: 2: Max assist Sitting - Scoot to Edge of Bed: 3: Mod assist Sit to Supine: Not Tested (comment) Details for Bed Mobility Assistance: Verbal and tactile cues for mobility. Incr assist for trunk elevation as pt able to move LEs with some incr assist to move her left LE.  Able to assist with scooting to EOB.  Transfers Transfers: Sit to Stand;Stand to Sit Sit to Stand: 1: +2 Total assist;With upper extremity assist;From bed Sit to Stand: Patient Percentage: 60% Stand to Sit: 1: +2 Total assist;With upper extremity assist;With armrests;To chair/3-in-1 Stand to Sit: Patient Percentage: 60% Details for Transfer Assistance: Verbal cues for hand placement.  Physical  assist for LUE.  Once standing, patient required assist to hold Lt hand on RW.  Verbal cues to hold head up.   Ambulation/Gait Ambulation/Gait Assistance: 1: +2 Total assist Ambulation/Gait: Patient Percentage: 60% Ambulation Distance (Feet): 15 Feet Assistive device: Rolling walker Ambulation/Gait Assistance Details: Pt needed verbal cues to stand upright and hold head up.  Assist to maneuver RW.  Pt needed assist to hold hand on RW on left entire time. Needed cues to sequence steps and RW.  Pt needed assist to stay close to RW as well.  Pt with posterior and left lateral lean as well.    Gait Pattern: Step-through pattern;Decreased stride length;Shuffle;Trunk flexed Gait velocity: decreased Stairs: No Wheelchair Mobility Wheelchair Mobility: No Modified Rankin (Stroke Patients Only) Pre-Morbid Rankin Score: No symptoms Modified Rankin: Moderately severe disability    Exercises General Exercises - Upper Extremity Shoulder Flexion: AAROM;Left;5 reps;Seated Shoulder ABduction: AAROM;Left;5 reps;Seated General Exercises - Lower Extremity Ankle Circles/Pumps: AROM;Both;10 reps;Seated Quad Sets: AROM;Both;10 reps;Supine Long Arc Quad: AROM;Both;10 reps;Seated Heel Slides: AROM;Both;10 reps;Seated   PT Goals (current goals can now be found in the care plan section)    Visit Information  Last PT Received On: 09/30/13 Assistance Needed: +2 History of Present Illness: Pt found to have R MCA infarct    Subjective Data  Subjective: "I want to try and get up."   Cognition  Cognition Arousal/Alertness: Lethargic Behavior During Therapy: Flat affect Overall Cognitive Status: Impaired/Different from baseline Area of Impairment: Orientation;Attention;Memory;Following commands;Safety/judgement;Awareness;Problem solving Orientation Level: Disoriented to;Place;Time Current Attention Level: Sustained Memory: Decreased recall of precautions;Decreased short-term memory Following Commands:  Follows  one step commands consistently Safety/Judgement: Decreased awareness of safety;Decreased awareness of deficits Awareness: Intellectual Problem Solving: Slow processing;Decreased initiation;Difficulty sequencing;Requires verbal cues;Requires tactile cues    Balance  Static Sitting Balance Static Sitting - Balance Support: Feet supported;Right upper extremity supported Static Sitting - Level of Assistance: 5: Stand by assistance Static Sitting - Comment/# of Minutes: 3 Static Standing Balance Static Standing - Balance Support: Bilateral upper extremity supported;During functional activity Static Standing - Level of Assistance: 3: Mod assist Static Standing - Comment/# of Minutes: 3 min with left lateral as well as posterior lean.    End of Session PT - End of Session Equipment Utilized During Treatment: Gait belt;Oxygen Activity Tolerance: Patient limited by fatigue Patient left: in chair;with call bell/phone within reach Nurse Communication: Mobility status        INGOLD,Shelby Peltz 09/30/2013, 10:59 AM [facility] [phone] (pager)

## 2013-09-30 NOTE — Progress Notes (Signed)
PULMONARY  / CRITICAL CARE MEDICINE CONSULTATION  Name: Dorothy [name] MRN: [id number] DOB: 08/11/1931    ADMISSION DATE:  09/24/2013  CHIEF COMPLAINT:  Stroke   BRIEF PATIENT DESCRIPTION: 79 yo female with atrial fibrillation who presented with left hemiparesis and dysarthria, now status post cerebral angiogram with revascularization of occluded right MCA.  Speech recommend puree diet at this time after swallow evaluation.  PT recommend inpatient rehabs.  Pending neurology final recommendation on anticoagulation.  She has AFIB that was rate controlled with Cardizem initially but then switch to Amiodarone due to borderline blood pressure.  PO Cardizem was added to Amiodarone yesterday, patient is now rate control and borderline low blood pressure.  SIGNIFICANT EVENTS / STUDIES:  1. CT Head 09/24/2013 11:34 - suggestive of R MCA acute infarct, no intracranial hemorrhage 2. Alteplase 09/24/2013 11:45 3. IR R common carotid arteriogram, revascularization of occluded R MCA with tPA and 2 passes with retrieval device 09/24/2013 4. CT Head 09/24/2013 17:34 - mild evolution R MCA acute infarct, no intracranial hemorrhage 5. Intubation for procedure 09/24/2013, no extubated sine 12/07 6. MRI 09/27/13 >>> right MCA nonhemorrhagic infarct with mild local mass effect and no midline shift 7. 12/10 CT Head >>> stable right MCA infraction, associated edema, no new infarct nor evolvement  LINES / TUBES: 1. ETT 09/24/2013 >>> 12/07  2. L radial art line 09/24/2013 >>>out 3. R femoral art line 09/24/2013 >>> 12/07  CULTURES: None  ANTIBIOTICS: 1. Cefazolin 2 g IV x 1 12/6/201  SUBJECTIVE:  Awake and interactive VITAL SIGNS: Temp:  [97.6 F (36.4 C)-99.1 F (37.3 C)] 98.2 F (36.8 C) (12/12 0830) Pulse Rate:  [66-103] 70 (12/12 0830) Resp:  [18-30] 25 (12/12 0830) BP: (97-123)/(39-70) 123/70 mmHg (12/12 0830) SpO2:  [93 %-100 %] 95 % (12/12 0830)      INTAKE / OUTPUT: Intake/Output     12/11 0701  - 12/12 0700 12/12 0701 - 12/13 0700   I.V. (mL/kg) 1105.1 (22.6)    IV Piggyback 900    Total Intake(mL/kg) 2005.1 (40.9)    Urine (mL/kg/hr) 3400 (2.9)    Stool     Total Output 3400     Net -1395            PHYSICAL EXAMINATION: General:  Elderly and in no distress HEENT: NCAT, EOMI, no eye crust/discharge, no interval improvement, oral thrush noted. Neck:  No JVD noted  Cardiovascular:  NSR, no murmur/rub/gallop Lungs:  Bilateral rhonchi anteriorly Abdomen:  +BS, soft, nontender, nondistended Musculoskeletal:  Intact, no edema Skin:  No rash noted   LABS:  CBC Recent Labs     09/28/13  0345  WBC  11.6*  HGB  10.8*  HCT  33.3*  PLT  356    Coag's No results found for this basename: APTT, INR,  in the last 72 hours  BMET Recent Labs     09/29/13  2000  09/29/13  2110  09/30/13  0430  NA  QUESTIONABLE RESULTS, RECOMMEND RECOLLECT TO VERIFY  145  146*  K  QUESTIONABLE RESULTS, RECOMMEND RECOLLECT TO VERIFY  2.7*  2.6*  CL  QUESTIONABLE RESULTS, RECOMMEND RECOLLECT TO VERIFY  100  105  CO2  QUESTIONABLE RESULTS, RECOMMEND RECOLLECT TO VERIFY  34*  33*  BUN  20  22  23   CREATININE  0.82  0.93  0.91  GLUCOSE  140*  159*  113*    Electrolytes Recent Labs     09/29/13  2000  09/29/13  2110  09/30/13  0430  CALCIUM  7.7*  8.3*  7.5*  MG   --    --   1.3*  PHOS   --    --   2.1*    Sepsis Markers No results found for this basename: LACTICACIDVEN, PROCALCITON, O2SATVEN,  in the last 72 hours  ABG No results found for this basename: PHART, PCO2ART, PO2ART,  in the last 72 hours  Liver Enzymes No results found for this basename: AST, ALT, ALKPHOS, BILITOT, ALBUMIN,  in the last 72 hours  Cardiac Enzymes No results found for this basename: TROPONINI, PROBNP,  in the last 72 hours  Glucose Recent Labs     09/29/13  1213  09/29/13  1638  09/29/13  2005  09/30/13  0018  09/30/13  0432  09/30/13  0834  GLUCAP  136*  123*  118*  143*  110*  106*     Imaging Ct Head Wo Contrast  09/28/2013   CLINICAL DATA:  Altered mental status  EXAM: CT HEAD WITHOUT CONTRAST  TECHNIQUE: Contiguous axial images were obtained from the base of the skull through the vertex without intravenous contrast.  COMPARISON:  Brain MRI September 27, 2013  FINDINGS: Mild atrophy is stable. The right middle cerebral artery distribution infarct noted 1 day prior is again noted. There is now edema involving portions of the posterior right frontal lobe, much of the mid to superior right temporal lobe, and most of the right basal ganglia. Note that the head of the caudate nucleus is also involved with this acute infarct; there is edema in this area. This infarct involves a small portion of the anterior right parietal lobe. The distribution of infarct appear stable compared to 1 day prior.  Beyond localized cytotoxic edema, there is no appreciable mass effect. No well-defined mass is seen. There is no hemorrhage. There is no extra-axial fluid collection or midline shift. Rather minimal small vessel disease in the centra semiovale bilaterally is stable.  The bony calvarium appears intact.  The mastoid air cells are clear.  IMPRESSION: The distribution of territory involved with the middle cerebral artery infarct on the right is essentially stable compared to 1 day prior. There is localized cytotoxic edema throughout much of the area of infarct with relative effacement of sulci in these areas. Note that there is edema of the head of the caudate nucleus on the right, a finding also noted 1 day prior. Most likely, the recurrent artery of [place] on the right which normally supplies the head of the caudate nucleus arises from the middle cerebral artery in this case, an anatomic variant. No acute hemorrhage seen. No new infarct appreciated compared to 1 day prior.   Electronically Signed   By: Bretta Bang M.D.   On: 09/28/2013 17:49   Dg Chest Port 1 View  09/30/2013   CLINICAL DATA:   Shortness of breath and assess edema.  EXAM: PORTABLE CHEST - 1 VIEW  COMPARISON:  09/29/2013  FINDINGS: PICC line tip in the SVC region. No significant change in the bilateral airspace disease. Probable bilateral pleural effusions. Heart size is upper limits of normal and not significantly changed. The trachea is midline. Chronic elevation of the right humeral head suggests a rotator cuff tear.  IMPRESSION: No significant change in the bilateral airspace disease or edema.  Probable bilateral pleural effusions.   Electronically Signed   By: Richarda Overlie M.D.   On: 09/30/2013 08:01   Dg Chest Port 1 View  09/29/2013  CLINICAL DATA:  Assess line placement  EXAM: PORTABLE CHEST - 1 VIEW  COMPARISON:  September 28, 2013  FINDINGS: There is interval placement of a left subclavian central venous line with distal tip in superior vena cava. There is no pleural line to suggest pneumothorax. There is severe pulmonary edema. Consolidation of bilateral lung bases with bilateral pleural effusions are unchanged. The heart size is probably enlarged. The mediastinal contour is stable. The soft tissues and osseous structures are stable.  IMPRESSION: Left subclavian central venous line with distal tip in the superior vena cava. There is no pneumothorax. Severe pulmonary edema. Consolidation of both lung bases with bilateral pleural effusions.   Electronically Signed   By: Sherian Rein M.D.   On: 09/29/2013 12:34     CXR:  12/09 >>> Interval increase in pulmonary edema  ASSESSMENT / PLAN:  79 yo female with atrial fibrillation, presenting with R MCA stroke s/p tPA and R common carotid arteriogram with revascularization of prox R MCA, now on mechanical ventilation post-procedure. Extubate 12/07.  She is currently awaiting neurology recommendation on anticoagulation for afib and rehab placement.  #. Right MCA Stroke  --> s/p tPA, anteriogram with revascularization  --> Left UE weakness remains  --> Dysphagia on swallow  evaluation - ASA, Lipitor - Puree diet, consider alternative if patient is not awake/alert enough to have adequate diet grater 60% of daily needs - MRI without hemorrhagic, no anticoagulation at this time due to >50% involvement per neuro - Currently on Lovenox low dose - Rehab consult per neurology BP 123/70  Pulse 70  Temp(Src) 98.2 F (36.8 C) (Oral)  Resp 25  Ht 5\' 5"  (1.651 m)  Wt 108 lb 0.4 oz (49 kg)  BMI 17.98 kg/m2  SpO2 95%  #. New Onset Atrial Fibrillation  --> Was rhythm controlled with Amiodarone for 2 days or so  --> Afib without rate control last night  --> Currently on both Amiodarone drip and Cardizem drip   --> BP stable  --> brady intermittent - s/p tPA and currently on DVT prophylaxis dose of Lovenox - CHA2DS2 VASc score 6, will need long term anticoagulation, for MRI - On ASA and Lovenox - LFTs is trending down with Amiodarone use, dc amio load IV, add oral - add hold parameters Cardizem, may reduce slight  #. Acute Anemia, dilution likely  Recent Labs  09/28/13 0345  HGB 10.8*     --> Likely due to blood loss and dilution  --> Admission hgb 12.2 now at 10.8, improving -  AM CBC - Transfuse if level drop below 7gm/dL hemoconcentration noted  #. Interstial Edema/PAH / effusions rt  Greater left  --> Secondary to AFIB w/ RVR and positive balance  --> ECHO with decreased EF at 30-35% - Will increase Lasix to 40mg  to q6h, 12-11 x 2 doses - PCXR  With [facility]  #. Mild LFTs Elevation  --> Likely no related to Amiodarone  --> Repeat LFTs with level trending down - Continue to monitor  # Mild Mixed Acidosis - Resolved - Monitor  #. Hypokalemia  Recent Labs Lab 09/29/13 2000 09/29/13 2110 09/30/13 0430  K QUESTIONABLE RESULTS, RECOMMEND RECOLLECT TO VERIFY 2.7* 2.6*      Replete as needed     Summary: PCCM will sign off, please call back if needed.  Brett Canales Minor ACNP [facility] Pollack PCCM Pager [phone] till 3 pm If no answer page  [phone] 09/30/2013, 11:50 AM  Patient seen and examined, agree with above note.  I dictated the care and orders written for this patient under my direction.  Alyson Reedy, MD [phone]

## 2013-09-30 NOTE — Progress Notes (Signed)
eLink Physician-Brief Progress Note Patient Name: Dorothy [name] DOB: 12-09-1930 MRN: [id number]  Date of Service  09/30/2013   HPI/Events of Note     eICU Interventions  Hypokalemia, repleted    Intervention Category Minor Interventions: Electrolytes abnormality - evaluation and management  MCQUAID, DOUGLAS 09/30/2013, 6:27 AM

## 2013-09-30 NOTE — Progress Notes (Signed)
Rehab admissions - I met briefly with patient this am.  I have called her husband and have left a message to call me.  I could potentially admit to acute inpatient rehab tomorrow if patient is medically stable and if husband in agreement.  Call me for questions.  #[phone]

## 2013-09-30 NOTE — Progress Notes (Signed)
Stroke Team Progress Note  HISTORY 79 y.o. female with a past medical history significant for atrial fibrillation, brought to [facility] ED by ambulance as a code stroke. She was last known well at 9 am today 09/24/2013, talking to her husband and suddenly became less responsive, fell to the floor, and was EMS arrived shew was not able to move the left side and had dysarthria. Never had similar symptoms before. Initial NIHSS 19.  CT brain revealed questionable ill defined area of hypodensity left MCA territory as well as question minimal hyperdensity of the right carotid terminus/M1 segment right middle cerebral artery raises the possibility of thrombus at this level. She was a candidate for IV TPA and it was administered. She is alert and awake, following commands, and started improving in then ED after receiving the IV tpa bolus.  Given hyperdense right MCA and NIH stroke scale of 19, concern for large vessel embolus. Patient was taken by interventional neuroradiology for a cerebral angiogram which showed occlusion of the right MCA proximally. She was administered intra-arterial TPA he 10.8 mg and 2 passes of the solitaire retrieval device for a TICI3 revascularization. She was intubated for airway protection during the procedure. She was admitted to the neuro ICU for further evaluation and treatment.  SUBJECTIVE No family at bedside. Sitting in bedside recliner.  OBJECTIVE Most recent Vital Signs: Filed Vitals:   09/29/13 2300 09/30/13 0427 09/30/13 0830 09/30/13 1159  BP: 97/49 105/62 123/70 109/56  Pulse: 71 103 70 77  Temp: 99.1 F (37.3 C) 99 F (37.2 C) 98.2 F (36.8 C) 98.5 F (36.9 C)  TempSrc: Oral Oral Oral Oral  Resp: 30 26 25 28   Height:      Weight:      SpO2: 93% 94% 95% 99%   CBG (last 3)   Recent Labs  09/30/13 0432 09/30/13 0834 09/30/13 1209  GLUCAP 110* 106* 143*    IV Fluid Intake:   . sodium chloride 45 mL/hr at 09/30/13 0149    MEDICATIONS  . amiodarone  200 mg  Oral BID  . aspirin  324 mg Oral Daily  . atorvastatin  20 mg Oral q1800  . chlorhexidine  15 mL Mouth/Throat BID  . diltiazem  30 mg Oral Q12H  . enoxaparin (LOVENOX) injection  30 mg Subcutaneous Q24H  . famotidine  20 mg Oral Daily  . influenza vac split quadrivalent PF  0.5 mL Intramuscular Tomorrow-1000  . pneumococcal 23 valent vaccine  0.5 mL Intramuscular Tomorrow-1000  . sodium chloride  10-40 mL Intracatheter Q12H   PRN:  fentaNYL, labetalol, RESOURCE THICKENUP CLEAR, senna-docusate, sodium chloride  Diet:  Dysphagia 1 pudding thick Activity:   Up in chair DVT Prophylaxis:  Lovenox 30 mg sq daily, SCDs  CLINICALLY SIGNIFICANT STUDIES Basic Metabolic Panel:   Recent Labs Lab 09/26/13 1325  09/29/13 2110 09/30/13 0430  NA 141  < > 145 146*  K 4.3  < > 2.7* 2.6*  CL 105  < > 100 105  CO2 15*  < > 34* 33*  GLUCOSE 106*  < > 159* 113*  BUN 21  < > 22 23  CREATININE 0.87  < > 0.93 0.91  CALCIUM 8.9  < > 8.3* 7.5*  MG 2.0  --   --  1.3*  PHOS 2.6  --   --  2.1*  < > = values in this interval not displayed. Liver Function Tests:   Recent Labs Lab 09/26/13 1325 09/27/13 1045  AST 109* 72*  ALT 94* 78*  ALKPHOS 90 89  BILITOT 0.5 0.4  PROT 5.8* 6.2  ALBUMIN 2.4* 2.6*   CBC:   Recent Labs Lab 09/25/13 0520 09/26/13 0530 09/28/13 0345  WBC 11.0* 11.2* 11.6*  NEUTROABS 9.3*  --  9.3*  HGB 9.0* 9.8* 10.8*  HCT 26.6* 30.1* 33.3*  MCV 92.4 93.5 95.4  PLT 275 339 356   Coagulation:   Recent Labs Lab 09/24/13 1105  LABPROT 14.3  INR 1.13   Cardiac Enzymes:   Recent Labs Lab 09/24/13 1105  TROPONINI <0.30   Urinalysis:   Recent Labs Lab 09/24/13 1143  COLORURINE YELLOW  LABSPEC 1.020  PHURINE 5.5  GLUCOSEU NEGATIVE  HGBUR NEGATIVE  BILIRUBINUR SMALL*  KETONESUR 15*  PROTEINUR NEGATIVE  UROBILINOGEN 1.0  NITRITE NEGATIVE  LEUKOCYTESUR NEGATIVE   Lipid Panel    Component Value Date/Time   CHOL 109 09/25/2013 0520   TRIG 78  09/25/2013 0520   HDL 26* 09/25/2013 0520   CHOLHDL 4.2 09/25/2013 0520   VLDL 16 09/25/2013 0520   LDLCALC 67 09/25/2013 0520   HgbA1C  Lab Results  Component Value Date   HGBA1C 6.1* 09/25/2013    Urine Drug Screen:     Component Value Date/Time   LABOPIA NONE DETECTED 09/24/2013 1143   COCAINSCRNUR NONE DETECTED 09/24/2013 1143   LABBENZ NONE DETECTED 09/24/2013 1143   AMPHETMU NONE DETECTED 09/24/2013 1143   THCU NONE DETECTED 09/24/2013 1143   LABBARB NONE DETECTED 09/24/2013 1143    Alcohol Level:   Recent Labs Lab 09/24/13 1105  ETH <11    CT of the brain  09/28/2013 The distribution of territory involved with the middle cerebral artery infarct on the right is essentially stable compared to 1 day prior. There is localized cytotoxic edema throughout much of the area of infarct with relative effacement of sulci in these areas. Note that there is edema of the head of the caudate nucleus on the right, a finding also noted 1 day prior. Most likely, the recurrent artery of Huebner on the right which normally supplies the head of the caudate nucleus arises from the middle cerebral artery in this case, an anatomic variant. No acute hemorrhage seen. No new infarct appreciated compared to 1 day prior. 09/25/2013    No intracranial hemorrhage.  Right middle cerebral artery distribution infarct is better visualize with indistinctness of the gray white junction of the right frontal operculum region and indistinctness of the right lenticular nucleus/right caudate head.    09/24/2013    Mild evolution of a right middle cerebral artery distribution acute infarct. No intracranial hemorrhage.   09/24/2013    Indistinct appearance of the right basal ganglia, right caudate, right subinsular region, right peri operculum region and portion of the right frontal lobe suggestive of a right middle cerebral artery acute infarct involving over 1/3 of the distribution of the right middle cerebral artery.  Question  minimal hyperdensity of the right carotid terminus/M1 segment right middle cerebral artery raises the possibility of thrombus at this level.  No intracranial hemorrhage currently noted.    Cerebral Angio 09/24/2013 S/P rt common carotid arteriogram followed by TICI 3 Revascularization of occluded RT MCA prox using 10.8 mg of IA tpa and 2 passes with the 4 x 40 mm Solitaire FR retrieval device.   MRI of the brain  09/27/2013 Large acute/subacute right middle cerebral artery distribution nonhemorrhagic infarct with mild local mass effect without midline shift.   MRA of the brain  See angio  2D Echocardiogram  EF 30-35% with no source of embolus.   Carotid Doppler  No evidence of hemodynamically significant internal carotid artery stenosis. Vertebral artery flow is antegrade.   CXR   09/29/2013 09/28/2013 Worsening pulmonary edema. 09/26/2013    Persistent pulmonary edema and bibasilar effusions.  Increased atelectasis versus consolidation left lower lobe.  09/24/2013    1. Status post intubation.  Tip of tube is of above the carina. 2. Moderate pulmonary edema.   EKG  normal sinus rhythm, LBBB, PAC's noted.   Therapy Recommendations CIR, SNF  Physical Exam   Mental Status:   Awake and interactive. Dysarthric speech. Can be understood Cranial Nerves:  II: left visual filed defect, pupils equal, round, reactive to light and accommodation  III,IV, VI: ptosis not present, right gaze preference.  V,VII: smile asymmetric with left face weakness VIII: hearing grossly normal bilaterally   XII: midline tongue extension without atrophy or fasciculations  Motor:  Left sided hemiplegia  Tone and bulk:normal tone throughout; no atrophy noted  Sensory: Pinprick and light touch intact throughout, bilaterally  Cerebellar: normal finger-to-nose    ASSESSMENT Dorothy [name] is a 79 y.o. female presenting with left hemiparesis and dysarthria. Status post IV t-PA 1147 at 09/24/2013 followed by  revascularization of occluded proximal R  MCA prox using IA tpa and Solitaire retrieval device. Imaging confirms a right MCA infarct. Infarct felt to be  embolic secondary to atrial fibrillation.  On aspirin 81 mg orally every day prior to admission. Now on aspirin 325 mg orally every day for secondary stroke prevention. Patient with resultant left hemiparesis, dysphagia.  Intubation for neurointervention only, extubated.  Pulmonary edema per CXR yest, CXR today pending atrial fibrillation, unknown if new or old, not on anticoagulation prior to admission, maximized IV diltiazem for rate control. Amiodarone added w/ QRS widening, now back in NSR  CHA2DS2-VASc Score for Atrial Fibrillation Stroke Risk = 7   Age in Years:   ?71 +2    Sex:   Female +1    Congestive Heart Failure History:  yes +1   Hypertension History:  Yes +1   Stroke/TIA/Thromboembolism History:  yes +2   Vascular Disease History:  no   Diabetes Mellitus: no  Hypokalemia,  K 4.1->3.1->2.4, IV's infiltrated, replacement ordered once PICC placed  Blank stare, ? Etiology, EEG ordered Hypertension, on diazide prior to admission Elevated liver enzymes, mild Anemia, acute blood loss post neurointervention and dehydration 12.2->9.8 Thyroid disease, on synthroid PTA Dehydration, Cr 1.03->0.87 Hgb 9.8  Hospital day # 6  TREATMENT/PLAN  Medically stable for discharge to [facility] Rehab when bed available  Continue aspirin 325 mg orally every day for secondary stroke prevention due to large size of stroke x1 more day then start anticoagulation (eliquis) for initiation 1 week post stroke  Eliquis 2.5 mg bid tomorrow  Kristie Cowman, MD Internal Medicine Resident PGY3  09/30/2013 3:29 PM   I have personally obtained a history, examined the patient, evaluated imaging results, and formulated the assessment and plan of care. I agree with the above. Delia Heady, MD

## 2013-09-30 NOTE — Progress Notes (Signed)
Rehab admissions - i have not been able to contact patient's husband.  I will not admit today/tomorrow.  I will wait until Monday.  I will follow up Monday am.  Call me for questions.  #[phone]

## 2013-09-30 NOTE — Progress Notes (Signed)
CRITICAL VALUE ALERT  Critical value received:  K 2.6  Date of notification:  09/30/13  Time of notification:  0545  Critical value read back:yes  Nurse who received alert:  Lesli Albee  MD notified (1st page):  PCCM  Time of first page:  0550  MD notified (2nd page):  Time of second page:  Responding MD:  PCCM  Time MD responded:  [phone]

## 2013-10-01 LAB — BASIC METABOLIC PANEL
BUN: 24 mg/dL — ABNORMAL HIGH (ref 6–23)
Chloride: 109 mEq/L (ref 96–112)
Creatinine, Ser: 0.78 mg/dL (ref 0.50–1.10)
GFR calc Af Amer: 89 mL/min — ABNORMAL LOW (ref >90)
GFR calc non Af Amer: 77 mL/min — ABNORMAL LOW (ref >90)

## 2013-10-01 LAB — GLUCOSE, CAPILLARY
Glucose-Capillary: 106 mg/dL — ABNORMAL HIGH (ref 70–99)
Glucose-Capillary: 119 mg/dL — ABNORMAL HIGH (ref 70–99)
Glucose-Capillary: 141 mg/dL — ABNORMAL HIGH (ref 70–99)

## 2013-10-01 MED ORDER — POTASSIUM CHLORIDE CRYS ER 20 MEQ PO TBCR
40.0000 meq | EXTENDED_RELEASE_TABLET | Freq: Two times a day (BID) | ORAL | Status: AC
  Administered 2013-10-01 – 2013-10-02 (×2): 40 meq via ORAL
  Filled 2013-10-01 (×2): qty 2

## 2013-10-01 MED ORDER — MAGNESIUM OXIDE 400 (241.3 MG) MG PO TABS
400.0000 mg | ORAL_TABLET | Freq: Two times a day (BID) | ORAL | Status: AC
  Administered 2013-10-01 – 2013-10-02 (×2): 400 mg via ORAL
  Filled 2013-10-01 (×2): qty 1

## 2013-10-01 MED ORDER — POTASSIUM CHLORIDE 20 MEQ PO PACK: 40.0000 meq | PACK | Freq: Two times a day (BID) | ORAL | Status: DC

## 2013-10-01 NOTE — Progress Notes (Signed)
Pt converted to Afib-100s-150s.  SBP-100s.  Dr Katrinka Blazing notified.  Will monitor pt.

## 2013-10-01 NOTE — Progress Notes (Signed)
eLink Physician-Brief Progress Note Patient Name: Dorothy [name] DOB: January 31, 1931 MRN: [id number]  Date of Service  10/01/2013   HPI/Events of Note   Patient with intermittent afib throughout the night.  She is hemodynamically stable.  eICU Interventions  No changes made.        Henry Russel, P 10/01/2013, 2:23 AM

## 2013-10-01 NOTE — Progress Notes (Addendum)
Stroke Team Progress Note  HISTORY 79 y.o. female with a past medical history significant for atrial fibrillation, brought to [facility] ED by ambulance as a code stroke. She was last known well at 9 am today 09/24/2013, talking to her husband and suddenly became less responsive, fell to the floor, and was EMS arrived shew was not able to move the left side and had dysarthria. Never had similar symptoms before. Initial NIHSS 19.  CT brain revealed questionable ill defined area of hypodensity left MCA territory as well as question minimal hyperdensity of the right carotid terminus/M1 segment right middle cerebral artery raises the possibility of thrombus at this level. She was a candidate for IV TPA and it was administered. She is alert and awake, following commands, and started improving in then ED after receiving the IV tpa bolus.  Given hyperdense right MCA and NIH stroke scale of 19, concern for large vessel embolus. Patient was taken by interventional neuroradiology for a cerebral angiogram which showed occlusion of the right MCA proximally. She was administered intra-arterial TPA he 10.8 mg and 2 passes of the solitaire retrieval device for a TICI3 revascularization. She was intubated for airway protection during the procedure. She was admitted to the neuro ICU for further evaluation and treatment.  SUBJECTIVE Husband at bedside. Patient sitting up in bed. No new events.  OBJECTIVE Most recent Vital Signs: Filed Vitals:   10/01/13 0700 10/01/13 0757 10/01/13 0900 10/01/13 1154  BP:  111/67 119/74 102/57  Pulse: 82 55    Temp:  97.8 F (36.6 C)  97.9 F (36.6 C)  TempSrc:  Oral  Oral  Resp: 24 25 25 26   Height:      Weight:      SpO2: 97% 97%     CBG (last 3)   Recent Labs  10/01/13 0010 10/01/13 0425 10/01/13 0755  GLUCAP 119* 149* 106*    IV Fluid Intake:   . sodium chloride 45 mL/hr at 10/01/13 0524    MEDICATIONS  . amiodarone  200 mg Oral BID  . aspirin  324 mg Oral Daily  .  atorvastatin  20 mg Oral q1800  . chlorhexidine  15 mL Mouth/Throat BID  . diltiazem  30 mg Oral Q12H  . enoxaparin (LOVENOX) injection  30 mg Subcutaneous Q24H  . famotidine  20 mg Oral Daily  . influenza vac split quadrivalent PF  0.5 mL Intramuscular Tomorrow-1000  . pneumococcal 23 valent vaccine  0.5 mL Intramuscular Tomorrow-1000  . sodium chloride  10-40 mL Intracatheter Q12H   PRN:  fentaNYL, labetalol, RESOURCE THICKENUP CLEAR, senna-docusate, sodium chloride  Diet:  Dysphagia 1 pudding thick Activity:   Up in chair DVT Prophylaxis:  Lovenox 30 mg sq daily, SCDs  CLINICALLY SIGNIFICANT STUDIES Basic Metabolic Panel:   Recent Labs Lab 09/26/13 1325  09/29/13 2110 09/30/13 0430  NA 141  < > 145 146*  K 4.3  < > 2.7* 2.6*  CL 105  < > 100 105  CO2 15*  < > 34* 33*  GLUCOSE 106*  < > 159* 113*  BUN 21  < > 22 23  CREATININE 0.87  < > 0.93 0.91  CALCIUM 8.9  < > 8.3* 7.5*  MG 2.0  --   --  1.3*  PHOS 2.6  --   --  2.1*  < > = values in this interval not displayed. Liver Function Tests:   Recent Labs Lab 09/26/13 1325 09/27/13 1045  AST 109* 72*  ALT 94*  78*  ALKPHOS 90 89  BILITOT 0.5 0.4  PROT 5.8* 6.2  ALBUMIN 2.4* 2.6*   CBC:   Recent Labs Lab 09/25/13 0520 09/26/13 0530 09/28/13 0345  WBC 11.0* 11.2* 11.6*  NEUTROABS 9.3*  --  9.3*  HGB 9.0* 9.8* 10.8*  HCT 26.6* 30.1* 33.3*  MCV 92.4 93.5 95.4  PLT 275 339 356   Coagulation:  No results found for this basename: LABPROT, INR,  in the last 168 hours Cardiac Enzymes:  No results found for this basename: CKTOTAL, CKMB, CKMBINDEX, TROPONINI,  in the last 168 hours Urinalysis:  No results found for this basename: COLORURINE, APPERANCEUR, LABSPEC, PHURINE, GLUCOSEU, HGBUR, BILIRUBINUR, KETONESUR, PROTEINUR, UROBILINOGEN, NITRITE, LEUKOCYTESUR,  in the last 168 hours Lipid Panel    Component Value Date/Time   CHOL 109 09/25/2013 0520   TRIG 78 09/25/2013 0520   HDL 26* 09/25/2013 0520   CHOLHDL  4.2 09/25/2013 0520   VLDL 16 09/25/2013 0520   LDLCALC 67 09/25/2013 0520   HgbA1C  Lab Results  Component Value Date   HGBA1C 6.1* 09/25/2013    Urine Drug Screen:     Component Value Date/Time   LABOPIA NONE DETECTED 09/24/2013 1143   COCAINSCRNUR NONE DETECTED 09/24/2013 1143   LABBENZ NONE DETECTED 09/24/2013 1143   AMPHETMU NONE DETECTED 09/24/2013 1143   THCU NONE DETECTED 09/24/2013 1143   LABBARB NONE DETECTED 09/24/2013 1143    Alcohol Level:  No results found for this basename: ETH,  in the last 168 hours  CT of the brain  09/28/2013 The distribution of territory involved with the middle cerebral artery infarct on the right is essentially stable compared to 1 day prior. There is localized cytotoxic edema throughout much of the area of infarct with relative effacement of sulci in these areas. Note that there is edema of the head of the caudate nucleus on the right, a finding also noted 1 day prior. Most likely, the recurrent artery of Huebner on the right which normally supplies the head of the caudate nucleus arises from the middle cerebral artery in this case, an anatomic variant. No acute hemorrhage seen. No new infarct appreciated compared to 1 day prior. 09/25/2013    No intracranial hemorrhage.  Right middle cerebral artery distribution infarct is better visualize with indistinctness of the gray white junction of the right frontal operculum region and indistinctness of the right lenticular nucleus/right caudate head.    09/24/2013    Mild evolution of a right middle cerebral artery distribution acute infarct. No intracranial hemorrhage.   09/24/2013    Indistinct appearance of the right basal ganglia, right caudate, right subinsular region, right peri operculum region and portion of the right frontal lobe suggestive of a right middle cerebral artery acute infarct involving over 1/3 of the distribution of the right middle cerebral artery.  Question minimal hyperdensity of the right  carotid terminus/M1 segment right middle cerebral artery raises the possibility of thrombus at this level.  No intracranial hemorrhage currently noted.    Cerebral Angio 09/24/2013 S/P rt common carotid arteriogram followed by TICI 3 Revascularization of occluded RT MCA prox using 10.8 mg of IA tpa and 2 passes with the 4 x 40 mm Solitaire FR retrieval device.   MRI of the brain  09/27/2013 Large acute/subacute right middle cerebral artery distribution nonhemorrhagic infarct with mild local mass effect without midline shift.   MRA of the brain  See angio  2D Echocardiogram  EF 30-35% with no source of embolus.  Carotid Doppler  No evidence of hemodynamically significant internal carotid artery stenosis. Vertebral artery flow is antegrade.   CXR   09/29/2013 09/28/2013 Worsening pulmonary edema. 09/26/2013    Persistent pulmonary edema and bibasilar effusions.  Increased atelectasis versus consolidation left lower lobe.  09/24/2013    1. Status post intubation.  Tip of tube is of above the carina. 2. Moderate pulmonary edema.   EKG  normal sinus rhythm, LBBB, PAC's noted.   Therapy Recommendations CIR, SNF  Physical Exam   Mental Status:   Awake and interactive. Dysarthric speech. Can be understood Cranial Nerves:  II: left visual filed defect, pupils equal, round, reactive to light and accommodation  III,IV, VI: ptosis not present, right gaze preference.  V,VII: smile asymmetric with left face weakness VIII: hearing grossly normal bilaterally   XII: midline tongue extension without atrophy or fasciculations  Motor: Left sided hemiparesis (0 procimal, 1-2/5 distally), with decreased tone.  Sensory: decreased on left side Cerebellar: normal finger-to-nose    ASSESSMENT [name] is a 79 y.o. female presenting with left hemiparesis and dysarthria. Status post IV t-PA 1147 at 09/24/2013 followed by revascularization of occluded proximal R  MCA prox using IA tpa and Solitaire  retrieval device. Imaging confirms a right MCA infarct. Infarct felt to be  embolic secondary to atrial fibrillation.  On aspirin 81 mg orally every day prior to admission. Now on aspirin 325 mg orally every day for secondary stroke prevention. Patient with resultant left hemiparesis, dysphagia.  Intubation for neurointervention only, extubated.  Pulmonary edema per CXR yest, CXR today pending atrial fibrillation, unknown if new or old, not on anticoagulation prior to admission, maximized IV diltiazem for rate control. Amiodarone added w/ QRS widening, now back in NSR  CHA2DS2-VASc Score for Atrial Fibrillation Stroke Risk = 7   Age in Years:   ?25 +2    Sex:   Female +1    Congestive Heart Failure History:  yes +1   Hypertension History:  Yes +1   Stroke/TIA/Thromboembolism History:  yes +2   Vascular Disease History:  no   Diabetes Mellitus: no  Hypokalemia,  K 4.1->3.1->2.4, IV's infiltrated, replacement ordered once PICC placed  Blank stare, ? Etiology, EEG ordered Hypertension, on diazide prior to admission Elevated liver enzymes, mild Anemia, acute blood loss post neurointervention and dehydration 12.2->9.8 Thyroid disease, on synthroid PTA Dehydration, Cr 1.03->0.87 Hgb 9.8  Hospital day # 7  TREATMENT/PLAN  Medically stable for discharge to Inpt Rehab when bed available  Stop aspirin today (already received today's AM dose).   Check CT on 10/02/13 AM; if stable, then start anticoagulation (eliquis 2.5mg , starting 10/02/13)  Follow up lytes (low K and low Mg) and replace as necessary  I evaluated and examined patient, reviewed records, labs and imaging.  Suanne Marker, MD 10/01/2013, 1:14 PM Certified in Neurology, Neurophysiology and Neuroimaging [facility] - Stroke Team  Please refer to [email or web address] for on-call Stroke MD

## 2013-10-02 ENCOUNTER — Inpatient Hospital Stay (HOSPITAL_COMMUNITY): Payer: Medicare Other

## 2013-10-02 LAB — GLUCOSE, CAPILLARY
Glucose-Capillary: 124 mg/dL — ABNORMAL HIGH (ref 70–99)
Glucose-Capillary: 112 mg/dL — ABNORMAL HIGH (ref 70–99)
Glucose-Capillary: 149 mg/dL — ABNORMAL HIGH (ref 70–99)
Glucose-Capillary: 94 mg/dL (ref 70–99)
Glucose-Capillary: 122 mg/dL — ABNORMAL HIGH (ref 70–99)

## 2013-10-02 MED ORDER — APIXABAN 2.5 MG PO TABS
2.5000 mg | ORAL_TABLET | Freq: Two times a day (BID) | ORAL | Status: DC
  Administered 2013-10-02 – 2013-10-04 (×4): 2.5 mg via ORAL
  Filled 2013-10-02 (×5): qty 1

## 2013-10-02 NOTE — Progress Notes (Signed)
Stroke Team Progress Note  HISTORY 79 y.o. female with a past medical history significant for atrial fibrillation, brought to [facility] ED by ambulance as a code stroke. She was last known well at 9 am today 09/24/2013, talking to her husband and suddenly became less responsive, fell to the floor, and was EMS arrived shew was not able to move the left side and had dysarthria. Never had similar symptoms before. Initial NIHSS 19.  CT brain revealed questionable ill defined area of hypodensity left MCA territory as well as question minimal hyperdensity of the right carotid terminus/M1 segment right middle cerebral artery raises the possibility of thrombus at this level. She was a candidate for IV TPA and it was administered. She is alert and awake, following commands, and started improving in then ED after receiving the IV tpa bolus.  Given hyperdense right MCA and NIH stroke scale of 19, concern for large vessel embolus. Patient was taken by interventional neuroradiology for a cerebral angiogram which showed occlusion of the right MCA proximally. She was administered intra-arterial TPA he 10.8 mg and 2 passes of the solitaire retrieval device for a TICI3 revascularization. She was intubated for airway protection during the procedure. She was admitted to the neuro ICU for further evaluation and treatment.  SUBJECTIVE Husband at bedside. Patient sitting up in bed. No new events. Doing well.  OBJECTIVE Most recent Vital Signs: Filed Vitals:   10/01/13 2200 10/02/13 0000 10/02/13 0421 10/02/13 0800  BP: 126/85 114/56 130/69   Pulse: 70 76 80   Temp:   98.1 F (36.7 C) 97.9 F (36.6 C)  TempSrc:   Oral Oral  Resp: 25 19 22    Height:      Weight:   103 lb 13.4 oz (47.1 kg)   SpO2: 94% 93% 91%    CBG (last 3)   Recent Labs  10/02/13 0420 10/02/13 0807 10/02/13 1150  GLUCAP 124* 112* 149*    IV Fluid Intake:   . sodium chloride 45 mL/hr at 10/01/13 0524    MEDICATIONS  . amiodarone  200 mg Oral  BID  . atorvastatin  20 mg Oral q1800  . chlorhexidine  15 mL Mouth/Throat BID  . diltiazem  30 mg Oral Q12H  . enoxaparin (LOVENOX) injection  30 mg Subcutaneous Q24H  . famotidine  20 mg Oral Daily  . influenza vac split quadrivalent PF  0.5 mL Intramuscular Tomorrow-1000  . pneumococcal 23 valent vaccine  0.5 mL Intramuscular Tomorrow-1000  . sodium chloride  10-40 mL Intracatheter Q12H   PRN:  fentaNYL, labetalol, RESOURCE THICKENUP CLEAR, senna-docusate, sodium chloride  Diet:  Dysphagia 1 pudding thick Activity:   Up in chair DVT Prophylaxis:  Lovenox 30 mg sq daily, SCDs  CLINICALLY SIGNIFICANT STUDIES Basic Metabolic Panel:   Recent Labs Lab 09/26/13 1325  09/30/13 0430 10/01/13 1255  NA 141  < > 146* 146*  K 4.3  < > 2.6* 3.4*  CL 105  < > 105 109  CO2 15*  < > 33* 28  GLUCOSE 106*  < > 113* 154*  BUN 21  < > 23 24*  CREATININE 0.87  < > 0.91 0.78  CALCIUM 8.9  < > 7.5* 8.1*  MG 2.0  --  1.3* 1.5  PHOS 2.6  --  2.1*  --   < > = values in this interval not displayed. Liver Function Tests:   Recent Labs Lab 09/26/13 1325 09/27/13 1045  AST 109* 72*  ALT 94* 78*  ALKPHOS  90 89  BILITOT 0.5 0.4  PROT 5.8* 6.2  ALBUMIN 2.4* 2.6*   CBC:   Recent Labs Lab 09/26/13 0530 09/28/13 0345  WBC 11.2* 11.6*  NEUTROABS  --  9.3*  HGB 9.8* 10.8*  HCT 30.1* 33.3*  MCV 93.5 95.4  PLT 339 356   Coagulation:  No results found for this basename: LABPROT, INR,  in the last 168 hours Cardiac Enzymes:  No results found for this basename: CKTOTAL, CKMB, CKMBINDEX, TROPONINI,  in the last 168 hours Urinalysis:  No results found for this basename: COLORURINE, APPERANCEUR, LABSPEC, PHURINE, GLUCOSEU, HGBUR, BILIRUBINUR, KETONESUR, PROTEINUR, UROBILINOGEN, NITRITE, LEUKOCYTESUR,  in the last 168 hours Lipid Panel    Component Value Date/Time   CHOL 109 09/25/2013 0520   TRIG 78 09/25/2013 0520   HDL 26* 09/25/2013 0520   CHOLHDL 4.2 09/25/2013 0520   VLDL 16 09/25/2013  0520   LDLCALC 67 09/25/2013 0520   HgbA1C  Lab Results  Component Value Date   HGBA1C 6.1* 09/25/2013    Urine Drug Screen:     Component Value Date/Time   LABOPIA NONE DETECTED 09/24/2013 1143   COCAINSCRNUR NONE DETECTED 09/24/2013 1143   LABBENZ NONE DETECTED 09/24/2013 1143   AMPHETMU NONE DETECTED 09/24/2013 1143   THCU NONE DETECTED 09/24/2013 1143   LABBARB NONE DETECTED 09/24/2013 1143    Alcohol Level:  No results found for this basename: ETH,  in the last 168 hours  CT of the brain  09/28/2013 The distribution of territory involved with the middle cerebral artery infarct on the right is essentially stable compared to 1 day prior. There is localized cytotoxic edema throughout much of the area of infarct with relative effacement of sulci in these areas. Note that there is edema of the head of the caudate nucleus on the right, a finding also noted 1 day prior. Most likely, the recurrent artery of Huebner on the right which normally supplies the head of the caudate nucleus arises from the middle cerebral artery in this case, an anatomic variant. No acute hemorrhage seen. No new infarct appreciated compared to 1 day prior. 09/25/2013    No intracranial hemorrhage.  Right middle cerebral artery distribution infarct is [name] visualize with indistinctness of the gray white junction of the right frontal operculum region and indistinctness of the right lenticular nucleus/right caudate head.    09/24/2013    Mild evolution of a right middle cerebral artery distribution acute infarct. No intracranial hemorrhage.   09/24/2013    Indistinct appearance of the right basal ganglia, right caudate, right subinsular region, right peri operculum region and portion of the right frontal lobe suggestive of a right middle cerebral artery acute infarct involving over 1/3 of the distribution of the right middle cerebral artery.  Question minimal hyperdensity of the right carotid terminus/M1 segment right middle  cerebral artery raises the possibility of thrombus at this level.  No intracranial hemorrhage currently noted.    Cerebral Angio 09/24/2013 S/P rt common carotid arteriogram followed by TICI 3 Revascularization of occluded RT MCA prox using 10.8 mg of IA tpa and 2 passes with the 4 x 40 mm Solitaire FR retrieval device.   MRI of the brain  09/27/2013 Large acute/subacute right middle cerebral artery distribution nonhemorrhagic infarct with mild local mass effect without midline shift.   MRA of the brain  See angio  2D Echocardiogram  EF 30-35% with no source of embolus.   Carotid Doppler  No evidence of hemodynamically significant internal carotid  artery stenosis. Vertebral artery flow is antegrade.   CXR   09/29/2013 09/28/2013 Worsening pulmonary edema. 09/26/2013    Persistent pulmonary edema and bibasilar effusions.  Increased atelectasis versus consolidation left lower lobe.  09/24/2013    1. Status post intubation.  Tip of tube is of above the carina. 2. Moderate pulmonary edema.   EKG  normal sinus rhythm, LBBB, PAC's noted.   Therapy Recommendations CIR, SNF  Physical Exam   Mental Status:   Awake and interactive. Dysarthric speech. Can be understood Cranial Nerves:  II: left visual filed defect, pupils equal, round, reactive to light III,IV, VI: ptosis not present, right gaze preference.  V,VII: smile asymmetric with left face weakness VIII: hearing grossly normal bilaterally   XII: midline tongue extension without atrophy or fasciculations  Motor: Left sided hemiparesis (0 proximal, 1-2/5 distally), with decreased tone.  Sensory: decreased on left side    ASSESSMENT [name] is a 79 y.o. female presenting with left hemiparesis and dysarthria. Status post IV t-PA 1147 at 09/24/2013 followed by revascularization of occluded proximal R  MCA prox using IA tpa and Solitaire retrieval device. Imaging confirms a right MCA infarct. Infarct felt to be  embolic secondary to  atrial fibrillation.  On aspirin 81 mg orally every day prior to admission. Now on aspirin 325 mg orally every day for secondary stroke prevention. Patient with resultant left hemiparesis, dysphagia.  Intubation for neurointervention only, extubated.  Pulmonary edema per CXR yest, CXR today pending atrial fibrillation, unknown if new or old, not on anticoagulation prior to admission, maximized IV diltiazem for rate control. Amiodarone added w/ QRS widening, now back in NSR  CHA2DS2-VASc Score for Atrial Fibrillation Stroke Risk = 7   Age in Years:   ?46 +2    Sex:   Female +1    Congestive Heart Failure History:  yes +1   Hypertension History:  Yes +1   Stroke/TIA/Thromboembolism History:  yes +2   Vascular Disease History:  no   Diabetes Mellitus: no  Hypokalemia,  K 4.1->3.1->2.4, IV's infiltrated, replacement ordered once PICC placed  Blank stare, ? Etiology, EEG ordered Hypertension, on diazide prior to admission Elevated liver enzymes, mild Anemia, acute blood loss post neurointervention and dehydration 12.2->9.8 Thyroid disease, on synthroid PTA Dehydration, Cr 1.03->0.87 Hgb 9.8  Hospital day # 8  TREATMENT/PLAN  Medically stable for discharge to [facility] when bed available  Start anticoagulation (eliquis 2.5mg , starting 10/02/13)  Follow up lytes (low K and low Mg) and replace as necessary  I evaluated and examined patient, reviewed records, labs and imaging.  Suanne Marker, MD 10/02/2013, 2:40 PM Certified in Neurology, Neurophysiology and Neuroimaging [facility] - Stroke Team  Please refer to [email or web address] for on-call Stroke MD

## 2013-10-03 DIAGNOSIS — E079 Disorder of thyroid, unspecified: Secondary | ICD-10-CM | POA: Diagnosis present

## 2013-10-03 DIAGNOSIS — I4891 Unspecified atrial fibrillation: Secondary | ICD-10-CM | POA: Diagnosis present

## 2013-10-03 DIAGNOSIS — I10 Essential (primary) hypertension: Secondary | ICD-10-CM | POA: Diagnosis present

## 2013-10-03 DIAGNOSIS — R1314 Dysphagia, pharyngoesophageal phase: Secondary | ICD-10-CM | POA: Diagnosis present

## 2013-10-03 LAB — BASIC METABOLIC PANEL
BUN: 23 mg/dL (ref 6–23)
CO2: 24 mEq/L (ref 19–32)
Chloride: 112 mEq/L (ref 96–112)
Creatinine, Ser: 0.86 mg/dL (ref 0.50–1.10)
GFR calc Af Amer: 72 mL/min — ABNORMAL LOW (ref >90)
GFR calc non Af Amer: 62 mL/min — ABNORMAL LOW (ref >90)
Potassium: 4.2 mEq/L (ref 3.5–5.1)
Sodium: 146 mEq/L — ABNORMAL HIGH (ref 135–145)

## 2013-10-03 LAB — GLUCOSE, CAPILLARY: Glucose-Capillary: 124 mg/dL — ABNORMAL HIGH (ref 70–99)

## 2013-10-03 MED ORDER — RESOURCE THICKENUP CLEAR PO POWD: 1.0000 | ORAL | Status: DC | PRN

## 2013-10-03 MED ORDER — APIXABAN 2.5 MG PO TABS: 2.5000 mg | ORAL_TABLET | Freq: Two times a day (BID) | ORAL | Status: DC

## 2013-10-03 MED ORDER — ATORVASTATIN CALCIUM 20 MG PO TABS: 20.0000 mg | ORAL_TABLET | Freq: Every day | ORAL | Status: DC

## 2013-10-03 MED ORDER — DILTIAZEM HCL 30 MG PO TABS
30.0000 mg | ORAL_TABLET | Freq: Four times a day (QID) | ORAL | Status: DC
  Administered 2013-10-03: 30 mg via ORAL
  Filled 2013-10-03 (×3): qty 1

## 2013-10-03 MED ORDER — METOPROLOL TARTRATE 1 MG/ML IV SOLN
2.5000 mg | INTRAVENOUS | Status: DC | PRN
  Administered 2013-10-03: 2.5 mg via INTRAVENOUS
  Filled 2013-10-03: qty 5

## 2013-10-03 MED ORDER — DILTIAZEM HCL 30 MG PO TABS: 30.0000 mg | ORAL_TABLET | Freq: Two times a day (BID) | ORAL | Status: DC

## 2013-10-03 MED ORDER — AMIODARONE HCL 200 MG PO TABS: 200.0000 mg | ORAL_TABLET | Freq: Two times a day (BID) | ORAL | Status: DC

## 2013-10-03 MED ORDER — FAMOTIDINE 20 MG PO TABS: 20.0000 mg | ORAL_TABLET | Freq: Every day | ORAL | Status: DC

## 2013-10-03 NOTE — Progress Notes (Signed)
Stroke Team Progress Note  HISTORY 79 y.o. female with a past medical history significant for atrial fibrillation, brought to [facility] ED by ambulance as a code stroke. She was last known well at 9 am today 09/24/2013, talking to her husband and suddenly became less responsive, fell to the floor, and was EMS arrived shew was not able to move the left side and had dysarthria. Never had similar symptoms before. Initial NIHSS 19.  CT brain revealed questionable ill defined area of hypodensity left MCA territory as well as question minimal hyperdensity of the right carotid terminus/M1 segment right middle cerebral artery raises the possibility of thrombus at this level. She was a candidate for IV TPA and it was administered. She is alert and awake, following commands, and started improving in then ED after receiving the IV tpa bolus.  Given hyperdense right MCA and NIH stroke scale of 19, concern for large vessel embolus. Patient was taken by interventional neuroradiology for a cerebral angiogram which showed occlusion of the right MCA proximally. She was administered intra-arterial TPA he 10.8 mg and 2 passes of the solitaire retrieval device for a TICI3 revascularization. She was intubated for airway protection during the procedure. She was admitted to the neuro ICU for further evaluation and treatment.  SUBJECTIVE Pt lying in bed. Just had a "spell" per ex and RN after he fed her some thickened water - severe cough.  OBJECTIVE Most recent Vital Signs: Filed Vitals:   10/03/13 0833 10/03/13 0906 10/03/13 1000 10/03/13 1034  BP: 141/92 141/92    Pulse: 86  89 74  Temp: 98.6 F (37 C)     TempSrc: Oral     Resp: 31  42 28  Height:      Weight:      SpO2: 91%  91% 100%   CBG (last 3)   Recent Labs  10/02/13 2322 10/03/13 0425 10/03/13 0833  GLUCAP 157* 124* 108*    IV Fluid Intake:   . sodium chloride 45 mL/hr at 10/01/13 0524    MEDICATIONS  . amiodarone  200 mg Oral BID  . apixaban  2.5  mg Oral BID  . atorvastatin  20 mg Oral q1800  . chlorhexidine  15 mL Mouth/Throat BID  . diltiazem  30 mg Oral Q12H  . famotidine  20 mg Oral Daily  . influenza vac split quadrivalent PF  0.5 mL Intramuscular Tomorrow-1000  . pneumococcal 23 valent vaccine  0.5 mL Intramuscular Tomorrow-1000  . sodium chloride  10-40 mL Intracatheter Q12H   PRN:  fentaNYL, labetalol, RESOURCE THICKENUP CLEAR, senna-docusate, sodium chloride  Diet:  Dysphagia 1 pudding thick Activity:   Up in chair DVT Prophylaxis:  aspixaban  CLINICALLY SIGNIFICANT STUDIES Basic Metabolic Panel:   Recent Labs Lab 09/26/13 1325  09/30/13 0430 10/01/13 1255 10/03/13 0358  NA 141  < > 146* 146* 146*  K 4.3  < > 2.6* 3.4* 4.2  CL 105  < > 105 109 112  CO2 15*  < > 33* 28 24  GLUCOSE 106*  < > 113* 154* 119*  BUN 21  < > 23 24* 23  CREATININE 0.87  < > 0.91 0.78 0.86  CALCIUM 8.9  < > 7.5* 8.1* 8.6  MG 2.0  --  1.3* 1.5  --   PHOS 2.6  --  2.1*  --   --   < > = values in this interval not displayed. Liver Function Tests:   Recent Labs Lab 09/26/13 1325 09/27/13 1045  AST 109* 72*  ALT 94* 78*  ALKPHOS 90 89  BILITOT 0.5 0.4  PROT 5.8* 6.2  ALBUMIN 2.4* 2.6*   CBC:   Recent Labs Lab 09/28/13 0345  WBC 11.6*  NEUTROABS 9.3*  HGB 10.8*  HCT 33.3*  MCV 95.4  PLT 356   Coagulation:  No results found for this basename: LABPROT, INR,  in the last 168 hours Cardiac Enzymes:  No results found for this basename: CKTOTAL, CKMB, CKMBINDEX, TROPONINI,  in the last 168 hours Urinalysis:  No results found for this basename: COLORURINE, APPERANCEUR, LABSPEC, PHURINE, GLUCOSEU, HGBUR, BILIRUBINUR, KETONESUR, PROTEINUR, UROBILINOGEN, NITRITE, LEUKOCYTESUR,  in the last 168 hours Lipid Panel    Component Value Date/Time   CHOL 109 09/25/2013 0520   TRIG 78 09/25/2013 0520   HDL 26* 09/25/2013 0520   CHOLHDL 4.2 09/25/2013 0520   VLDL 16 09/25/2013 0520   LDLCALC 67 09/25/2013 0520   HgbA1C  Lab Results   Component Value Date   HGBA1C 6.1* 09/25/2013    Urine Drug Screen:     Component Value Date/Time   LABOPIA NONE DETECTED 09/24/2013 1143   COCAINSCRNUR NONE DETECTED 09/24/2013 1143   LABBENZ NONE DETECTED 09/24/2013 1143   AMPHETMU NONE DETECTED 09/24/2013 1143   THCU NONE DETECTED 09/24/2013 1143   LABBARB NONE DETECTED 09/24/2013 1143    Alcohol Level:  No results found for this basename: ETH,  in the last 168 hours  CT of the brain  09/28/2013 The distribution of territory involved with the middle cerebral artery infarct on the right is essentially stable compared to 1 day prior. There is localized cytotoxic edema throughout much of the area of infarct with relative effacement of sulci in these areas. Note that there is edema of the head of the caudate nucleus on the right, a finding also noted 1 day prior. Most likely, the recurrent artery of Huebner on the right which normally supplies the head of the caudate nucleus arises from the middle cerebral artery in this case, an anatomic variant. No acute hemorrhage seen. No new infarct appreciated compared to 1 day prior. 09/25/2013    No intracranial hemorrhage.  Right middle cerebral artery distribution infarct is better visualize with indistinctness of the gray white junction of the right frontal operculum region and indistinctness of the right lenticular nucleus/right caudate head.    09/24/2013    Mild evolution of a right middle cerebral artery distribution acute infarct. No intracranial hemorrhage.   09/24/2013    Indistinct appearance of the right basal ganglia, right caudate, right subinsular region, right peri operculum region and portion of the right frontal lobe suggestive of a right middle cerebral artery acute infarct involving over 1/3 of the distribution of the right middle cerebral artery.  Question minimal hyperdensity of the right carotid terminus/M1 segment right middle cerebral artery raises the possibility of thrombus at this  level.  No intracranial hemorrhage currently noted.    Cerebral Angio 09/24/2013 S/P rt common carotid arteriogram followed by TICI 3 Revascularization of occluded RT MCA prox using 10.8 mg of IA tpa and 2 passes with the 4 x 40 mm Solitaire FR retrieval device.   MRI of the brain  09/27/2013 Large acute/subacute right middle cerebral artery distribution nonhemorrhagic infarct with mild local mass effect without midline shift.   MRA of the brain  See angio  2D Echocardiogram  EF 30-35% with no source of embolus.   Carotid Doppler  No evidence of hemodynamically significant internal carotid artery  stenosis. Vertebral artery flow is antegrade.   CXR   09/30/2013 No significant change in the bilateral airspace disease or edema. Probable bilateral pleural effusions. 09/29/2013 Left subclavian central venous line with distal tip in the superior vena cava. There is no pneumothorax. Severe pulmonary edema. Consolidation of both lung bases with bilateral pleural effusions. 09/28/2013 Worsening pulmonary edema. 09/26/2013    Persistent pulmonary edema and bibasilar effusions.  Increased atelectasis versus consolidation left lower lobe.  09/24/2013    1. Status post intubation.  Tip of tube is of above the carina. 2. Moderate pulmonary edema.   EKG  normal sinus rhythm, LBBB, PAC's noted.   EEG abnormal with moderately severe nonspecific continuous generalized slowing of cerebral activity. This pattern is nonspecific and can be seen with degenerative as well as metabolic encephalopathies. No evidence of an epileptic disorder is demonstrated.  Therapy Recommendations CIR, SNF  Physical Exam   Mental Status:   Drowsy but awakens and  interactive. Dysarthric speech. Can be understood Cranial Nerves:  II: left visual filed defect, pupils equal, round, reactive to light III,IV, VI: ptosis not present, right gaze preference.  V,VII: smile asymmetric with left face weakness VIII: hearing grossly normal  bilaterally   XII: midline tongue extension without atrophy or fasciculations  Motor: Left sided hemiparesis (0 proximal, 1-2/5 distally), with decreased tone.  Sensory: decreased on left side    ASSESSMENT Dorothy [name] is a 79 y.o. female presenting with left hemiparesis and dysarthria. Status post IV t-PA 1147 at 09/24/2013 followed by revascularization of occluded proximal R  MCA prox using IA tpa and Solitaire retrieval device. Imaging confirms a right MCA infarct. Infarct felt to be  embolic secondary to atrial fibrillation.  On aspirin 81 mg orally every day prior to admission. Now on aspirin 325 mg orally every day for secondary stroke prevention. Patient with resultant left hemiparesis, dysphagia.  Severe dysphagia, at high risk for aspiration given D1 pudding diet and cough episode after feeding this am. Intubation for neurointervention only, extubated.  Pulmonary edema, resolving per CXR atrial fibrillation, unknown if new or old, not on anticoagulation prior to admission, maximized IV diltiazem for rate control. Amiodarone added w/ QRS widening, now back in NSR  CHA2DS2-VASc Score for Atrial Fibrillation Stroke Risk = 7   Age in Years:   ?68 +2    Sex:   Female +1    Congestive Heart Failure History:  yes +1   Hypertension History:  Yes +1   Stroke/TIA/Thromboembolism History:  yes +2   Vascular Disease History:  no   Diabetes Mellitus: no  Hypokalemia,  Resolved 12/14  Blank stare,  EEG without seizure Hypertension, on diazide prior to admission Elevated liver enzymes, mild Anemia, acute blood loss post neurointervention and dehydration 12.2->9.8->10.8 Thyroid disease, on synthroid PTA Dehydration, Cr 1.03->0.87 Hgb 9.8  Patient's ex-husband is at the bedside. States they live together and he would like for her to go to a SNF as that is what she would want. States he is her healthcare POA and has the papers to prove it at home. He would also like her to be a DNR. He  does not believe she would want her nephew involved in her decision making. He is going to bring the papers with him tomorrow (lives in [place] and too far to go today).  Hospital day # 9 2 TREATMENT/PLAN  Ex husband no longer wants CIR. Plan SNF bed at discharge. SW searching for bed  Continue eliquis 2.5mg  for  secondary stroke prevention  Follow up ex-husbands POA tomorrow  Will place DNR order tomorrow once [facility] POA is verified  Follow up lytes (low K and low Mg) and replace as necessary  CXR in am  Annie Main, MSN, RN, ANVP-BC, ANP-BC, GNP-BC [facility] Pager: [phone] 10/03/2013 10:35 AM  I have personally obtained a history, examined the patient, evaluated imaging results, and formulated the assessment and plan of care. I agree with the above. Delia Heady, MD

## 2013-10-03 NOTE — Progress Notes (Signed)
Chaplain reviewed HCPOA with patient and family. Advance Directive was completed. Three copies were made. One copy was given to the secretary to be placed in the patient's chart. The original and two copies were given to the patient.   10/03/13 1500  Clinical Encounter Type  Visited With Patient and family together  Visit Type Initial;Other (Comment)  Referral From Social work Engineer, building services)

## 2013-10-03 NOTE — Progress Notes (Addendum)
CSW paged NP informing her pt has bed at [facility]. CSW checking to ensure FL2 is signed on chart. CSW will facilitate discharge to [facility] once discharge summary is complete. CSW called [facility] and left a voicemail informing them CSW will upload discharge summary when it is complete, and that pt is coming to facility today. CSW also providing pt's husband with HCPOA paperwork and living will forms.  Addendum: CSW provided husband with HCPOA and living will paperwork. Husband's POA was in the room and they asked if he could be a witness on the forms--CSW explained that he cannot, and explained who can be a witness. Family asked if they could get assistance with the notary and witnesses, and CSW paged spiritual care. Chaplain to come help with this paperwork.   Maryclare Labrador, MSW, [facility] Clinical Social Worker [phone]

## 2013-10-03 NOTE — Progress Notes (Addendum)
Clinical Social Work Department CLINICAL SOCIAL WORK PLACEMENT NOTE 10/03/2013  Patient:  Mcnee,Iolanda  Account Number:  [id number] Admit date:  09/24/2013  Clinical Social Worker:  Maryclare Labrador, Theresia Majors  Date/time:  10/03/2013 01:28 PM  Clinical Social Work is seeking post-discharge placement for this patient at the following level of care:   SKILLED NURSING   (*CSW will update this form in Epic as items are completed)   N/A-pt requested clinicals sent to [place], [place], and [facility] provided with [facility] Department of Clinical Social Work's list of facilities offering this level of care within the geographic area requested by the patient (or if unable, by the patient's family).  10/03/2013  Patient/family informed of their freedom to choose among providers that offer the needed level of care, that participate in Medicare, Medicaid or managed care program needed by the patient, have an available bed and are willing to accept the patient.  N/A-clinicals not sent to this facility  Patient/family informed of [facility]' ownership interest in [facility], as well as of the fact that they are under no obligation to receive care at this facility.  PASARR submitted to EDS on 10/03/2013 PASARR number received from EDS on 10/03/2013  FL2 transmitted to all facilities in geographic area requested by pt/family on  10/03/2013 FL2 transmitted to all facilities within larger geographic area on   Patient informed that his/her managed care company has contracts with or will negotiate with  certain facilities, including the following:     Patient/family informed of bed offers received:  10/03/2013 Patient chooses bed at [facility] Physician recommends and patient chooses bed at    Patient to be transferred to [facility] on  10/04/2013 Patient to be transferred to facility by [facility]  The following physician request were entered in  Epic:   Additional Comments:    Maryclare Labrador, MSW, [facility] Clinical Social Worker [phone]

## 2013-10-03 NOTE — Progress Notes (Signed)
OT Cancellation Note  Patient Details Name: Chantea Surace MRN: [id number] DOB: 05/11/1931   Cancelled Treatment:    Reason Eval/Treat Not Completed: OT screened.  Pt was scheduled to transfer to SNF this pm so therefore, OT eval held with plan to defer eval to SNF; however, note that discharge was held due to pt with increased HR.  Will check back tomorrow and initiate eval if it looks like pt will remain in acute longer than expected.   Jeani Hawking M [phone] 10/03/2013, 4:19 PM

## 2013-10-03 NOTE — Progress Notes (Signed)
Speech Language Pathology Treatment: Dysphagia  Patient Details Name: Dorothy [name] MRN: [id number] DOB: 01-10-1931 Today's Date: 10/03/2013 Time: 1640-     Assessment / Plan / Recommendation Clinical Impression  Pt. Sleepy.  RN reports pt's husband gave pt. Water and pt. Began having respiratory distress.  Husband is not present for further education re: MBS results and recommendations.  Pt. Is a silent aspirator with nectar thick liquids, requiring Honey thick by spoon with use of chin tuck and multiple swallows.  Aspiration precautions were written and posted at [facility].  RN states she asked husband to only allow hospital staff to give po's.  SLP will return for further education with husband prior to d/c to SNF.   HPI HPI: 79 yo female with atrial fibrillation who presented with left hemiparesis and dysarthria, now status post cerebral angiogram with revascularization of occluded right MCA.   Pertinent Vitals CXR pending; afebrile; lung sounds diminished.  SLP Plan  Continue with current plan of care    Recommendations Diet recommendations: Dysphagia 1 (puree);Honey-thick liquid Liquids provided via: Teaspoon Medication Administration: Crushed with puree Supervision: Staff to assist with self feeding;Full supervision/cueing for compensatory strategies Compensations: Slow rate;Small sips/bites;Multiple dry swallows after each bite/sip;Effortful swallow Postural Changes and/or Swallow Maneuvers: Seated upright 90 degrees;Chin tuck              Plan: Continue with current plan of care    GO     Maryjo Rochester T 10/03/2013, 4:49 PM

## 2013-10-03 NOTE — Progress Notes (Signed)
Per RN, POA papers have been signed. Ex-husband is POA. He requested DNR this am. Order placed.  RN reports HR down to 70s after metoprolol. Also reports HR down to 30s when they lay her down. Will monitor HR. Hold cardizem additional doses should HR remain low.   Annie Main, MSN, RN, ANVP-BC, ANP-BC, [facility] Pager: [phone] 10/03/2013 4:33 PM  I have personally obtained a history, examined the patient, evaluated imaging results, and formulated the assessment and plan of care. I agree with the above. Delia Heady, MD

## 2013-10-03 NOTE — Progress Notes (Signed)
Pt alert, in the process of signing her living will ,family at bedside,notary public at beside heart started to go up at 130-150's A. fib , pt was able to put her on the chair, MD was paged.

## 2013-10-03 NOTE — Discharge Summary (Addendum)
Stroke Discharge Summary  Patient ID: Dorothy [name]   MRN: [id number]      DOB: 27-Feb-1931  Date of Admission: 09/24/2013 Date of Discharge: 10/04/2013  Attending Physician:  Darcella Cheshire, MD, Stroke MD  Consulting Physician(s):   Treatment Team:  Md Stroke, MD  Germain Osgood, MD (critical care medicine), Faith Rogue, MD (Physical Medicine & Rehabtilitation)  Patient's PCP:  No primary provider on file.  Discharge Diagnoses:  Principal Problem:   Cerebral infarction due to embolism of right middle cerebral artery, s/p tPa with clot retrieval using solitaire device Active Problems:   Acute respiratory failure with hypoxia   Atrial fibrillation   Dysphagia, pharyngoesophageal phase   High blood pressure   Thyroid disease   Tachycardia/Bradycardia  BMI: Body mass index is 17.24 kg/(m^2).  Past Medical History  Diagnosis Date  . Stroke   . Atrial fibrillation    Past Surgical History  Procedure Laterality Date  . Radiology with anesthesia N/A 09/24/2013    Procedure: RADIOLOGY WITH ANESTHESIA;  Surgeon: Oneal Grout, MD;  Location: [facility] OR;  Service: Radiology;  Laterality: N/A;      Medication List    STOP taking these medications       aspirin EC 81 MG tablet     Biotin 1000 MCG tablet     digoxin 0.125 MG tablet  Commonly known as:  LANOXIN     pentoxifylline 400 MG CR tablet  Commonly known as:  TRENTAL     [facility] Caps     traMADol 50 MG tablet  Commonly known as:  ULTRAM     triamterene-hydrochlorothiazide 37.5-25 MG per capsule  Commonly known as:  DYAZIDE      TAKE these medications       amiodarone 200 MG tablet  Commonly known as:  PACERONE  Take 1 tablet (200 mg total) by mouth 2 (two) times daily.     apixaban 2.5 MG Tabs tablet  Commonly known as:  ELIQUIS  Take 1 tablet (2.5 mg total) by mouth 2 (two) times daily.     atorvastatin 20 MG tablet  Commonly known as:  LIPITOR  Take 1 tablet (20 mg total) by  mouth daily at 6 PM.     CALTRATE 600 PLUS-VIT D PO  Take 1 tablet by mouth daily.     CENTRUM SILVER ULTRA WOMENS Tabs  Take 1 tablet by mouth daily.     cholecalciferol 1000 UNITS tablet  Commonly known as:  VITAMIN D  Take 1,000 Units by mouth daily.     diltiazem 30 MG tablet  Commonly known as:  CARDIZEM  Take 1 tablet (30 mg total) by mouth every 12 (twelve) hours.     famotidine 20 MG tablet  Commonly known as:  PEPCID  Take 1 tablet (20 mg total) by mouth daily.     levothyroxine 75 MCG tablet  Commonly known as:  SYNTHROID, LEVOTHROID  Take 75 mcg by mouth daily before breakfast.     OSTEO BI-FLEX TRIPLE STRENGTH PO  Take 1 tablet by mouth daily.     RESOURCE THICKENUP CLEAR Powd  Take 120 g by mouth as needed (thicken liquids per order).        LABORATORY STUDIES CBC    Component Value Date/Time   WBC 11.6* 09/28/2013 0345   RBC 3.49* 09/28/2013 0345   HGB 10.8* 09/28/2013 0345   HCT 33.3* 09/28/2013 0345   PLT 356 09/28/2013 0345   MCV  95.4 09/28/2013 0345   MCH 30.9 09/28/2013 0345   MCHC 32.4 09/28/2013 0345   RDW 16.0* 09/28/2013 0345   LYMPHSABS 1.2 09/28/2013 0345   MONOABS 1.0 09/28/2013 0345   EOSABS 0.0 09/28/2013 0345   BASOSABS 0.0 09/28/2013 0345   CMP    Component Value Date/Time   NA 146* 10/03/2013 0358   K 4.2 10/03/2013 0358   CL 112 10/03/2013 0358   CO2 24 10/03/2013 0358   GLUCOSE 119* 10/03/2013 0358   BUN 23 10/03/2013 0358   CREATININE 0.86 10/03/2013 0358   CALCIUM 8.6 10/03/2013 0358   PROT 6.2 09/27/2013 1045   ALBUMIN 2.6* 09/27/2013 1045   AST 72* 09/27/2013 1045   ALT 78* 09/27/2013 1045   ALKPHOS 89 09/27/2013 1045   BILITOT 0.4 09/27/2013 1045   GFRNONAA 62* 10/03/2013 0358   GFRAA 72* 10/03/2013 0358   COAGS Lab Results  Component Value Date   INR 1.13 09/24/2013   Lipid Panel    Component Value Date/Time   CHOL 109 09/25/2013 0520   TRIG 78 09/25/2013 0520   HDL 26* 09/25/2013 0520   CHOLHDL 4.2  09/25/2013 0520   VLDL 16 09/25/2013 0520   LDLCALC 67 09/25/2013 0520   HgbA1C  Lab Results  Component Value Date   HGBA1C 6.1* 09/25/2013   Cardiac Panel (last 3 results) No results found for this basename: CKTOTAL, CKMB, TROPONINI, RELINDX,  in the last 72 hours Urinalysis    Component Value Date/Time   COLORURINE YELLOW 09/24/2013 1143   APPEARANCEUR CLEAR 09/24/2013 1143   LABSPEC 1.020 09/24/2013 1143   PHURINE 5.5 09/24/2013 1143   GLUCOSEU NEGATIVE 09/24/2013 1143   HGBUR NEGATIVE 09/24/2013 1143   BILIRUBINUR SMALL* 09/24/2013 1143   KETONESUR 15* 09/24/2013 1143   PROTEINUR NEGATIVE 09/24/2013 1143   UROBILINOGEN 1.0 09/24/2013 1143   NITRITE NEGATIVE 09/24/2013 1143   LEUKOCYTESUR NEGATIVE 09/24/2013 1143   Urine Drug Screen     Component Value Date/Time   LABOPIA NONE DETECTED 09/24/2013 1143   COCAINSCRNUR NONE DETECTED 09/24/2013 1143   LABBENZ NONE DETECTED 09/24/2013 1143   AMPHETMU NONE DETECTED 09/24/2013 1143   THCU NONE DETECTED 09/24/2013 1143   LABBARB NONE DETECTED 09/24/2013 1143    Alcohol Level    Component Value Date/Time   ETH <11 09/24/2013 1105     SIGNIFICANT DIAGNOSTIC STUDIES CT of the brain  09/28/2013 The distribution of territory involved with the middle cerebral artery infarct on the right is essentially stable compared to 1 day prior. There is localized cytotoxic edema throughout much of the area of infarct with relative effacement of sulci in these areas. Note that there is edema of the head of the caudate nucleus on the right, a finding also noted 1 day prior. Most likely, the recurrent artery of [place] on the right which normally supplies the head of the caudate nucleus arises from the middle cerebral artery in this case, an anatomic variant. No acute hemorrhage seen. No new infarct appreciated compared to 1 day prior.  09/25/2013 No intracranial hemorrhage. Right middle cerebral artery distribution infarct is better visualize with indistinctness of the  gray white junction of the right frontal operculum region and indistinctness of the right lenticular nucleus/right caudate head.  09/24/2013 Mild evolution of a right middle cerebral artery distribution acute infarct. No intracranial hemorrhage.  09/24/2013 Indistinct appearance of the right basal ganglia, right caudate, right subinsular region, right peri operculum region and portion of the right frontal lobe suggestive of  a right middle cerebral artery acute infarct involving over 1/3 of the distribution of the right middle cerebral artery. Question minimal hyperdensity of the right carotid terminus/M1 segment right middle cerebral artery raises the possibility of thrombus at this level. No intracranial hemorrhage currently noted.  Cerebral Angio 09/24/2013 S/P rt common carotid arteriogram followed by TICI 3 Revascularization of occluded RT MCA prox using 10.8 mg of IA tpa and 2 passes with the 4 x 40 mm Solitaire FR retrieval device.  MRI of the brain 09/27/2013 Large acute/subacute right middle cerebral artery distribution nonhemorrhagic infarct with mild local mass effect without midline shift.  MRA of the brain See angio  2D Echocardiogram EF 30-35% with no source of embolus.  Carotid Doppler No evidence of hemodynamically significant internal carotid artery stenosis. Vertebral artery flow is antegrade.  CXR  09/30/2013 No significant change in the bilateral airspace disease or edema. Probable bilateral pleural effusions.  09/29/2013 Left subclavian central venous line with distal tip in the superior vena cava. There is no pneumothorax. Severe pulmonary edema. Consolidation of both lung bases with bilateral pleural effusions.  09/28/2013 Worsening pulmonary edema.  09/26/2013 Persistent pulmonary edema and bibasilar effusions. Increased atelectasis versus consolidation left lower lobe.  09/24/2013 1. Status post intubation. Tip of tube is of above the carina. 2. Moderate pulmonary edema.  EKG normal  sinus rhythm, LBBB, PAC's noted.  EEG abnormal with moderately severe nonspecific continuous generalized slowing of cerebral activity. This pattern is nonspecific and can be seen with degenerative as well as metabolic encephalopathies. No evidence of an epileptic disorder is demonstrated.      History of Present Illness   79 y.o. female with a past medical history significant for atrial fibrillation, brought to [facility] ED by ambulance as a code stroke. She was last known well at 9 am 09/24/2013, talking to her ex husband when she suddenly became less responsive, fell to the floor, and was EMS arrived shew was not able to move the left side and had dysarthria. Never had similar symptoms before. Initial NIHSS 19. CT brain revealed questionable ill defined area of hypodensity left MCA territory as well as question minimal hyperdensity of the right carotid terminus/M1 segment right middle cerebral artery raises the possibility of thrombus at this level. She was a candidate for IV TPA and it was administered. She is alert and awake, following commands, and started improving in then ED after receiving the IV tpa bolus. Given hyperdense right MCA and NIH stroke scale of 19, concern for large vessel embolus. Patient was taken by interventional neuroradiology for a cerebral angiogram which showed occlusion of the right MCA proximally. She was administered intra-arterial TPA he 10.8 mg and 2 passes of the solitaire retrieval device for a TICI3 revascularization. She was intubated for airway protection during the procedure. She was admitted to the neuro ICU for further evaluation and treatment.   Hospital Course She was extubated within 48 h, though respiratory status was tenuous. CXR revealed pulmonary edema which was felt to be due to /afib with RVR and fluid overload given EF of 35%.  It was resolving per CXR.   Imaging confirms a right MCA infarct. Infarct felt to be embolic secondary to atrial fibrillation. She was on  aspirin 81 mg orally every day prior to admission. CHA2DS2-VASc Score for Atrial Fibrillation Stroke Risk = 7. She was changed to eliquis in the hospital for for secondary stroke prevention.  In hospital, pt also with  Severe dysphagia secondary to stroke, at  high risk for aspiration given D1 pudding diet. Aspiration precautions needs to be taken when feeding her Hypokalemia, replaced and resolved  Blank stare, EEG without seizure Elevated liver enzymes, mild  Anemia, acute blood loss post neurointervention and dehydration 12.2->9.8->10.8, should continue to improve  Thyroid disease, on synthroid PTA, this was resumed at discharge Dehydration, Cr 1.03->0.87, resolved   Patient with vascular risk factors of:   atrial fibrillation, unknown if new or old, not on anticoagulation prior to admission, treated with IV diltiazem and IV Amiodarone for rate control. Changed to po medications. She has some QRS widening on Amiodarone. She progressively had alternating bradycardia and tachycardia. She is at risk of developing a fatal rhythm. Ex husband confirmed that she had said that she would not want aggressive interventions such as transvenous pacing, CPR. Will continue the medications and stop tele monitoring. Pt was made a DNR this admission.   Hypertension, on diazide prior to admission. This was stopped in hospital due to variable fluid status. BP within normal range now. Need to follow as an OP and add antihypertensives as appropriate.  Patient with continued stroke symptoms of  left hemiparesis, dysphagia. Physical therapy, occupational therapy and speech therapy evaluated patient. They recommend CIR. Plans initially were for CIR, however, Ex husband and she agree SNF would be better for the family and arrangements were made for discharge there.   Discharge Exam  Blood pressure 109/94, pulse 131, temperature 97.9 F (36.6 C), temperature source Oral, resp. rate 29, height 5\' 5"  (1.651 m), weight 47  kg (103 lb 9.9 oz), SpO2 100.00%.  Mental Status:  Awake and interactive. Dysarthric speech. Can be understood  Cranial Nerves:  II: left visual filed defect, pupils equal, round, reactive to light  III,IV, VI: ptosis not present, right gaze preference.  V,VII: smile asymmetric with left face weakness  VIII: hearing grossly normal bilaterally  XII: midline tongue extension without atrophy or fasciculations  Motor: Left sided hemiparesis (0 proximal, 1-2/5 distally), with decreased tone.  Sensory: decreased on left side   Discharge Diet   Dysphagia 1 pudding thick liquids  Discharge Plan    Disposition:  Discharge to skilled nursing facility for ongoing PT, OT and ST.    Eliquis for secondary stroke prevention.  Ongoing risk factor control by Primary Care Physician.  Follow-up MD at SNF.  Follow-up with Dr. Delia Heady, Stroke Clinic in 2 months.  45 minutes were spent preparing discharge.  Signed Annie Main, AVNP, ANP-BC, [facility] Nurse Practitioner 10/04/2013, 9:55 AM  I have personally examined this patient, reviewed pertinent data and developed the plan of care. I agree with above.  Delia Heady, MD

## 2013-10-03 NOTE — Progress Notes (Addendum)
Clinical Social Work Department BRIEF PSYCHOSOCIAL ASSESSMENT 10/03/2013  Patient:  Dorothy [name],Dorothy [name]     Account Number:  [id number]     Admit date:  09/24/2013  Clinical Social Worker:  Varney Biles  Date/Time:  10/03/2013 01:16 PM  Referred by:  Physician  Date Referred:  10/03/2013 Referred for  SNF Placement   Other Referral:   Interview type:  Patient Other interview type:   CSW also spoke with pt's husband over the phone.    PSYCHOSOCIAL DATA Living Status:  FAMILY Admitted from facility:   Level of care:   Primary support name:  Dorothy [name] [phone] & [phone]) Primary support relationship to patient:  SPOUSE Degree of support available:   Good--pt living with husband Dorothy [name] before hospital admission, per pt.    CURRENT CONCERNS Current Concerns  Post-Acute Placement   Other Concerns:    SOCIAL WORK ASSESSMENT / PLAN CSW asked pt where she was living before coming to the hospital, and she responded [place]. CSW asked if she was living with her husband, and pt nodded to confirm. CSW explained that CSW was informed by CIR representative that [name] is the first choice SNF for pt, as this is closer to their home in [place] and it is hard for her husband to drive to and from the hospital consistently. Pt fell asleep during this portion of conversation, and did not rouse when CSW said pt name and gently touched her arm. CSW called pt's husband, Dorothy [name]. Dorothy [name] confirmed that [name] is the first choice, and CSW asked if she could send clinicals to the other SNFs in [place]. Dorothy [name] gave CSW permission to send to [facility] and [facility]. CSW called [facility] and left a voicemail requesting a return call letting CSW know if they can take pt. [facility] able to offer bed, and [facility] confirmed on carefinderpro that they can take pt. CSW called RNCM and informed her of bed offer from family's first choice SNF. CSW paged NP to inform NP that pt has bed at  [facility] and can go today if medically ready for discharge. Pt's ex-husband in room and requesting living will and HCPOA paperwork. CSW bringing him this now and will inform him of bed offer at [facility].  Addendum: Per pt's family friend who was in room during assessment, pt and Dorothy [name] were married then divorced, and though they are not currently married they do live together and Dorothy [name] provides support to pt. James had HCPOA paperwork, but when he went home to find it he realized a portion of the document was missing, per family friend. Spiritual care in room now helping Dorothy [name] and pt fill out HCPOA paperwork and living will forms.   Assessment/plan status:  Psychosocial Support/Ongoing Assessment of Needs Other assessment/ plan:   Information/referral to community resources:   SNF [facility]).    PATIENT'S/FAMILY'S RESPONSE TO PLAN OF CARE: Pt receptive to CSW assessment but was tired and fell asleep during assessment. Pt's ex- husband receptive to CSW phone call and appreciative of CSW assistance.       Dorothy [name], Dorothy [name], [facility] Clinical Social Worker [phone]

## 2013-10-03 NOTE — Progress Notes (Signed)
Rehab admissions - I met with patient and her "husband".  They would like [facility] in [place] rather that inpatient rehab.  I have informed the case manager and the social worker of desire for SNF.  Call me for questions.  #[phone]

## 2013-10-03 NOTE — Progress Notes (Signed)
HR 150s. Exacerbated with activity. Will give metoprolol 2.5 mg IV acutely and increase cardizem to 30 mg qid and hold discharge today.  Annie Main, MSN, RN, ANVP-BC, ANP-BC, GNP-BC [facility] Pager: [phone] 10/03/2013 4:03 PM  I have personally   formulated the assessment and plan of care. I agree with the above. Delia Heady, MD

## 2013-10-03 NOTE — Progress Notes (Addendum)
CSW notified by CIR rep that pt is now requesting to go to [facility] for rehab, as family lives in [place] and prefers to move pt closer to home for rehab. CSW submitted clinicals to facility and will complete full assessment and explain SNF search.  Addendum: CSW spoke with pt, who fell asleep during assessment. CSW called pt's husband and explained SNF search--husband first choice is [facility], as this is close to their home. CSW got permission from husband to send her information to [facility] and [facility] as well; CSW has done this. Full assessment and placement note to follow.  Maryclare Labrador, MSW, [facility] Clinical Social Worker [phone]

## 2013-10-03 NOTE — Progress Notes (Signed)
Physical Therapy Treatment Patient Details Name: Dorothy [name] MRN: [id number] DOB: [birth year] Today's Date: 10/03/2013 Time: 1443-1510 PT Time Calculation (min): 27 min  PT Assessment / Plan / Recommendation  History of Present Illness Pt found to have R MCA infarct   PT Comments   Pt admitted with above. Pt currently with functional limitations due to balance and endurance deficits.  Pt will benefit from skilled PT to increase their independence and safety with mobility to allow discharge to the venue listed below.   Follow Up Recommendations  SNF;Supervision/Assistance - 24 hour                 Equipment Recommendations  Other (comment) (TBA)        Frequency Min 4X/week   Progress towards PT Goals Progress towards PT goals: Progressing toward goals  Plan Current plan remains appropriate    Precautions / Restrictions Precautions Precautions: Fall Precaution Comments: L inattention Restrictions Weight Bearing Restrictions: No   Pertinent Vitals/Pain HR 125 bpm afib on arrival.  HR 135-162 once transferred. Notified nursing who notified MD.      Mobility  Bed Mobility Bed Mobility: Supine to Sit;Sitting - Scoot to Edge of Bed;Sit to Supine Rolling Right: Not tested (comment) Rolling Left: Not tested (comment) Supine to Sit: 2: Max assist Sitting - Scoot to Edge of Bed: 3: Mod assist Details for Bed Mobility Assistance: Verbal and tactile cues for mobility. Incr assist for trunk elevation as pt able to move LEs with some incr assist to move her left LE.  Able to assist with scooting to EOB.  Transfers Transfers: Sit to Stand;Stand to Sit Sit to Stand: 1: +2 Total assist;With upper extremity assist;From bed Sit to Stand: Patient Percentage: 50% Stand to Sit: 1: +2 Total assist;With upper extremity assist;With armrests;To chair/3-in-1 Stand to Sit: Patient Percentage: 50% Stand Pivot Transfers: 1: +2 Total assist Stand Pivot Transfers: Patient Percentage:  60% Details for Transfer Assistance: Verbal cues for hand placement.   Once standing, patient required assist for stability as she had posterior lean as well as decr hip, trunk and knee extension with flexed posture overall.  Needed incr assist and cues to take pivotal steps to chair with PT assisting with weight shifting.    Verbal cues to hold head up.  Pt asked for drink and cranberry juice thickened in room.  Noted no swallow restrictions posted and asked ST to place sign up over bed and nursing did so.   Ambulation/Gait Ambulation/Gait Assistance: Not tested (comment) Stairs: No Wheelchair Mobility Wheelchair Mobility: No Modified Rankin (Stroke Patients Only) Pre-Morbid Rankin Score: No symptoms Modified Rankin: Moderately severe disability    Exercises General Exercises - Lower Extremity Ankle Circles/Pumps: AROM;Both;10 reps;Seated Quad Sets: AROM;Both;10 reps;Supine Long Arc Quad: AROM;Both;10 reps;Seated Heel Slides: AROM;Both;10 reps;Seated   PT Goals (current goals can now be found in the care plan section)    Visit Information  Last PT Received On: 10/03/13 Assistance Needed: +2 History of Present Illness: Pt found to have R MCA infarct    Subjective Data  Subjective: Pt much more tired today.   Cognition  Cognition Arousal/Alertness: Lethargic Behavior During Therapy: Flat affect Overall Cognitive Status: Impaired/Different from baseline Area of Impairment: Orientation;Attention;Memory;Following commands;Safety/judgement;Awareness;Problem solving Orientation Level: Disoriented to;Place;Time Current Attention Level: Sustained Memory: Decreased recall of precautions;Decreased short-term memory Following Commands: Follows one step commands consistently Safety/Judgement: Decreased awareness of safety;Decreased awareness of deficits Awareness: Intellectual Problem Solving: Slow processing;Decreased initiation;Difficulty sequencing;Requires verbal cues;Requires tactile  cues General Comments: Difficulty  arousing initially.  Once upright, more alert.  Question if not sleeping at night.    Balance  Static Sitting Balance Static Sitting - Balance Support: Feet supported;Right upper extremity supported Static Sitting - Level of Assistance: 5: Stand by assistance Static Sitting - Comment/# of Minutes: Pt continues with lateral lean to right.  Requires max tactile cues for midline Static Standing Balance Static Standing - Balance Support: Bilateral upper extremity supported;During functional activity Static Standing - Level of Assistance: 3: Mod assist Static Standing - Comment/# of Minutes: 2 min to be cleaned of BM with posterior lean and left lateral lean.    End of Session PT - End of Session Equipment Utilized During Treatment: Gait belt;Oxygen Activity Tolerance: Patient limited by fatigue Patient left: in chair;with call bell/phone within reach Nurse Communication: Mobility status       INGOLD,Lucrecia Mcphearson 10/03/2013, 4:31 PM [facility] [phone] (pager)

## 2013-10-03 NOTE — Progress Notes (Addendum)
CSW paged NP and will inform her that FL2 has not been signed. CSW will fax document to MD office, MD can come sign form, or CSW will meet him in the hospital to have it signed.  Addendum: CSW faxed FL2 to MD's office and called MD office to alert Andrey Campanile [phone]), RN, that document has been faxed. MD notified by NP that [facility] is being faxed to office [phone]). CSW awaiting return fax with signed FL2.  Addendum: CSW received notification from fax machine that FL2 was successfully faxed to MD office.  Addendum: CSW has not received fax with signed FL2. CSW called NP and alerted her that CSW called MD office and left voicemail for triage pool, as Andrey Campanile is out of the office. NP sending MD another text to see if he can sign FL2 and fax it to unit.  Per RN, heart rate has increased and RN is going to page the MD.   Maryclare Labrador, MSW, [facility] Clinical Social Worker [phone]

## 2013-10-04 ENCOUNTER — Inpatient Hospital Stay (HOSPITAL_COMMUNITY): Payer: Medicare Other

## 2013-10-04 ENCOUNTER — Encounter: Payer: Self-pay | Admitting: Internal Medicine

## 2013-10-04 LAB — GLUCOSE, CAPILLARY
Glucose-Capillary: 123 mg/dL — ABNORMAL HIGH (ref 70–99)
Glucose-Capillary: 125 mg/dL — ABNORMAL HIGH (ref 70–99)
Glucose-Capillary: 132 mg/dL — ABNORMAL HIGH (ref 70–99)

## 2013-10-04 MED ORDER — DILTIAZEM HCL 30 MG PO TABS
30.0000 mg | ORAL_TABLET | Freq: Two times a day (BID) | ORAL | Status: DC
  Administered 2013-10-04: 30 mg via ORAL
  Filled 2013-10-04 (×2): qty 1

## 2013-10-04 MED ORDER — DILTIAZEM HCL 30 MG PO TABS: 30.0000 mg | ORAL_TABLET | Freq: Four times a day (QID) | ORAL | Status: DC

## 2013-10-04 MED ORDER — AMIODARONE HCL 200 MG PO TABS
200.0000 mg | ORAL_TABLET | Freq: Two times a day (BID) | ORAL | Status: DC
  Administered 2013-10-04: 200 mg via ORAL
  Filled 2013-10-04 (×2): qty 1

## 2013-10-04 NOTE — Progress Notes (Signed)
CSW resubmitted clinicals and the discharge summary for pt to [facility]. CSW left admissions rep a voicemail explaining CSW has done this and is wondering if facility can take her today. CSW waiting to hear from facility and continues to routinely check online for a response from SNF. If facility can take pt, CSW will facilitate discharge to [facility].   Maryclare Labrador, MSW, [facility] Clinical Social Worker [phone]

## 2013-10-04 NOTE — Progress Notes (Signed)
Speech Language Pathology Treatment: Dysphagia  Patient Details Name: Denetta Fei MRN: [id number] DOB: 24-Feb-1931 Today's Date: 10/04/2013 Time: 7829-5621 SLP Time Calculation (min): 13 min  Assessment / Plan / Recommendation Clinical Impression  Pt for likely D/C today to SNF.  Sleeping upon arrival.  Spouse present; he had limited understanding of pt's dysphagia and the swallowing precautions, diet restrictions.  Reviewed nature of his wife's dysphagia, her high aspiration risk, strict swallowing precautions and necessary diet modifications to maximize safety and minimize risk.  Spouse able to repeat instructions but still appeared to be unclear about the precautions.  Encouraged him to take precaution sign with him and f/u with SLP at facility.     HPI HPI: 79 yo female with atrial fibrillation who presented with left hemiparesis and dysarthria, now status post cerebral angiogram with revascularization of occluded right MCA.      SLP Plan  Discharge SLP treatment due to (comment)    Recommendations Diet recommendations: Dysphagia 1 (puree);Honey-thick liquid Liquids provided via: Teaspoon Medication Administration: Crushed with puree Supervision: Staff to assist with self feeding;Full supervision/cueing for compensatory strategies Compensations: Slow rate;Small sips/bites;Multiple dry swallows after each bite/sip;Effortful swallow Postural Changes and/or Swallow Maneuvers: Seated upright 90 degrees;Chin tuck              Plan: Discharge SLP treatment due to (comment)D/C to SNF    Chuong Casebeer L. Samson Frederic, [place] CCC/SLP Pager [phone]      Blenda Mounts Laurice 10/04/2013, 12:57 PM

## 2013-10-04 NOTE — Progress Notes (Signed)
Iv team in room to d/c PICC line.

## 2013-10-04 NOTE — Progress Notes (Signed)
Report called to Kendra.

## 2013-10-04 NOTE — Progress Notes (Signed)
OT Cancellation Note  Patient Details Name: Mikenna Bunkley MRN: [id number] DOB: 1931-03-27   Cancelled Treatment:    Reason Eval/Treat Not Completed: OT screened, no needs identified, will sign off - pt transferring to SNF today.  Will defer OT eval to SNF   Jarrette Dehner M 10/04/2013, 1:26 PM

## 2013-10-04 NOTE — Progress Notes (Signed)
Transferred to [facility] in [place], [place]

## 2013-10-04 NOTE — Progress Notes (Signed)
PIC line being taken out of pt; RN called CSW to ask if CSW can cancel PTAR. CSW cancelled PTAR and called back to push transportation to 2pm. CSW has done this; PTAR scheduled for 2:00.   Maryclare Labrador, MSW, [facility] Clinical Social Worker [phone]

## 2013-10-04 NOTE — Progress Notes (Signed)
Patient having intermittent periods of tachycardia, sometimes appearing as wide complex but usually narrow complex,0 alternating with periods of bradycardia with some pauses. Patient made DNR with this hospitalization.  I discussed with her husband the concerning beats and pauses and that she could be in danger of developing a fatal rhythm. He confirmed that she had said that she would not want aggressive interventions such as transvenous pacing, CPR. I curb-sided cardiology and at this time will hold nodal blocking agents and allow some permissive tachycardia given the length of some of her pauses(up to 7 seconds).  1) D/C metoprolol, amiodarone, and diltiazem. May need to restart if tachycardia becomes more severe.   Ritta Slot, MD [facility] [phone]  If 7pm- 7am, please page neurology on call at [phone].

## 2013-10-04 NOTE — Progress Notes (Signed)
Discharge packet complete and provided to RN. RN calling report now. CSW arranged for transportation (PTAR). CSW called family Purva Vessell, significant other) and left him voicemail explaining pt to be discharging to [facility]	[place] today. CSW signing off.   Maryclare Labrador, MSW, [facility] Clinical Social Worker [phone]

## 2013-10-14 ENCOUNTER — Inpatient Hospital Stay: Payer: Self-pay | Admitting: Internal Medicine

## 2013-10-14 DIAGNOSIS — I4891 Unspecified atrial fibrillation: Secondary | ICD-10-CM

## 2013-10-14 DIAGNOSIS — I519 Heart disease, unspecified: Secondary | ICD-10-CM

## 2013-10-14 DIAGNOSIS — I959 Hypotension, unspecified: Secondary | ICD-10-CM

## 2013-10-14 LAB — BASIC METABOLIC PANEL
Anion Gap: 6 — ABNORMAL LOW (ref 7–16)
BUN: 9 mg/dL (ref 7–18)
Calcium, Total: 8.5 mg/dL (ref 8.5–10.1)
Chloride: 102 mmol/L (ref 98–107)
Co2: 33 mmol/L — ABNORMAL HIGH (ref 21–32)
Creatinine: 1.11 mg/dL (ref 0.60–1.30)
Osmolality: 280 (ref 275–301)
Potassium: 2.3 mmol/L — CL (ref 3.5–5.1)
Sodium: 141 mmol/L (ref 136–145)

## 2013-10-14 LAB — TROPONIN I
Troponin-I: 0.09 ng/mL — ABNORMAL HIGH
Troponin-I: 0.12 ng/mL — ABNORMAL HIGH

## 2013-10-14 LAB — CBC
HGB: 12.3 g/dL (ref 12.0–16.0)
MCH: 32 pg (ref 26.0–34.0)
MCV: 94 fL (ref 80–100)
Platelet: 238 10*3/uL (ref 150–440)
RBC: 3.85 10*6/uL (ref 3.80–5.20)
WBC: 6.4 10*3/uL (ref 3.6–11.0)

## 2013-10-14 LAB — HEPATIC FUNCTION PANEL A (ARMC)
Albumin: 2.7 g/dL — ABNORMAL LOW (ref 3.4–5.0)
Bilirubin,Total: 0.6 mg/dL (ref 0.2–1.0)

## 2013-10-14 LAB — CK TOTAL AND CKMB (NOT AT ARMC)
CK, Total: 37 U/L (ref 21–215)
CK-MB: 2.1 ng/mL (ref 0.5–3.6)

## 2013-10-15 LAB — CBC WITH DIFFERENTIAL/PLATELET
Eosinophil #: 0 10*3/uL (ref 0.0–0.7)
Eosinophil %: 0 %
Lymphocyte #: 1.5 10*3/uL (ref 1.0–3.6)
Lymphocyte %: 26.6 %
MCH: 31.9 pg (ref 26.0–34.0)
MCHC: 33.8 g/dL (ref 32.0–36.0)
MCV: 94 fL (ref 80–100)
Monocyte #: 0.5 x10 3/mm (ref 0.2–0.9)
Monocyte %: 8.3 %
Neutrophil #: 3.6 10*3/uL (ref 1.4–6.5)
Platelet: 188 10*3/uL (ref 150–440)
RBC: 3.63 10*6/uL — ABNORMAL LOW (ref 3.80–5.20)
RDW: 16.1 % — ABNORMAL HIGH (ref 11.5–14.5)
WBC: 5.6 10*3/uL (ref 3.6–11.0)

## 2013-10-15 LAB — BASIC METABOLIC PANEL
BUN: 8 mg/dL (ref 7–18)
Calcium, Total: 8.1 mg/dL — ABNORMAL LOW (ref 8.5–10.1)
Glucose: 90 mg/dL (ref 65–99)
Osmolality: 285 (ref 275–301)
Potassium: 3.9 mmol/L (ref 3.5–5.1)

## 2013-10-15 LAB — LIPID PANEL
Cholesterol: 131 mg/dL (ref 0–200)
Ldl Cholesterol, Calc: 31 mg/dL (ref 0–100)
VLDL Cholesterol, Calc: 16 mg/dL (ref 5–40)

## 2013-10-15 LAB — MAGNESIUM: Magnesium: 1.8 mg/dL

## 2013-10-20 ENCOUNTER — Encounter: Payer: Self-pay | Admitting: Internal Medicine

## 2013-10-21 LAB — URINALYSIS, COMPLETE
BILIRUBIN, UR: NEGATIVE
Blood: NEGATIVE
GLUCOSE, UR: NEGATIVE mg/dL (ref 0–75)
KETONE: NEGATIVE
Nitrite: NEGATIVE
PH: 8 (ref 4.5–8.0)
Protein: 30
Specific Gravity: 1.011 (ref 1.003–1.030)
WBC UR: 311 /HPF (ref 0–5)

## 2013-10-21 LAB — CBC WITH DIFFERENTIAL/PLATELET
BASOS ABS: 0 10*3/uL (ref 0.0–0.1)
BASOS PCT: 0.3 %
Eosinophil #: 0 10*3/uL (ref 0.0–0.7)
Eosinophil %: 0.4 %
HCT: 33.4 % — ABNORMAL LOW (ref 35.0–47.0)
HGB: 11.2 g/dL — ABNORMAL LOW (ref 12.0–16.0)
Lymphocyte #: 2.1 10*3/uL (ref 1.0–3.6)
Lymphocyte %: 29.7 %
MCH: 31.8 pg (ref 26.0–34.0)
MCHC: 33.6 g/dL (ref 32.0–36.0)
MCV: 95 fL (ref 80–100)
MONOS PCT: 7.5 %
Monocyte #: 0.5 x10 3/mm (ref 0.2–0.9)
NEUTROS ABS: 4.4 10*3/uL (ref 1.4–6.5)
Neutrophil %: 62.1 %
PLATELETS: 191 10*3/uL (ref 150–440)
RBC: 3.53 10*6/uL — AB (ref 3.80–5.20)
RDW: 16.9 % — ABNORMAL HIGH (ref 11.5–14.5)
WBC: 7 10*3/uL (ref 3.6–11.0)

## 2013-10-23 LAB — URINE CULTURE

## 2013-10-29 LAB — URINALYSIS, COMPLETE
Bacteria: NONE SEEN
Bilirubin,UR: NEGATIVE
GLUCOSE, UR: NEGATIVE mg/dL (ref 0–75)
Ketone: NEGATIVE
Nitrite: NEGATIVE
PH: 6 (ref 4.5–8.0)
Protein: 100
Specific Gravity: 1.024 (ref 1.003–1.030)
Squamous Epithelial: NONE SEEN

## 2013-10-30 ENCOUNTER — Encounter (HOSPITAL_COMMUNITY): Payer: Self-pay | Admitting: Emergency Medicine

## 2013-10-30 ENCOUNTER — Emergency Department (HOSPITAL_COMMUNITY): Payer: Medicare Other

## 2013-10-30 ENCOUNTER — Inpatient Hospital Stay (HOSPITAL_COMMUNITY)
Admission: EM | Admit: 2013-10-30 | Discharge: 2013-11-02 | DRG: 243 | Disposition: A | Discharge status: Skilled Nursing Facility | Payer: Medicare Other | Attending: Family Medicine | Admitting: Family Medicine

## 2013-10-30 DIAGNOSIS — N289 Disorder of kidney and ureter, unspecified: Secondary | ICD-10-CM

## 2013-10-30 DIAGNOSIS — I2789 Other specified pulmonary heart diseases: Secondary | ICD-10-CM | POA: Diagnosis present

## 2013-10-30 DIAGNOSIS — R531 Weakness: Secondary | ICD-10-CM | POA: Diagnosis present

## 2013-10-30 DIAGNOSIS — E079 Disorder of thyroid, unspecified: Secondary | ICD-10-CM

## 2013-10-30 DIAGNOSIS — I079 Rheumatic tricuspid valve disease, unspecified: Secondary | ICD-10-CM | POA: Diagnosis present

## 2013-10-30 DIAGNOSIS — Z7901 Long term (current) use of anticoagulants: Secondary | ICD-10-CM

## 2013-10-30 DIAGNOSIS — Z79899 Other long term (current) drug therapy: Secondary | ICD-10-CM

## 2013-10-30 DIAGNOSIS — I6340 Cerebral infarction due to embolism of unspecified cerebral artery: Secondary | ICD-10-CM

## 2013-10-30 DIAGNOSIS — I272 Pulmonary hypertension, unspecified: Secondary | ICD-10-CM | POA: Diagnosis present

## 2013-10-30 DIAGNOSIS — Z66 Do not resuscitate: Secondary | ICD-10-CM | POA: Diagnosis present

## 2013-10-30 DIAGNOSIS — I214 Non-ST elevation (NSTEMI) myocardial infarction: Secondary | ICD-10-CM | POA: Diagnosis present

## 2013-10-30 DIAGNOSIS — I4891 Unspecified atrial fibrillation: Secondary | ICD-10-CM | POA: Diagnosis present

## 2013-10-30 DIAGNOSIS — I495 Sick sinus syndrome: Secondary | ICD-10-CM

## 2013-10-30 DIAGNOSIS — I428 Other cardiomyopathies: Secondary | ICD-10-CM | POA: Diagnosis present

## 2013-10-30 DIAGNOSIS — R1312 Dysphagia, oropharyngeal phase: Secondary | ICD-10-CM | POA: Diagnosis present

## 2013-10-30 DIAGNOSIS — I63411 Cerebral infarction due to embolism of right middle cerebral artery: Secondary | ICD-10-CM

## 2013-10-30 DIAGNOSIS — I10 Essential (primary) hypertension: Secondary | ICD-10-CM

## 2013-10-30 DIAGNOSIS — I69959 Hemiplegia and hemiparesis following unspecified cerebrovascular disease affecting unspecified side: Secondary | ICD-10-CM

## 2013-10-30 DIAGNOSIS — E039 Hypothyroidism, unspecified: Secondary | ICD-10-CM | POA: Diagnosis present

## 2013-10-30 DIAGNOSIS — I509 Heart failure, unspecified: Secondary | ICD-10-CM | POA: Diagnosis present

## 2013-10-30 DIAGNOSIS — I959 Hypotension, unspecified: Secondary | ICD-10-CM | POA: Diagnosis present

## 2013-10-30 DIAGNOSIS — D649 Anemia, unspecified: Secondary | ICD-10-CM | POA: Diagnosis present

## 2013-10-30 DIAGNOSIS — I5022 Chronic systolic (congestive) heart failure: Secondary | ICD-10-CM | POA: Diagnosis present

## 2013-10-30 DIAGNOSIS — I5020 Unspecified systolic (congestive) heart failure: Secondary | ICD-10-CM | POA: Diagnosis present

## 2013-10-30 HISTORY — DX: Sick sinus syndrome: I49.5

## 2013-10-30 HISTORY — DX: Non-ST elevation (NSTEMI) myocardial infarction: I21.4

## 2013-10-30 LAB — DIFFERENTIAL
Basophils Absolute: 0 10*3/uL (ref 0.0–0.1)
Basophils Relative: 0 % (ref 0–1)
Eosinophils Absolute: 0 10*3/uL (ref 0.0–0.7)
Eosinophils Relative: 0 % (ref 0–5)
Lymphocytes Relative: 23 % (ref 12–46)
Lymphs Abs: 1.8 10*3/uL (ref 0.7–4.0)
Monocytes Absolute: 0.5 10*3/uL (ref 0.1–1.0)
Monocytes Relative: 6 % (ref 3–12)
Neutro Abs: 5.7 10*3/uL (ref 1.7–7.7)
Neutrophils Relative %: 71 % (ref 43–77)

## 2013-10-30 LAB — POCT I-STAT, CHEM 8
BUN: 27 mg/dL — AB (ref 6–23)
CALCIUM ION: 1.23 mmol/L (ref 1.13–1.30)
Chloride: 106 mEq/L (ref 96–112)
Creatinine, Ser: 1.2 mg/dL — ABNORMAL HIGH (ref 0.50–1.10)
Glucose, Bld: 117 mg/dL — ABNORMAL HIGH (ref 70–99)
HCT: 28 % — ABNORMAL LOW (ref 36.0–46.0)
Hemoglobin: 9.5 g/dL — ABNORMAL LOW (ref 12.0–15.0)
Potassium: 4.2 mEq/L (ref 3.7–5.3)
Sodium: 139 mEq/L (ref 137–147)
TCO2: 20 mmol/L (ref 0–100)

## 2013-10-30 LAB — PROTIME-INR
INR: 1.36 (ref 0.00–1.49)
Prothrombin Time: 16.4 seconds — ABNORMAL HIGH (ref 11.6–15.2)

## 2013-10-30 LAB — URINE CULTURE

## 2013-10-30 LAB — POCT I-STAT TROPONIN I: Troponin i, poc: 0.02 ng/mL (ref 0.00–0.08)

## 2013-10-30 LAB — GLUCOSE, CAPILLARY
GLUCOSE-CAPILLARY: 113 mg/dL — AB (ref 70–99)
Glucose-Capillary: 63 mg/dL — ABNORMAL LOW (ref 70–99)

## 2013-10-30 LAB — CBC
HCT: 28.2 % — ABNORMAL LOW (ref 36.0–46.0)
Hemoglobin: 9.2 g/dL — ABNORMAL LOW (ref 12.0–15.0)
MCH: 31.4 pg (ref 26.0–34.0)
MCHC: 32.6 g/dL (ref 30.0–36.0)
MCV: 96.2 fL (ref 78.0–100.0)
Platelets: 261 10*3/uL (ref 150–400)
RBC: 2.93 MIL/uL — ABNORMAL LOW (ref 3.87–5.11)
RDW: 16.9 % — ABNORMAL HIGH (ref 11.5–15.5)
WBC: 8.1 10*3/uL (ref 4.0–10.5)

## 2013-10-30 LAB — APTT: aPTT: 34 seconds (ref 24–37)

## 2013-10-30 LAB — ETHANOL

## 2013-10-30 MED ORDER — SODIUM CHLORIDE 0.9 % IV BOLUS (SEPSIS)
1000.0000 mL | Freq: Once | INTRAVENOUS | Status: AC
  Administered 2013-10-30: 1000 mL via INTRAVENOUS

## 2013-10-30 MED ORDER — DEXTROSE 50 % IV SOLN
INTRAVENOUS | Status: AC
  Filled 2013-10-30: qty 50

## 2013-10-30 NOTE — ED Notes (Signed)
Warm blanket given. Family at [facility]. Dr. Preston Fleeting at [facility].

## 2013-10-30 NOTE — Consult Note (Signed)
Neurology Consultation Reason for Consult: Concern for stroke Referring Physician: Preston Fleeting, D.  CC: Lightheadedness  History is obtained from: Patient, EMS, [facility]  HPI: Dorothy [name] is a 80 y.o. female with a history of previous MCA stroke last month who is in nursing home receiving rehabilitation and has had remarkable progress in improvement in her left-sided weakness. She has progressed to the point where she has become ambulatory recently. She was walking to the bathroom tonight and on way back she became quite lightheaded and was no longer able to stand.  Nursing home staff states that her legs seem to give out on her, left more than right, but that she never became flaccid. En route, EMS noticed blood pressures in the 80s over 30s. She has memory of the entire event.  She also complains of shortness of breath, feeling why she needs to gasp to get her breath.   LKW: 22:05 tpa given?: no, no evidence of new stroke, recent stroke within three-month    ROS: A 14 point ROS was performed and is negative except as noted in the HPI.  Past Medical History  Diagnosis Date  . Stroke   . Atrial fibrillation     Social: tob - not currently smoking  Exam: Current vital signs: BP 101/50  Pulse 65  Resp 18  SpO2 100% Vital signs in last 24 hours: Pulse Rate:  [65-73] 65 (01/11 2345) Resp:  [14-19] 18 (01/11 2345) BP: (96-106)/(37-50) 101/50 mmHg (01/11 2345) SpO2:  [100 %] 100 % (01/11 2345)  General: In bed, NAD CV: Regular rate and rhythm Mental Status: Patient is awake, alert, oriented to person,age place.* No signs of aphasia. She has a severe hemi-neglect. Cranial Nerves: II: patient does not respond as well to stimuli in the left visual field, but is able to see some stimuli from that side in both thte upper and lower field. Pupils are equal, round, and reactive to light.   III,IV, VI: EOMI without ptosis or diploplia.  V: Facial sensation is symmetric to  temperature VII: Facial movement is notable for left facial weakness VIII: hearing is intact to voice X: Uvula elevates symmetrically XI: Shoulder shrug is symmetric. XII: tongue is midline without atrophy or fasciculations.  Motor: Tone is normal. Bulk is normal. 5/5 strength was present in the right side, on the left she has 4- - 4/5 weakness throughout Sensory: Patient endorses symmetric sensation, but clearly is unable to differentiate which arms being touched, and extinguishes with double simultaneous stimulation. Plantars: Toes are downgoing on the right, upgoing on the left Cerebellar: Intact on right, consistent with weakness on left     I have reviewed labs in epic and the results pertinent to this consultation are: INR 1.6 CBC-hematocrit of 28  I have reviewed the images obtained: CT head-no acute findings  Impression: 80 year old female with recent right MCA stroke who is making good progress in rehabilitation. With her low blood pressures and anemia, I suspect that the lightheadedness and weakness that occurred earlier after recently standing and going to the bathroom is orthostasis rather than directly neurologically related.  Of note, she did recently have an NSTEMI for which she was admitted to [facility].  Recommendations: 1) continue elequis is 2.5 mg twice a day for secondary stroke prevention 2) no clear evidence of a primary neurological at this time. 3) neurology will sign off, defer workup for hypertension/shortness of breath to ED physicians.   Ritta Slot, MD [facility] [phone]  If 7pm-  7am, please page neurology on call at [phone].

## 2013-10-30 NOTE — ED Provider Notes (Signed)
CSN: [id number]     Arrival date & time 10/30/13  2256 History   First MD Initiated Contact with Patient 10/30/13 2257     Chief Complaint  Patient presents with  . Code Stroke   (Consider location/radiation/quality/duration/timing/severity/associated sxs/prior Treatment) The history is provided by the patient and the nursing home.  80 -year-old female was transferred from nursing home where she had sudden onset of weakness and dizziness while walking to the bathroom. She has a left hemiparesis from a recent stroke. She was reported to of been weaker on her left side than at baseline and speech was worse than baseline. EMS reported that she was improving in route. There is report of initial blood pressure of 80 systolic. In the ED, she is slightly slurred, and blood pressure has come. Denies any headache. She denies chest pain, dyspnea, nausea, vomiting.  Past Medical History  Diagnosis Date  . Stroke   . Atrial fibrillation    Past Surgical History  Procedure Laterality Date  . Radiology with anesthesia N/A 09/24/2013    Procedure: RADIOLOGY WITH ANESTHESIA;  Surgeon: Oneal Grout, MD;  Location: [facility] OR;  Service: Radiology;  Laterality: N/A;   No family history on file. History  Substance Use Topics  . Smoking status: Never Smoker   . Smokeless tobacco: Never Used  . Alcohol Use: Not on file   OB History   Grav Para Term Preterm Abortions TAB SAB Ect Mult Living                 Review of Systems  All other systems reviewed and are negative.    Allergies  Review of patient's allergies indicates no known allergies.  Home Medications   Current Outpatient Rx  Name  Route  Sig  Dispense  Refill  . amiodarone (PACERONE) 200 MG tablet   Oral   Take 1 tablet (200 mg total) by mouth 2 (two) times daily.   60 tablet   2   . apixaban (ELIQUIS) 2.5 MG TABS tablet   Oral   Take 1 tablet (2.5 mg total) by mouth 2 (two) times daily.   60 tablet   2   . atorvastatin  (LIPITOR) 20 MG tablet   Oral   Take 1 tablet (20 mg total) by mouth daily at 6 PM.   30 tablet   2   . Calcium-Vitamin D (CALTRATE 600 PLUS-VIT D PO)   Oral   Take 1 tablet by mouth daily.         . cholecalciferol (VITAMIN D) 1000 UNITS tablet   Oral   Take 1,000 Units by mouth daily.         Marland Kitchen diltiazem (CARDIZEM) 30 MG tablet   Oral   Take 1 tablet (30 mg total) by mouth every 12 (twelve) hours.   60 tablet   2   . famotidine (PEPCID) 20 MG tablet   Oral   Take 1 tablet (20 mg total) by mouth daily.   30 tablet   2   . levothyroxine (SYNTHROID, LEVOTHROID) 75 MCG tablet   Oral   Take 75 mcg by mouth daily before breakfast.         . Maltodextrin-Xanthan Gum (RESOURCE THICKENUP CLEAR) POWD   Oral   Take 120 g by mouth as needed (thicken liquids per order).   1 Can   2   . Misc Natural Products (OSTEO BI-FLEX TRIPLE STRENGTH PO)   Oral   Take 1 tablet by mouth  daily.         . Multiple Vitamins-Minerals (CENTRUM SILVER ULTRA WOMENS) TABS   Oral   Take 1 tablet by mouth daily.          BP 112/47  Pulse 69  Temp(Src) 98.4 F (36.9 C) (Oral)  Resp 16  Ht 5\' 7"  (1.702 m)  Wt 97 lb 14.2 oz (44.4 kg)  BMI 15.33 kg/m2  SpO2 100% Physical Exam  Nursing note and vitals reviewed.  79 year old female, resting comfortably and in no acute distress. Vital signs are normal. Oxygen saturation is 100%, which is normal. Head is normocephalic and atraumatic. PERRLA, EOMI. Oropharynx is clear. Neck is nontender and supple without adenopathy or JVD. There no carotid bruits. Back is nontender and there is no CVA tenderness. Lungs are clear without rales, wheezes, or rhonchi. Chest is nontender. Heart has regular rate and rhythm without murmur. Abdomen is soft, flat, nontender without masses or hepatosplenomegaly and peristalsis is normoactive. Extremities have no cyanosis or edema, full range of motion is present. Skin is warm and dry without rash. Neurologic:  She is awake and alert, speech is mildly to moderately dysarthric, mild left central facial droop is present, there is a moderately severe left hemiparesthesias.  ED Course  Procedures (including critical care time) Labs Review Results for orders placed during the hospital encounter of 10/30/13  ETHANOL      Result Value Range   Alcohol, Ethyl (B) <11  0 - 11 mg/dL  PROTIME-INR      Result Value Range   Prothrombin Time 16.4 (*) 11.6 - 15.2 seconds   INR 1.36  0.00 - 1.49  APTT      Result Value Range   aPTT 34  24 - 37 seconds  CBC      Result Value Range   WBC 8.1  4.0 - 10.5 K/uL   RBC 2.93 (*) 3.87 - 5.11 MIL/uL   Hemoglobin 9.2 (*) 12.0 - 15.0 g/dL   HCT 81.1 (*) 91.4 - 78.2 %   MCV 96.2  78.0 - 100.0 fL   MCH 31.4  26.0 - 34.0 pg   MCHC 32.6  30.0 - 36.0 g/dL   RDW 95.6 (*) 21.3 - 08.6 %   Platelets 261  150 - 400 K/uL  DIFFERENTIAL      Result Value Range   Neutrophils Relative % 71  43 - 77 %   Neutro Abs 5.7  1.7 - 7.7 K/uL   Lymphocytes Relative 23  12 - 46 %   Lymphs Abs 1.8  0.7 - 4.0 K/uL   Monocytes Relative 6  3 - 12 %   Monocytes Absolute 0.5  0.1 - 1.0 K/uL   Eosinophils Relative 0  0 - 5 %   Eosinophils Absolute 0.0  0.0 - 0.7 K/uL   Basophils Relative 0  0 - 1 %   Basophils Absolute 0.0  0.0 - 0.1 K/uL  COMPREHENSIVE METABOLIC PANEL      Result Value Range   Sodium 136 (*) 137 - 147 mEq/L   Potassium 4.3  3.7 - 5.3 mEq/L   Chloride 105  96 - 112 mEq/L   CO2 21  19 - 32 mEq/L   Glucose, Bld 117 (*) 70 - 99 mg/dL   BUN 28 (*) 6 - 23 mg/dL   Creatinine, Ser 5.78 (*) 0.50 - 1.10 mg/dL   Calcium 8.3 (*) 8.4 - 10.5 mg/dL   Total Protein 5.3 (*) 6.0 - 8.3  g/dL   Albumin 2.4 (*) 3.5 - 5.2 g/dL   AST 24  0 - 37 U/L   ALT 19  0 - 35 U/L   Alkaline Phosphatase 51  39 - 117 U/L   Total Bilirubin PENDING  0.3 - 1.2 mg/dL   GFR calc non Af Amer 45 (*) >90 mL/min   GFR calc Af Amer 52 (*) >90 mL/min  GLUCOSE, CAPILLARY      Result Value Range    Glucose-Capillary 63 (*) 70 - 99 mg/dL  GLUCOSE, CAPILLARY      Result Value Range   Glucose-Capillary 113 (*) 70 - 99 mg/dL  POCT I-STAT, CHEM 8      Result Value Range   Sodium 139  137 - 147 mEq/L   Potassium 4.2  3.7 - 5.3 mEq/L   Chloride 106  96 - 112 mEq/L   BUN 27 (*) 6 - 23 mg/dL   Creatinine, Ser 4.09 (*) 0.50 - 1.10 mg/dL   Glucose, Bld 811 (*) 70 - 99 mg/dL   Calcium, Ion 9.14  7.82 - 1.30 mmol/L   TCO2 20  0 - 100 mmol/L   Hemoglobin 9.5 (*) 12.0 - 15.0 g/dL   HCT 95.6 (*) 21.3 - 08.6 %  POCT I-STAT TROPONIN I      Result Value Range   Troponin i, poc 0.02  0.00 - 0.08 ng/mL   Comment 3            Imaging Review Ct Head Wo Contrast  10/30/2013   CLINICAL DATA:  Code stroke.  EXAM: CT HEAD WITHOUT CONTRAST  TECHNIQUE: Contiguous axial images were obtained from the base of the skull through the vertex without intravenous contrast.  COMPARISON:  10/02/2013.  FINDINGS: Skull and Sinuses:No significant abnormality.  Orbits: No acute abnormality.  Bilateral cataract resection.  Brain: Expected evolution of recent right MCA territory infarction, with resolution of mass effect. Fogging makes the margins of the infarct difficult to distinguish, limiting detection of peri-infarct ischemia. There is hazy high density within the caudate head and upper putamen, petechial hemorrhage versus dystrophic calcification. No hematoma. No hydrocephalus, mass lesion, or shift.  These results were called by telephone at the time of interpretation on 10/30/2013 at 11:17 PM to Dr. Amada Jupiter, who verbally acknowledged these results.  IMPRESSION: 1. No evidence of acute infarct or significant intracranial hemorrhage. 2. Expected evolution of recent right MCA territory infarction. There is either petechial hemorrhage or mineralization within the right caudate and putamen.   Electronically Signed   By: Tiburcio Pea M.D.   On: 10/30/2013 23:20    EKG Interpretation    Date/Time:  Sunday October 30 2013  23:11:00 EST Ventricular Rate:  65 PR Interval:  190 QRS Duration: 126 QT Interval:  442 QTC Calculation: 460 R Axis:   -42 Text Interpretation:  Sinus  rhythm Nonspecific IVCD with LAD LVH with secondary repolarization abnormality Anterior infarct, old When compared with ECG of 09/26/2013, Sinus rhythm has replaced Atrial fibrillation Confirmed by Preston Fleeting  MD, Keanon Bevins ([id number]) on 10/31/2013 12:06:31 AM           CRITICAL CARE Performed by: VHQIO,NGEXB Total critical care time: 35 minutes Critical care time was exclusive of separately billable procedures and treating other patients. Critical care was necessary to treat or prevent imminent or life-threatening deterioration. Critical care was time spent personally by me on the following activities: development of treatment plan with patient and/or surrogate as well as nursing, discussions with consultants, evaluation  of patient's response to treatment, examination of patient, obtaining history from patient or surrogate, ordering and performing treatments and interventions, ordering and review of laboratory studies, ordering and review of radiographic studies, pulse oximetry and re-evaluation of patient's condition.  MDM   1. Cerebral infarction due to embolism of right middle cerebral artery   2. Weakness   3. Hypotension   4. Renal insufficiency   5. Anemia    Episode of weakness which appears to be related to blood pressure drop-possible orthostatic blood pressure drop. She's improved with some IV hydration. Old records are reviewed and she had full workup for cerebrovascular disease at her hospital admission for stroke last month, so these do not need to be repeated. Patient has been seen by neurology who also believed that she shows no sign of a new stroke and do not feel that she needs any further evaluation from a cerebrovascular standpoint. Plan is to admit her for observation and ongoing hydration. There's been a slight increase and  creatinine over baseline. Case is discussed with Dr. Berline Choughigby of family medicine service who agrees to admit the patient.    Dione Boozeavid Leander Tout, MD 10/31/13 [phone]

## 2013-10-30 NOTE — ED Notes (Signed)
Pt arrived from [facility] rehab via [facility] c/o left sided weakness, garbled speech, facial droop. Last seen normal at 2205.

## 2013-10-30 NOTE — ED Notes (Signed)
Pt states that after standing up she felt weak and dizzy.

## 2013-10-30 NOTE — ED Notes (Signed)
Put patient on bedpan. Patient was unsuccessful in attempt to urinate. Notified RN

## 2013-10-31 ENCOUNTER — Observation Stay (HOSPITAL_COMMUNITY): Payer: Medicare Other

## 2013-10-31 ENCOUNTER — Encounter (HOSPITAL_COMMUNITY): Payer: Self-pay | Admitting: *Deleted

## 2013-10-31 DIAGNOSIS — R5383 Other fatigue: Secondary | ICD-10-CM

## 2013-10-31 DIAGNOSIS — I959 Hypotension, unspecified: Secondary | ICD-10-CM

## 2013-10-31 DIAGNOSIS — D649 Anemia, unspecified: Secondary | ICD-10-CM

## 2013-10-31 DIAGNOSIS — R5381 Other malaise: Secondary | ICD-10-CM

## 2013-10-31 DIAGNOSIS — R531 Weakness: Secondary | ICD-10-CM | POA: Diagnosis present

## 2013-10-31 LAB — URINALYSIS, ROUTINE W REFLEX MICROSCOPIC
BILIRUBIN URINE: NEGATIVE
GLUCOSE, UA: NEGATIVE mg/dL
KETONES UR: NEGATIVE mg/dL
Nitrite: NEGATIVE
PROTEIN: 30 mg/dL — AB
Specific Gravity, Urine: 1.024 (ref 1.005–1.030)
Urobilinogen, UA: 0.2 mg/dL (ref 0.0–1.0)
pH: 7 (ref 5.0–8.0)

## 2013-10-31 LAB — BASIC METABOLIC PANEL
BUN: 23 mg/dL (ref 6–23)
CHLORIDE: 106 meq/L (ref 96–112)
CO2: 22 mEq/L (ref 19–32)
CREATININE: 0.99 mg/dL (ref 0.50–1.10)
Calcium: 7.9 mg/dL — ABNORMAL LOW (ref 8.4–10.5)
GFR calc non Af Amer: 52 mL/min — ABNORMAL LOW (ref >90)
GFR, EST AFRICAN AMERICAN: 61 mL/min — AB (ref >90)
Glucose, Bld: 80 mg/dL (ref 70–99)
Potassium: 4.4 mEq/L (ref 3.7–5.3)
Sodium: 138 mEq/L (ref 137–147)

## 2013-10-31 LAB — CBC
HEMATOCRIT: 25 % — AB (ref 36.0–46.0)
Hemoglobin: 8.5 g/dL — ABNORMAL LOW (ref 12.0–15.0)
MCH: 32.1 pg (ref 26.0–34.0)
MCHC: 34 g/dL (ref 30.0–36.0)
MCV: 94.3 fL (ref 78.0–100.0)
Platelets: 237 10*3/uL (ref 150–400)
RBC: 2.65 MIL/uL — ABNORMAL LOW (ref 3.87–5.11)
RDW: 17.3 % — AB (ref 11.5–15.5)
WBC: 7.7 10*3/uL (ref 4.0–10.5)

## 2013-10-31 LAB — COMPREHENSIVE METABOLIC PANEL
ALT: 19 U/L (ref 0–35)
AST: 24 U/L (ref 0–37)
Albumin: 2.4 g/dL — ABNORMAL LOW (ref 3.5–5.2)
Alkaline Phosphatase: 51 U/L (ref 39–117)
BUN: 28 mg/dL — AB (ref 6–23)
CALCIUM: 8.3 mg/dL — AB (ref 8.4–10.5)
CO2: 21 mEq/L (ref 19–32)
CREATININE: 1.13 mg/dL — AB (ref 0.50–1.10)
Chloride: 105 mEq/L (ref 96–112)
GFR calc non Af Amer: 45 mL/min — ABNORMAL LOW (ref >90)
GFR, EST AFRICAN AMERICAN: 52 mL/min — AB (ref >90)
GLUCOSE: 117 mg/dL — AB (ref 70–99)
Potassium: 4.3 mEq/L (ref 3.7–5.3)
Sodium: 136 mEq/L — ABNORMAL LOW (ref 137–147)
TOTAL PROTEIN: 5.3 g/dL — AB (ref 6.0–8.3)
Total Bilirubin: <0.2 mg/dL — ABNORMAL LOW (ref 0.3–1.2)

## 2013-10-31 LAB — RAPID URINE DRUG SCREEN, HOSP PERFORMED
AMPHETAMINES: NOT DETECTED
BENZODIAZEPINES: NOT DETECTED
Barbiturates: NOT DETECTED
COCAINE: NOT DETECTED
OPIATES: NOT DETECTED
Tetrahydrocannabinol: NOT DETECTED

## 2013-10-31 LAB — MRSA PCR SCREENING: MRSA by PCR: NEGATIVE

## 2013-10-31 LAB — TROPONIN I
Troponin I: <0.3 ng/mL (ref <0.30)
Troponin I: <0.3 ng/mL (ref <0.30)
Troponin I: <0.3 ng/mL (ref <0.30)

## 2013-10-31 LAB — URINE MICROSCOPIC-ADD ON

## 2013-10-31 LAB — TSH: TSH: 5.195 u[IU]/mL — ABNORMAL HIGH (ref 0.350–4.500)

## 2013-10-31 MED ORDER — BIOTENE DRY MOUTH MT LIQD
15.0000 mL | Freq: Two times a day (BID) | OROMUCOSAL | Status: DC
  Administered 2013-10-31 – 2013-11-01 (×3): 15 mL via OROMUCOSAL

## 2013-10-31 MED ORDER — SODIUM CHLORIDE 0.9 % IV SOLN
INTRAVENOUS | Status: AC
  Administered 2013-10-31: 950 mL via INTRAVENOUS

## 2013-10-31 MED ORDER — ATORVASTATIN CALCIUM 20 MG PO TABS
20.0000 mg | ORAL_TABLET | Freq: Every day | ORAL | Status: DC
  Administered 2013-10-31 – 2013-11-01 (×2): 20 mg via ORAL
  Filled 2013-10-31 (×3): qty 1

## 2013-10-31 MED ORDER — TRAMADOL HCL 50 MG PO TABS
50.0000 mg | ORAL_TABLET | Freq: Three times a day (TID) | ORAL | Status: DC | PRN
  Administered 2013-10-31 – 2013-11-02 (×3): 50 mg via ORAL
  Filled 2013-10-31 (×2): qty 1

## 2013-10-31 MED ORDER — APIXABAN 2.5 MG PO TABS
2.5000 mg | ORAL_TABLET | Freq: Two times a day (BID) | ORAL | Status: DC
  Administered 2013-10-31 – 2013-11-02 (×4): 2.5 mg via ORAL
  Filled 2013-10-31 (×6): qty 1

## 2013-10-31 MED ORDER — SENNOSIDES-DOCUSATE SODIUM 8.6-50 MG PO TABS
1.0000 | ORAL_TABLET | Freq: Every evening | ORAL | Status: DC | PRN
  Filled 2013-10-31: qty 1

## 2013-10-31 MED ORDER — AMIODARONE HCL 200 MG PO TABS
200.0000 mg | ORAL_TABLET | Freq: Two times a day (BID) | ORAL | Status: DC
  Administered 2013-10-31 – 2013-11-02 (×4): 200 mg via ORAL
  Filled 2013-10-31 (×6): qty 1

## 2013-10-31 MED ORDER — DILTIAZEM HCL 30 MG PO TABS
30.0000 mg | ORAL_TABLET | Freq: Two times a day (BID) | ORAL | Status: DC
  Administered 2013-10-31 – 2013-11-01 (×3): 30 mg via ORAL
  Filled 2013-10-31 (×6): qty 1

## 2013-10-31 MED ORDER — CHLORHEXIDINE GLUCONATE 0.12 % MT SOLN
15.0000 mL | Freq: Two times a day (BID) | OROMUCOSAL | Status: DC
  Administered 2013-10-31 – 2013-11-02 (×4): 15 mL via OROMUCOSAL
  Filled 2013-10-31 (×7): qty 15

## 2013-10-31 MED ORDER — LEVOTHYROXINE SODIUM 75 MCG PO TABS
75.0000 ug | ORAL_TABLET | Freq: Every day | ORAL | Status: DC
  Administered 2013-11-01 – 2013-11-02 (×2): 75 ug via ORAL
  Filled 2013-10-31 (×4): qty 1

## 2013-10-31 NOTE — H&P (Signed)
Family Medicine Teaching Service Service Pager: [phone]  Dorothy LangtonFrances [name] 80 y.o. female  MRN: [id number]  DOB: 1931/04/20    LOS: 1 day  DNR PCP: Provider Not In System  CONSULTANTS: Neuro   PATIENT OVERVIEW  Pt admitted on 10/30/2013 for presyncopal episode.  Her PMHx is significant for 2 recent significant hospitalizations including for a ischemic stroke in early December of 2014 at [name] and a subsequent admission at [name] for an NSTEMI.  She has been currently living in a SNF undergoing therapy.  She was found to have a recurrent episode of left-sided weakness, dysarthria, dizziness and presyncopal episode following a bowel movement.  1/12 - presented to [name] emergency department via code stroke - not a TPA candidate 1/12 - admitted for syncopal work up  HPI & ROS   Dorothy LangtonFrances Chavero is a 80 y.o. year old female who expressed a presyncopal episode after returning from the bathroom earlier this evening.  She was found to have significant worsening of her left-sided weakness as residual from her stroke in December.  There is report of entire left sided flaccidity was significantly improved upon evaluation in emergency department.  She has been doing well at therapy and his progress to walking on her own.  During her last hospitalization she was found to be in A. fib with RVR with reduced ejection fraction.  She also underwent an angiographic thrombectomy and was subsequently intubated for 2 days.  During her hospitalization at [name] on 12-27 she was found to have an NSTEMI. No further records available.  During her acute hospitalization for stroke was recommended she go see CIR but it was felt best that she would stay close to home and has been performing therapy in a SNF.  She subsequently developed chest pain on 12/27 and diagnosed with an end STEMI.  Her chest pain has resolved since that time.  Further ROS: She denies any significant chest pain or palpitations  currently but does report tachypalpitations around the time of the episode.  She denies fevers, chills, cough, congestion.  No melena or hematochezia.  No dysuria, frequency, hesitancy.   HISTORY   Past Medical History  Diagnosis Date  . Stroke   . Atrial fibrillation    Past Surgical History  Procedure Laterality Date  . Radiology with anesthesia N/A 09/24/2013    Procedure: RADIOLOGY WITH ANESTHESIA;  Surgeon: Oneal GroutSanjeev K Deveshwar, MD;  Location: [facility] OR;  Service: Radiology;  Laterality: N/A;   Social Hx: History   Social History  . Marital Status: Married    Spouse Name: N/A    Number of Children: N/A  . Years of Education: N/A   Social History Main Topics  . Smoking status: Never Smoker   . Smokeless tobacco: Never Used  . Alcohol Use: None  . Drug Use: None  . Sexual Activity: None   Other Topics Concern  . None   Social History Narrative  . None   History reviewed. No pertinent family history. No Known Allergies  Home Medications: Medication Sig  amiodarone (PACERONE) 200 MG tablet Take 1 tablet (200 mg total) by mouth 2 (two) times daily.  apixaban (ELIQUIS) 2.5 MG TABS tablet Take 1 tablet (2.5 mg total) by mouth 2 (two) times daily.  atorvastatin (LIPITOR) 20 MG tablet Take 1 tablet (20 mg total) by mouth daily at 6 PM.  Calcium-Vitamin D (CALTRATE 600 PLUS-VIT D PO) Take 1 tablet by mouth daily.  cholecalciferol (VITAMIN D) 1000 UNITS tablet Take  1,000 Units by mouth daily.  diltiazem (CARDIZEM) 30 MG tablet Take 1 tablet (30 mg total) by mouth every 12 (twelve) hours.  famotidine (PEPCID) 20 MG tablet Take 1 tablet (20 mg total) by mouth daily.  levothyroxine (SYNTHROID, LEVOTHROID) 75 MCG tablet Take 75 mcg by mouth daily before breakfast.  Maltodextrin-Xanthan Gum (RESOURCE THICKENUP CLEAR) POWD Take 120 g by mouth as needed (thicken liquids per order).  Misc Natural Products (OSTEO BI-FLEX TRIPLE STRENGTH PO) Take 1 tablet by mouth daily.  Multiple  Vitamins-Minerals (CENTRUM SILVER ULTRA WOMENS) TABS Take 1 tablet by mouth daily.   OBJECTIVE  Vitals: Temp:  [98 F (36.7 C)-98.4 F (36.9 C)] 98.4 F (36.9 C) (01/12 0456) Pulse Rate:  [62-73] 69 (01/12 0456) Resp:  [13-22] 16 (01/12 0456) BP: (96-126)/(37-62) 112/47 mmHg (01/12 0456) SpO2:  [92 %-100 %] 100 % (01/12 0456) Weight:  [97 lb 14.2 oz (44.4 kg)] 97 lb 14.2 oz (44.4 kg) (01/12 0253)  Weight: Filed Weights   10/31/13 0253  Weight: 97 lb 14.2 oz (44.4 kg)   I&Os:  Intake/Output Summary (Last 24 hours) at 10/31/13 0522 Last data filed at 10/31/13 0100  Gross per 24 hour  Intake   1000 ml  Output     50 ml  Net    950 ml   PHYSICAL EXAM: GENERAL:  elderly Caucasian female. In no discomfort; no respiratory distress  PSYCH: alert and interactive but not oriented to time or place.  Oriented to person and situation, able to follow commands but somnolent   HNEENT:  moist the membranes, no JVD   CARDIO: RRR, S1/S2 heard, no murmur  LUNGS: CTA B, no wheezes, minimal bibasilar crackles  ABDOMEN:   EXTREM:  Warm, well perfused.  Moves all 4 extremities spontaneously but with noticeable left-sided neglect; patient's left-sided strength inconsistent on testing. Distal pulses 2+/4.  Trace pretibial edema.  GU:   SKIN:    EKG: Sinus rhythm, left bundle branch block  LABS: Daily Others:   Recent Labs Lab 10/30/13 2319  WBC 8.1  HGB 9.2*  9.5*  HCT 28.2*  28.0*  PLT 261    Recent Labs Lab 10/30/13 2319  NA 136*  139  K 4.3  4.2  CL 105  106  CO2 21  BUN 28*  27*  CREATININE 1.13*  1.20*  GLUCOSE 117*  117*  CALCIUM 8.3*    Recent Labs  09/25/13 0520 09/26/13 1325  HGBA1C 6.1*  --   TRIG 78  --   CHOL 109  --   HDL 26*  --   LDLCALC 67  --   TSH  --  1.676    Recent Labs Lab 10/30/13 2317  TROPIPOC 0.02      URINE STUDIES: 1/12 - UA: Protein 30: Nitrite negative, small leukocytes 11-20 WBC, TNTC RBC  IMAGING: 09/26/13 - 2D ECHO:  EF 30-35%, insufficent to eval for Wall motion abnormalities; Peak PA 09/26/13 - Carotid Doppler: no significant stenosis  1/12 - 1V CX: Volume overload, no pneumonia, significant osteoarthritis of the shoulders  MICRO: ABX: MICRO:  None  1/12 - UCx >>>   Medications:  Continuous meds:   Scheduled meds: . sodium chloride   Intravenous STAT  . amiodarone  200 mg Oral BID  . apixaban  2.5 mg Oral BID  . atorvastatin  20 mg Oral q1800  . dextrose      . diltiazem  30 mg Oral Q12H  . levothyroxine  75 mcg Oral  QAC breakfast   PRN meds: senna-docusate Assessment & Plan  Syncope/Presyncope, s/p CVA with left-sided hemiparesis: Unclear what sounds to be vagovagal syncopal episode following using the restroom.  Reemergence of her symptoms with a hypotensive episode are not surprising.   - Recent complete stroke workup.  No indication for repeating at this time and Dr. Amada Jupiter is in agreement. - I reviewed the prior hospitalizations there is no further intervention from a stroke risk reduction standpoint that needs to be completed. - continue eliquis, atorvastatin - Holding PT/OT as already need established.  ST for swallowing reevaluation.  Cardiac: Recent NSTEMI, Systolic CHF, A Fib - tele to monitor for other arrhythmias. - continue amiodarone and diltiazem - Cycle cardiac enzymes - Continue Eliquis - Repeat ECHO to re-eval EF - appears to be Euvolumic at this time  Hypothyroidism: - continue home dose. - recheck given significant medication changes since check  PROPHYLAXIS: Eliquis DIET: Cardiac  Disposition  To Tele for ACS eval, syncope workup and ST re-evaluation Social work consult  Follow-up issues:    [ ]  ECHO [ ]  Troponins    Andrena Mews, DO [facility] Resident - PGY-3 10/31/2013 5:22 AM

## 2013-10-31 NOTE — H&P (Addendum)
FMTS Attending Admission Note: Kehinde Eniola,MD I  have seen and examined this patient, reviewed their chart. I have discussed this patient with the resident. I agree with the resident's findings, assessment and care plan.  80 y/O F with pmx of recent ischemic stroke in 2014,NSTEMI,AFIB presented with hx of left sided weakness,patient could not give adequate hx this morning,but according to documentation she was found on the floor of her bathroom. She denies LOC or head trauma.  Filed Vitals:   10/31/13 0200 10/31/13 0215 10/31/13 0253 10/31/13 0456  BP: 106/50 105/44 105/44 112/47  Pulse: 62 62 65 69  Temp:    98.4 F (36.9 C)  TempSrc:    Oral  Resp: 13 20 16 16   Height:   5\' 7"  (1.702 m)   Weight:   97 lb 14.2 oz (44.4 kg)   SpO2: 97% 100% 100% 100%   Exam: Gen: Not in distress. Neuro: Sleepy, +DTR, power across joints about 4/5 except on the left which is about 3/5. Resp: Air entry clear and equal B/L. CV: RRR,S1S2 normal no murmurs. Abdomen: Benign. Ext: No edema.  A/P: 80 Y/O F with 1. Left sided weakness, ?? New onset stroke vs orthostatic hypotension.     CT head reviewed with no intracranial bleed or abnormality.     She recently had full stroke work up few weeks ago.     Neurologist had seen patient and does not seem impressed.     I recommend PT and fall precautions.     If weakness persist or worsens to redo her stroke work up.     Assess for postural hypotension.  2. Anemia: likely chronic.     Might be nutritional.     MVI and Iron sulphate recommended.    Monitor CBC.     Consider GI if continue to drop her hemoglobin.  3. Hypotension: On admission improved S/P bolus of NS.     Monitor V/S.  NB: I got a message from her nurse she had blood clot in her foley bag this morning,this might be the source of her anemia, advise to contact Urologist,will sign out to Donnella Sham MD who is the attending from 8am.  Patient signed off to [facility] @ 8:30am on  10/31/13

## 2013-10-31 NOTE — ED Notes (Addendum)
Resting sleeping, arousable to voice, interactive, calm, NAD, follows commands, VSS, failed swallow screen d/t hx. (Denies: pain, sob, nausea dizziness or HA).

## 2013-10-31 NOTE — ED Notes (Addendum)
Pt alert, NAD, calm, interactive, resps e/u, speaking in clear complete sentences, dentures not in place, speech clear, generally weak, mentions L shoulder injury and R elbow injury, MAEx4, (denies: pain, HA, sob, dizziness or nausea), family x2 at [facility].

## 2013-10-31 NOTE — Progress Notes (Signed)
UR completed 

## 2013-10-31 NOTE — Progress Notes (Signed)
CSW spoke to patient and patient's husband by bedside. Patient confirms she is from [facility] and would like to go back to finish therapy. [name] states that he was talked to [facility] and they are holding her bed. [name] shared stories about patient and their dogs to Child psychotherapist. Patient and husband very pleasant.  Maree Krabbe, MSW, [facility]ors [phone]

## 2013-10-31 NOTE — Progress Notes (Signed)
CSW went by patient's room to look for family, currently no family at bedside. CSW then contacted patient's husband via telephone, no answer. CSW then contacted patient's nephew (whose number is on the chart). Patient's nephew confirmed that patient is from [facility] and for social worker to contact her husband.   Maree Krabbe, MSW, [facility]ia Majors [phone]

## 2013-10-31 NOTE — Progress Notes (Signed)
Clinical Social Work Department BRIEF PSYCHOSOCIAL ASSESSMENT 10/31/2013  Patient:  [name]     Account Number:  [id number]     Admit date:  10/30/2013  Clinical Social Worker:  Carren Rang  Date/Time:  10/31/2013 11:56 AM  Referred by:  RN  Date Referred:  10/31/2013 Referred for  SNF Placement   Other Referral:   Interview type:  Family Other interview type:   CSW spoke to patient's husband    PSYCHOSOCIAL DATA Living Status:  FACILITY Admitted from facility:  [facility] Level of care:  Skilled Nursing Facility Primary support name:  [name] Primary support relationship to patient:  SPOUSE Degree of support available:   Good    CURRENT CONCERNS Current Concerns  Post-Acute Placement   Other Concerns:    SOCIAL WORK ASSESSMENT / PLAN CSW received consult from MD that patient is from a facility. CSW reviewed chart and read notes that patient is from [facility] CSW also noticied in chart that patient is only alert to person. CSW then contacted patient's husband. Left voicemail. CSW then contacted other patient's contact, which is her nephew and spoke to him regarding patient. Patient's nephew confirmed that patient was from [facility] and the plan was for patient to return back, but informed social worker to call patient's husband as well.   Assessment/plan status:  Psychosocial Support/Ongoing Assessment of Needs Other assessment/ plan:   Information/referral to community resources:   CSW information    PATIENT'S/FAMILY'S RESPONSE TO PLAN OF CARE: [name] did not answer phone call, CSW then spoke to patient's nephew who confirmed that patient was from facility, but told social worker to call patient's husband.        [name], MSW, [name] [phone]

## 2013-10-31 NOTE — ED Notes (Signed)
Xray at BS 

## 2013-10-31 NOTE — ED Notes (Addendum)
No changes. Family x2 at [facility] Pt placed on BP. Pt able to lift hips up off of stretcher, follows commands. Pending bed assignment. No changes.

## 2013-11-01 ENCOUNTER — Encounter (HOSPITAL_COMMUNITY)
Admission: EM | Disposition: A | Discharge status: Skilled Nursing Facility | Payer: Self-pay | Source: Home / Self Care | Attending: Family Medicine

## 2013-11-01 ENCOUNTER — Observation Stay (HOSPITAL_COMMUNITY): Payer: Medicare Other

## 2013-11-01 DIAGNOSIS — I059 Rheumatic mitral valve disease, unspecified: Secondary | ICD-10-CM

## 2013-11-01 DIAGNOSIS — I4891 Unspecified atrial fibrillation: Secondary | ICD-10-CM

## 2013-11-01 DIAGNOSIS — E079 Disorder of thyroid, unspecified: Secondary | ICD-10-CM

## 2013-11-01 DIAGNOSIS — I495 Sick sinus syndrome: Secondary | ICD-10-CM

## 2013-11-01 DIAGNOSIS — M6281 Muscle weakness (generalized): Secondary | ICD-10-CM

## 2013-11-01 HISTORY — PX: PACEMAKER INSERTION: SHX728

## 2013-11-01 HISTORY — PX: PERMANENT PACEMAKER INSERTION: SHX5480

## 2013-11-01 LAB — URINE CULTURE
Colony Count: NO GROWTH
Culture: NO GROWTH

## 2013-11-01 SURGERY — PERMANENT PACEMAKER INSERTION
Anesthesia: LOCAL

## 2013-11-01 MED ORDER — SODIUM CHLORIDE 0.9 % IJ SOLN: 3.0000 mL | INTRAMUSCULAR | Status: DC | PRN

## 2013-11-01 MED ORDER — LIDOCAINE HCL (PF) 1 % IJ SOLN
INTRAMUSCULAR | Status: AC
  Filled 2013-11-01: qty 60

## 2013-11-01 MED ORDER — SODIUM CHLORIDE 0.9 % IV SOLN: 250.0000 mL | INTRAVENOUS | Status: DC | PRN

## 2013-11-01 MED ORDER — SODIUM CHLORIDE 0.9 % IV SOLN
INTRAVENOUS | Status: DC
  Administered 2013-11-01: 15:00:00 via INTRAVENOUS

## 2013-11-01 MED ORDER — CEFAZOLIN SODIUM-DEXTROSE 2-3 GM-% IV SOLR
2.0000 g | INTRAVENOUS | Status: DC
  Filled 2013-11-01: qty 50

## 2013-11-01 MED ORDER — SODIUM CHLORIDE 0.9 % IJ SOLN
3.0000 mL | Freq: Two times a day (BID) | INTRAMUSCULAR | Status: DC
  Administered 2013-11-01 – 2013-11-02 (×2): 3 mL via INTRAVENOUS

## 2013-11-01 MED ORDER — CEFAZOLIN SODIUM 1-5 GM-% IV SOLN
1.0000 g | Freq: Four times a day (QID) | INTRAVENOUS | Status: AC
  Administered 2013-11-01 – 2013-11-02 (×3): 1 g via INTRAVENOUS
  Filled 2013-11-01 (×4): qty 50

## 2013-11-01 MED ORDER — CHLORHEXIDINE GLUCONATE 4 % EX LIQD
60.0000 mL | Freq: Once | CUTANEOUS | Status: DC
  Administered 2013-11-01: 4 via TOPICAL
  Filled 2013-11-01: qty 60

## 2013-11-01 MED ORDER — ONDANSETRON HCL 4 MG/2ML IJ SOLN: 4.0000 mg | Freq: Four times a day (QID) | INTRAMUSCULAR | Status: DC | PRN

## 2013-11-01 MED ORDER — ACETAMINOPHEN 325 MG PO TABS: 325.0000 mg | ORAL_TABLET | ORAL | Status: DC | PRN

## 2013-11-01 MED ORDER — CHLORHEXIDINE GLUCONATE 4 % EX LIQD
60.0000 mL | Freq: Once | CUTANEOUS | Status: AC
  Administered 2013-11-01: 4 via TOPICAL
  Filled 2013-11-01: qty 60

## 2013-11-01 MED ORDER — SODIUM CHLORIDE 0.9 % IR SOLN
80.0000 mg | Status: DC
  Filled 2013-11-01: qty 2

## 2013-11-01 MED ORDER — HYDROCODONE-ACETAMINOPHEN 5-325 MG PO TABS: 1.0000 | ORAL_TABLET | ORAL | Status: DC | PRN

## 2013-11-01 NOTE — Procedures (Signed)
Objective Swallowing Evaluation: Modified Barium Swallowing Study  Patient Details  Name: [name] MRN: [id number] Date of Birth: 12/29/1930  ToRodney Langtonday's Date: 11/01/2013 Time: [id number] SLP Time Calculation (min): 15 min  Past Medical History:  Past Medical History  Diagnosis Date  . Stroke   . Atrial fibrillation    Past Surgical History:  Past Surgical History  Procedure Laterality Date  . Radiology with anesthesia N/A 09/24/2013    Procedure: RADIOLOGY WITH ANESTHESIA;  Surgeon: Oneal GroutSanjeev K Deveshwar, MD;  Location: [facility] OR;  Service: Radiology;  Laterality: N/A;   HPI:  Dorothy LangtonFrances Gasca is a 80 y.o. year old female who expressed a presyncopal episode after returning from the bathroom earlier this evening.  She was found to have significant worsening of her left-sided weakness as residual from her stroke in December.  There is report of entire left sided flaccidity was significantly improved upon evaluation in emergency department.  She has been doing well at therapy and his progress to walking on her own.   Pt underwent MBS during December admission (09/24/13), which revealed mod-severe dysphagia with silent aspiration.  D[facility] to SNF on a restrictive dysphagia 1, honey-thick liquid diet.   Bedside swallow eval today revealed improvements with recs for MBS.    Assessment / Plan / Recommendation Clinical Impression  Dysphagia Diagnosis: Mild oral phase dysphagia;Mild pharyngeal phase dysphagia Clinical impression: Pt demonstrates improved swallow function since 12/12 MBS. Demonstrates improved oral control and manipulation, speed of swallow response, and pharyngeal strength.  There was no residue in pharynx post-swallow.  Silent aspiration of thin liquids persists, with no pt awareness of event.  Nectar-thick liquids were consumed without laryngeal penetration/aspiration.  Pt has made excellent gains.  Recommend Dysphagia 3 diet with chopped meats; nectar-thick liquids; meds whole in  puree.  Chin tuck no longer warranted.  Pt will benefit from ongoing SLP therapy at SNF to address dysphagia.    Treatment Recommendation  Therapy as outlined in treatment plan below /see care plan   Diet Recommendation Dysphagia 3 (Mechanical Soft);Nectar-thick liquid   Liquid Administration via: Cup Medication Administration: Whole meds with puree Supervision: Patient able to self feed;Full supervision/cueing for compensatory strategies Compensations: Slow rate;Small sips/bites Postural Changes and/or Swallow Maneuvers: Seated upright 90 degrees    Other  Recommendations Recommended Consults: MBS Oral Care Recommendations: Oral care BID Other Recommendations: Order thickener from pharmacy   Follow Up Recommendations  Skilled Nursing facility           SLP Swallow Goals  see care plan   General   Type of Study: Modified Barium Swallowing Study Reason for Referral: Objectively evaluate swallowing function Previous Swallow Assessment: 09/30/13 Diet Prior to this Study: Dysphagia 1 (puree);Honey-thick liquids Temperature Spikes Noted: No Respiratory Status: Room air History of Recent Intubation: No Behavior/Cognition: Alert;Confused Oral Cavity - Dentition: Edentulous;Dentures, not available Oral Motor / Sensory Function:  (mild asymmetry left CN VII) Self-Feeding Abilities: Able to feed self Patient Positioning: Upright in chair Baseline Vocal Quality: Clear Volitional Cough: Strong Volitional Swallow: Able to elicit Anatomy:  (appearance of osteophytes along cervical vert  - no MD to confirm; prominent CP) Pharyngeal Secretions: Not observed secondary MBS    Reason for Referral Objectively evaluate swallowing function   Oral Phase Oral Preparation/Oral Phase Oral Phase: Impaired Oral - Solids Oral - Puree: Weak lingual manipulation Oral - Mechanical Soft: Weak lingual manipulation Oral Phase - Comment Oral Phase - Comment: dentures not present for study    Pharyngeal Phase Pharyngeal Phase  Pharyngeal Phase: Impaired Pharyngeal - Nectar Pharyngeal - Nectar Cup: Delayed swallow initiation Pharyngeal - Thin Pharyngeal - Thin Cup: Delayed swallow initiation;Penetration/Aspiration during swallow;Trace aspiration Penetration/Aspiration details (thin cup): Material enters airway, passes BELOW cords without attempt by patient to eject out (silent aspiration) Pharyngeal - Solids Pharyngeal - Puree: Premature spillage to valleculae Pharyngeal - Mechanical Soft: Premature spillage to valleculae  Cervical Esophageal Phase not assessed                Keria Widrig L. Samson Frederic, [place] CCC/SLP Pager [phone]  Blenda Mounts Laurice 11/01/2013, 2:35 PM

## 2013-11-01 NOTE — Progress Notes (Signed)
Family Medicine Teaching Service Daily Progress Note Intern Pager: [phone]  Patient name: [name] Medical record number: [id number] Date of birth: 01/29/1931 Age: 80 y.o. Gender: female  Primary Care Provider: Provider Not In System Consultants: Cardiology Code Status: DNR  Pt Overview and Major Events to Date:  1/11: Admitted for syncope work up after CVA ruled out 1/13: Echo, cardiology consult  Assessment and Plan: Pt admitted on 10/30/2013 for presyncopal episode. Her PMHx is significant for recent ischemic stroke in early December of 2014 and a subsequent admission for an NSTEMI. Stroke ruled out at admission.   Syncope/Presyncope, s/p CVA with left-sided hemiparesis: Favor cardiac etiology with h/o afib on amiodarone, diltiazem, reduced EF and pauses on telemetry.  - CT head negative, no need to repeat recent stroke work up - continue eliquis, atorvastatin   Cardiac: Recent NSTEMI, Systolic CHF, A Fib  - Many 3-4 second pauses overnight on telemetry, some with concordant symptoms of dizziness.  - continue amiodarone and diltiazem  - CE's negative - Continue Eliquis  - Repeat ECHO read pending - Cardiology consultation appreciated  Hypothyroidism: TSH 5.195 (hypothyroidism with insufficient dose vs. acute elevation. Not source of hospital problem. - continue synthroid , revisit this as outpatient   PROPHYLAXIS: Eliquis  DIET: Cardiac  Disposition: Continued evaluation and telemetric monitoring  Subjective: Pt without complaints of pain at this time, some dizziness overnight while laying down and while sitting up. No palpitations, chest pain, SOB.   Objective: Temp:  [98.1 F (36.7 C)-98.4 F (36.9 C)] 98.2 F (36.8 C) (01/13 0915) Pulse Rate:  [65-94] 80 (01/13 0915) Resp:  [16-18] 18 (01/13 0915) BP: (85-102)/(37-61) 94/47 mmHg (01/13 0915) SpO2:  [90 %-98 %] 90 % (01/13 0915) Physical Exam: Gen: Elderly 80 y.o.female in NAD  HEENT: MMM, posterior  oropharynx clear  Pulm: Non-labored; CTAB, no wheezes  CV: Irregularly irregular, HR <100 , no murmur appreciated; distal pulses intact/symmetric  GI: +BS; soft, non-tender, non-distended  Skin: No rashes, wounds, ulcers  Neuro: Alert and oriented to person and place, conversant, L-sided neglect  Laboratory:  Recent Labs Lab 10/30/13 2319 10/31/13 0700  WBC 8.1 7.7  HGB 9.2*  9.5* 8.5*  HCT 28.2*  28.0* 25.0*  PLT 261 237    Recent Labs Lab 10/30/13 2319 10/31/13 0700  NA 136*  139 138  K 4.3  4.2 4.4  CL 105  106 106  CO2 21 22  BUN 28*  27* 23  CREATININE 1.13*  1.20* 0.99  CALCIUM 8.3* 7.9*  PROT 5.3*  --   BILITOT <0.2*  --   ALKPHOS 51  --   ALT 19  --   AST 24  --   GLUCOSE 117*  117* 80   10/31/2013 07:00   TSH  5.195 (H)    Recent Labs  Lab  10/31/13 0700  10/31/13 1120  10/31/13 1745   TROPONINI  <0.30  <0.30  <0.30    Portable CXR  FINDINGS:  Mild cardiomegaly. Diffuse interstitial coarsening. No effusion or  pneumothorax. Severe right glenohumeral osteoarthritis with chronic  rotator cuff causing high riding humeral head.  IMPRESSION:  Cardiomegaly and pulmonary edema.  Hazeline Junker, MD 11/01/2013, 11:53 AM PGY-1, [facility] Family Medicine FPTS Intern pager: [phone], text pages welcome

## 2013-11-01 NOTE — Progress Notes (Signed)
*   Echocardiogram 2D Echocardiogram has been performed.  Arvil Chaco 11/01/2013, 10:42 AM

## 2013-11-01 NOTE — H&P (View-Only) (Signed)
ELECTROPHYSIOLOGY CONSULT NOTE    Patient ID: Dorothy LangtonFrances Wedig MRN: [id number], DOB/AGE: [birth year] 78 y.o.  Admit date: 10/30/2013 Date of Consult: 11/01/2013  Primary Cardiologist: [facility] to [facility]  Reason for Consultation: symptomatic bradycardia  HPI:  Mrs. Dorothy JesterCurtis is a 80 year old female with a past medical history notable for recent stroke and newly diagnosed atrial fibrillation.  She was admitted in December of 2014 with ischemic stroke and then subsequently was [facility] with [facility].  She is currently in rehab recovering.  Since her stroke and new diagnosis of afib with RVR she was placed on Eliquis, Amiodarone, and Diltiazem. She has been having episodes of dizziness and pre-syncope.  On the day of admission she passed out and was [facility].    Echocardiogram reveals EF 40-45%, moderate TR, PAS 59 mm Hg  Telemetry this admission has demonstrated afib with RVR with post termination pauses of up to 4.2 seconds as well as junctional rhythm. She has been pre-syncopal with her pauses.   EP has been asked to evaluate for treatment options   Past Medical History  Diagnosis Date  . Stroke   . Atrial fibrillation      Surgical History:  Past Surgical History  Procedure Laterality Date  . Radiology with anesthesia N/A 09/24/2013    Procedure: RADIOLOGY WITH ANESTHESIA;  Surgeon: Oneal GroutSanjeev K Deveshwar, MD;  Location: [facility] OR;  Service: Radiology;  Laterality: N/A;     Prescriptions prior to admission  Medication Sig Dispense Refill  . Calcium-Vitamin D (CALTRATE 600 PLUS-VIT D PO) Take 1 tablet by mouth daily.      . cholecalciferol (VITAMIN D) 1000 UNITS tablet Take 1,000 Units by mouth daily.      . digoxin (LANOXIN) 0.125 MG tablet Take 0.125 mg by mouth daily.      Marland Kitchen. levothyroxine (SYNTHROID, LEVOTHROID) 75 MCG tablet Take 75 mcg by mouth daily before breakfast.      . Misc Natural Products (OSTEO BI-FLEX TRIPLE STRENGTH PO) Take 1 tablet by  mouth daily.      . Multiple Vitamins-Minerals (CENTRUM SILVER ULTRA WOMENS) TABS Take 1 tablet by mouth daily.      . ondansetron (ZOFRAN-ODT) 4 MG disintegrating tablet Take 4 mg by mouth every 8 (eight) hours as needed for nausea or vomiting.      . traMADol (ULTRAM) 50 MG tablet Take 50 mg by mouth 3 (three) times daily as needed (pain).      . triamterene-hydrochlorothiazide (MAXZIDE-25) 37.5-25 MG per tablet Take 1 tablet by mouth daily.        Inpatient Medications:  . amiodarone  200 mg Oral BID  . antiseptic oral rinse  15 mL Mouth Rinse q12n4p  . apixaban  2.5 mg Oral BID  . atorvastatin  20 mg Oral q1800  . chlorhexidine  15 mL Mouth Rinse BID  . diltiazem  30 mg Oral Q12H  . levothyroxine  75 mcg Oral QAC breakfast    Allergies: No Known Allergies  History   Social History  . Marital Status: Married    Spouse Name: N/A    Number of Children: N/A  . Years of Education: N/A   Occupational History  . Not on file.   Social History Main Topics  . Smoking status: Never Smoker   . Smokeless tobacco: Never Used  . Alcohol Use: Not on file  . Drug Use: Not on file  . Sexual Activity: Not on file   Other  Topics Concern  . Not on file   Social History Narrative  . No narrative on file     History reviewed. No pertinent family history.   Physical Exam: Filed Vitals:   10/31/13 1408 10/31/13 2052 11/01/13 0437 11/01/13 0915  BP: 85/61 102/44 96/48 94/47   Pulse:  67 94 80  Temp:  98.1 F (36.7 C) 98.3 F (36.8 C) 98.2 F (36.8 C)  TempSrc:  Oral Oral Oral  Resp:  18 18 18   Height:      Weight:      SpO2:  98% 98% 90%    GEN- The patient is very elderly and frail appearing, alert and oriented x 3 today.   Head- normocephalic, atraumatic Eyes-  Sclera clear, conjunctiva pink Ears- hearing intact Oropharynx- clear Neck- supple  Lungs- Clear to ausculation bilaterally, normal work of breathing Heart- irregular rate and rhythm, 2/6 SEM LLSB GI- soft, NT,  ND, + BS Extremities- no clubbing, cyanosis, or edema MS- diffuse muscle atrophy Skin- no rash or lesion Psych- euthymic mood, full affect Neuro- strength and sensation are intact   Labs:   Lab Results  Component Value Date   WBC 7.7 10/31/2013   HGB 8.5* 10/31/2013   HCT 25.0* 10/31/2013   MCV 94.3 10/31/2013   PLT 237 10/31/2013    Recent Labs Lab 10/30/13 2319 10/31/13 0700  NA 136*  139 138  K 4.3  4.2 4.4  CL 105  106 106  CO2 21 22  BUN 28*  27* 23  CREATININE 1.13*  1.20* 0.99  CALCIUM 8.3* 7.9*  PROT 5.3*  --   BILITOT <0.2*  --   ALKPHOS 51  --   ALT 19  --   AST 24  --   GLUCOSE 117*  117* 80   Lab Results  Component Value Date   TROPONINI <0.30 10/31/2013    Radiology/Studies: Ct Head Wo Contrast 10/30/2013   CLINICAL DATA:  Code stroke.  EXAM: CT HEAD WITHOUT CONTRAST  TECHNIQUE: Contiguous axial images were obtained from the base of the skull through the vertex without intravenous contrast.  COMPARISON:  10/02/2013.  FINDINGS: Skull and Sinuses:No significant abnormality.  Orbits: No acute abnormality.  Bilateral cataract resection.  Brain: Expected evolution of recent right MCA territory infarction, with resolution of mass effect. Fogging makes the margins of the infarct difficult to distinguish, limiting detection of peri-infarct ischemia. There is hazy high density within the caudate head and upper putamen, petechial hemorrhage versus dystrophic calcification. No hematoma. No hydrocephalus, mass lesion, or shift.  These results were called by telephone at the time of interpretation on 10/30/2013 at 11:17 PM to Dr. Amada Jupiter, who verbally acknowledged these results.  IMPRESSION: 1. No evidence of acute infarct or significant intracranial hemorrhage. 2. Expected evolution of recent right MCA territory infarction. There is either petechial hemorrhage or mineralization within the right caudate and putamen.   Electronically Signed   By: Tiburcio Pea M.D.   On:  10/30/2013 23:20   Dg Chest Port 1 View 10/31/2013   CLINICAL DATA:  Code stroke.  Shortness of breath.  EXAM: PORTABLE CHEST - 1 VIEW  COMPARISON:  10/14/2013  FINDINGS: Mild cardiomegaly. Diffuse interstitial coarsening. No effusion or pneumothorax. Severe right glenohumeral osteoarthritis with chronic rotator cuff causing high riding humeral head.  IMPRESSION: Cardiomegaly and pulmonary edema.   Electronically Signed   By: Tiburcio Pea M.D.   On: 10/31/2013 02:02   TDD:UKGUR rhythm, rate 65, normal intervals  TELEMETRY: atrial  fibrillation with RVR (rates up to 130) with 4 second post termination pauses and junctional rhythm  A/P  1. 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  She requires medical therapy for afib with RVR which is ongoing.  She has had pauses > 4 seconds documented while awake with symptoms.  I would therefore recommend pacemaker implantation at this time.  Risks, benefits, alternatives to pacemaker implantation were discussed in detail with the patient today. The patient understands that the risks include but are not limited to bleeding, infection, pneumothorax, perforation, tamponade, vascular damage, renal failure, MI, stroke, death,  and lead dislodgement and wishes to proceed. We will therefore schedule the procedure at the next available time.  2. afib Continue amioadarone Her chads2vasc score is at least 6.  She should continue long term anticoagulation with eliquis.  3. Nonischemic CM with pulmonary hypertension and tricuspid regurgitation Medical therapy is advised Her overall prognosis is guarded long term.

## 2013-11-01 NOTE — Progress Notes (Signed)
FMTS Attending Note  I personally saw and evaluated the patient. The plan of care was discussed with the resident team. I agree with the assessment and plan as documented by the resident.   1. Syncope/Presyncope - RN notified team of intermittent 3-5 second pauses on telemetry overnight, cardiology has been consulted and inserted pacemaker this afternoon 2. Atrial fibrillation with tachy/brady syndrome - management per cardiology, continue Amiodarone/Diltiazem/Eliquis 3. Left sided weakness with recent CVA - resolved, symptoms likely due to poor cerebral perfusion from #1, continue anticoagulation and Lipitor  Donnella Sham MD

## 2013-11-01 NOTE — Consult Note (Addendum)
ELECTROPHYSIOLOGY CONSULT NOTE    Patient ID: Rodney LangtonFrances Wedig MRN: [id number], DOB/AGE: 1931/04/08 80 y.o.  Admit date: 10/30/2013 Date of Consult: 11/01/2013  Primary Cardiologist: [facility]  Reason for Consultation: symptomatic bradycardia  HPI:  Mrs. Dorothy [name] is a 80 year old female with a past medical history notable for recent stroke and newly diagnosed atrial fibrillation.  She was admitted in December of 2014 with ischemic stroke and then subsequently was [facility] with [facility].  She is currently in rehab recovering.  Since her stroke and new diagnosis of afib with RVR she was placed on Eliquis, Amiodarone, and Diltiazem. She has been having episodes of dizziness and pre-syncope.  On the day of admission she passed out and was [facility].    Echocardiogram reveals EF 40-45%, moderate TR, PAS 59 mm Hg  Telemetry this admission has demonstrated afib with RVR with post termination pauses of up to 4.2 seconds as well as junctional rhythm. She has been pre-syncopal with her pauses.   EP has been asked to evaluate for treatment options   Past Medical History  Diagnosis Date  . Stroke   . Atrial fibrillation      Surgical History:  Past Surgical History  Procedure Laterality Date  . Radiology with anesthesia N/A 09/24/2013    Procedure: RADIOLOGY WITH ANESTHESIA;  Surgeon: Oneal GroutSanjeev K Deveshwar, MD;  Location: [facility] OR;  Service: Radiology;  Laterality: N/A;     Prescriptions prior to admission  Medication Sig Dispense Refill  . Calcium-Vitamin D (CALTRATE 600 PLUS-VIT D PO) Take 1 tablet by mouth daily.      . cholecalciferol (VITAMIN D) 1000 UNITS tablet Take 1,000 Units by mouth daily.      . digoxin (LANOXIN) 0.125 MG tablet Take 0.125 mg by mouth daily.      Marland Kitchen. levothyroxine (SYNTHROID, LEVOTHROID) 75 MCG tablet Take 75 mcg by mouth daily before breakfast.      . Misc Natural Products (OSTEO BI-FLEX TRIPLE STRENGTH PO) Take 1 tablet by  mouth daily.      . Multiple Vitamins-Minerals (CENTRUM SILVER ULTRA WOMENS) TABS Take 1 tablet by mouth daily.      . ondansetron (ZOFRAN-ODT) 4 MG disintegrating tablet Take 4 mg by mouth every 8 (eight) hours as needed for nausea or vomiting.      . traMADol (ULTRAM) 50 MG tablet Take 50 mg by mouth 3 (three) times daily as needed (pain).      . triamterene-hydrochlorothiazide (MAXZIDE-25) 37.5-25 MG per tablet Take 1 tablet by mouth daily.        Inpatient Medications:  . amiodarone  200 mg Oral BID  . antiseptic oral rinse  15 mL Mouth Rinse q12n4p  . apixaban  2.5 mg Oral BID  . atorvastatin  20 mg Oral q1800  . chlorhexidine  15 mL Mouth Rinse BID  . diltiazem  30 mg Oral Q12H  . levothyroxine  75 mcg Oral QAC breakfast    Allergies: No Known Allergies  History   Social History  . Marital Status: Married    Spouse Name: N/A    Number of Children: N/A  . Years of Education: N/A   Occupational History  . Not on file.   Social History Main Topics  . Smoking status: Never Smoker   . Smokeless tobacco: Never Used  . Alcohol Use: Not on file  . Drug Use: Not on file  . Sexual Activity: Not on file   Other  Topics Concern  . Not on file   Social History Narrative  . No narrative on file     She denies any significant family history including hypertension  Physical Exam: Filed Vitals:   10/31/13 1408 10/31/13 2052 11/01/13 0437 11/01/13 0915  BP: 85/61 102/44 96/48 94/47   Pulse:  67 94 80  Temp:  98.1 F (36.7 C) 98.3 F (36.8 C) 98.2 F (36.8 C)  TempSrc:  Oral Oral Oral  Resp:  18 18 18   Height:      Weight:      SpO2:  98% 98% 90%    GEN- The patient is very elderly and frail appearing, alert and oriented x 3 today.   Head- normocephalic, atraumatic Eyes-  Sclera clear, conjunctiva pink Ears- hearing intact Oropharynx- clear Neck- supple  Lungs- Clear to ausculation bilaterally, normal work of breathing Heart- irregular rate and rhythm, 2/6 SEM  LLSB GI- soft, NT, ND, + BS Extremities- no clubbing, cyanosis, or edema MS- diffuse muscle atrophy Skin- no rash or lesion Psych- euthymic mood, full affect Neuro- strength and sensation are intact   Labs:   Lab Results  Component Value Date   WBC 7.7 10/31/2013   HGB 8.5* 10/31/2013   HCT 25.0* 10/31/2013   MCV 94.3 10/31/2013   PLT 237 10/31/2013    Recent Labs Lab 10/30/13 2319 10/31/13 0700  NA 136*  139 138  K 4.3  4.2 4.4  CL 105  106 106  CO2 21 22  BUN 28*  27* 23  CREATININE 1.13*  1.20* 0.99  CALCIUM 8.3* 7.9*  PROT 5.3*  --   BILITOT <0.2*  --   ALKPHOS 51  --   ALT 19  --   AST 24  --   GLUCOSE 117*  117* 80   Lab Results  Component Value Date   TROPONINI <0.30 10/31/2013    Radiology/Studies: Ct Head Wo Contrast 10/30/2013   CLINICAL DATA:  Code stroke.  EXAM: CT HEAD WITHOUT CONTRAST  TECHNIQUE: Contiguous axial images were obtained from the base of the skull through the vertex without intravenous contrast.  COMPARISON:  10/02/2013.  FINDINGS: Skull and Sinuses:No significant abnormality.  Orbits: No acute abnormality.  Bilateral cataract resection.  Brain: Expected evolution of recent right MCA territory infarction, with resolution of mass effect. Fogging makes the margins of the infarct difficult to distinguish, limiting detection of peri-infarct ischemia. There is hazy high density within the caudate head and upper putamen, petechial hemorrhage versus dystrophic calcification. No hematoma. No hydrocephalus, mass lesion, or shift.  These results were called by telephone at the time of interpretation on 10/30/2013 at 11:17 PM to Dr. Amada JupiterKirkpatrick, who verbally acknowledged these results.  IMPRESSION: 1. No evidence of acute infarct or significant intracranial hemorrhage. 2. Expected evolution of recent right MCA territory infarction. There is either petechial hemorrhage or mineralization within the right caudate and putamen.   Electronically Signed   By: Tiburcio PeaJonathan   Watts M.D.   On: 10/30/2013 23:20   Dg Chest Port 1 View 10/31/2013   CLINICAL DATA:  Code stroke.  Shortness of breath.  EXAM: PORTABLE CHEST - 1 VIEW  COMPARISON:  10/14/2013  FINDINGS: Mild cardiomegaly. Diffuse interstitial coarsening. No effusion or pneumothorax. Severe right glenohumeral osteoarthritis with chronic rotator cuff causing high riding humeral head.  IMPRESSION: Cardiomegaly and pulmonary edema.   Electronically Signed   By: Tiburcio PeaJonathan  Watts M.D.   On: 10/31/2013 02:02   ZOX:WRUEAEKG:sinus rhythm, rate 65, normal intervals  TELEMETRY:  atrial fibrillation with RVR (rates up to 130) with 4 second post termination pauses and junctional rhythm  A/P  1. 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  She requires medical therapy for afib with RVR which is ongoing.  She has had pauses > 4 seconds documented while awake with symptoms.  I would therefore recommend pacemaker implantation at this time.  Risks, benefits, alternatives to pacemaker implantation were discussed in detail with the patient today. The patient understands that the risks include but are not limited to bleeding, infection, pneumothorax, perforation, tamponade, vascular damage, renal failure, MI, stroke, death,  and lead dislodgement and wishes to proceed. We will therefore schedule the procedure at the next available time.  2. afib Continue amioadarone Her chads2vasc score is at least 6.  She should continue long term anticoagulation with eliquis.  3. Nonischemic CM with pulmonary hypertension and tricuspid regurgitation Medical therapy is advised Her overall prognosis is guarded long term.

## 2013-11-01 NOTE — Interval H&P Note (Signed)
History and Physical Interval Note:  11/01/2013 2:32 PM  Dorothy [name]  has presented today for surgery, with the diagnosis of snycope  The various methods of treatment have been discussed with the patient and family. After consideration of risks, benefits and other options for treatment, the patient has consented to  Procedure(s): PERMANENT PACEMAKER INSERTION (N/A) as a surgical intervention .  The patient's history has been reviewed, patient examined, no change in status, stable for surgery.  I have reviewed the patient's chart and labs.  Questions were answered to the patient's satisfaction.     Hillis Range

## 2013-11-01 NOTE — Clinical Documentation Improvement (Signed)
"  Systolic CHF" documented in H&P and progress notes this admission.  Based on your independent clinical judgement, please document the ACUITY of the patient's systolic heart failure this admission or clarify if Systolic Heart Failure is an applicable diagnosis for this patient based on studies and evaluations this admission.   Thank You, Jerral Ralph ,RN BSN CCDS Certified Clinical Documentation Specialist [phone] [facility]- Health Information Management

## 2013-11-01 NOTE — Progress Notes (Signed)
Patient's is currently has blood in her urine.MD notified. Will continue to monitor closely. Lajuana Matte, RN

## 2013-11-01 NOTE — Discharge Summary (Signed)
Family Medicine Teaching [facility] Discharge Summary  Patient name: Dorothy [name] Medical record number: [id number] Date of birth: 1931-03-31 Age: 80 y.o. Gender: female Date of Admission: 10/30/2013  Date of Discharge: 11/02/2013 Admitting Physician: Janit Pagan, MD  Primary Care Provider: Provider Not In System Consultants: Neurology, Cardiology  Indication for Hospitalization: Syncope   Discharge Diagnoses/Problem List:  Patient Active Problem List   Diagnosis Date Noted  . Pulmonary HTN 11/02/2013  . HFrEF (heart failure with reduced ejection fraction) 11/02/2013  . Tachycardia-bradycardia syndrome 11/01/2013  . Weakness 10/31/2013  . Atrial fibrillation 10/03/2013  . Dysphagia, pharyngoesophageal phase 10/03/2013  . High blood pressure 10/03/2013  . Thyroid disease 10/03/2013  . Acute respiratory failure with hypoxia 09/25/2013  . Cerebral infarction due to embolism of right middle cerebral artery, s/p tPa with clot retrieval 09/24/2013    Disposition: Discharged back to [facility]  Discharge Condition: Stable  Discharge Exam:  BP 101/57  Pulse 95  Temp(Src) 98 F (36.7 C) (Oral)  Resp 18  Ht 5\' 7"  (1.702 m)  Wt 97 lb 14.2 oz (44.4 kg)  BMI 15.33 kg/m2  SpO2 99% Gen: Elderly  80 y.o.female in NAD HEENT: MMM, posterior oropharynx clear Pulm: Non-labored; CTAB, no wheezes  CV: Regular rate, no murmur appreciated; distal pulses intact/symmetric GI: +BS; soft, non-tender, non-distended Skin: No rashes, wounds, ulcers Neuro: Alert and interactive, not oriented to time or place, L-sided neglect  Brief Hospital Course:  Pt admitted on 10/30/2013 for presyncopal episode. Her PMHx is significant for 2 recent significant hospitalizations including for a ischemic stroke in early December of 2014 at [facility] and a subsequent admission at [facility] for an NSTEMI. She has been currently living in a SNF undergoing therapy.   A head CT was  negative for acute findings. Neurology consultant assessed that this was not primarily neurological in origin and recommended the continuation of eliquis for secondary stroke prevention. Blood pressure was not orthostatic, cardiac enzymes were negative, but there were many telemetric events to suggest a cardiac etiology including >4 sec pauses prompting cardiology consultation. On 1/13 a pacemaker was implanted. Her medications were changed with her being discharged on metoprolol 50mg  daily, with the possibility to titrate up as tolerated, and diltiazem. Of note, digoxin was reported as a home medicine on the day of discharge and is being discontinued given good rate control on the above medications. A 2D echocardiogram was performed on 1/13 and showed decreased EF (40-45%) and grade 2 diastolic dysfunction. She experienced no subsequent dizziness events and good rate-control, so she is cleared for discharge.    Home medications were continued, including amiodarone, eliquis, lipitor, cardizem, synthroid, and ultram.   Issues for Follow Up:  - Continued monitoring for cardiac events. Has appointment for cardiology follow up.   Significant Procedures: Pacemaker implantation 11/01/13  Significant Labs and Imaging:   Recent Labs Lab 10/30/13 2319 10/31/13 0700 11/02/13 0417  WBC 8.1 7.7 7.3  HGB 9.2*  9.5* 8.5* 8.7*  HCT 28.2*  28.0* 25.0* 26.4*  PLT 261 237 298    Recent Labs Lab 10/30/13 2319 10/31/13 0700 11/02/13 0417  NA 136*  139 138 137  K 4.3  4.2 4.4 4.2  CL 105  106 106 105  CO2 21 22 20   GLUCOSE 117*  117* 80 110*  BUN 28*  27* 23 12  CREATININE 1.13*  1.20* 0.99 0.86  CALCIUM 8.3* 7.9* 8.6  ALKPHOS 51  --   --  AST 24  --   --   ALT 19  --   --   ALBUMIN 2.4*  --   --     10/31/2013 07:00  TSH 5.195 (H)      Recent Labs Lab 10/31/13 0700 10/31/13 1120 10/31/13 1745  TROPONINI <0.30 <0.30 <0.30   Portable CXR FINDINGS:  Mild cardiomegaly. Diffuse  interstitial coarsening. No effusion or  pneumothorax. Severe right glenohumeral osteoarthritis with chronic  rotator cuff causing high riding humeral head.  IMPRESSION:  Cardiomegaly and pulmonary edema.  2D ECHO - Left ventricle: The cavity size was normal. Wall thickness was normal. Systolic function was mildly to moderately reduced. The estimated ejection fraction was in the range of 40% to 45%. Diffuse hypokinesis. Features are consistent with a pseudonormal left ventricular filling pattern, with concomitant abnormal relaxation and increased filling pressure (grade 2 diastolic dysfunction). Doppler parameters are consistent with high ventricular filling pressure. - Mitral valve: Calcified annulus. Mildly thickened leaflets . Moderate regurgitation. - Left atrium: The atrium was mildly dilated. - Tricuspid valve: Moderate regurgitation. - Pulmonary arteries: Systolic pressure was moderately to severely increased. PA peak pressure: 59mm Hg (S). - Pericardium, extracardiac: A trivial pericardial effusion was identified.  Results/Tests Pending at Time of Discharge: None  Discharge Medications:    Medication List    STOP taking these medications       triamterene-hydrochlorothiazide 37.5-25 MG per tablet  Commonly known as:  MAXZIDE-25      TAKE these medications       amiodarone 200 MG tablet  Commonly known as:  PACERONE  Take 1 tablet (200 mg total) by mouth 2 (two) times daily.     apixaban 2.5 MG Tabs tablet  Commonly known as:  ELIQUIS  Take 1 tablet (2.5 mg total) by mouth 2 (two) times daily.     CALTRATE 600 PLUS-VIT D PO  Take 1 tablet by mouth daily.     CENTRUM SILVER ULTRA WOMENS Tabs  Take 1 tablet by mouth daily.     cholecalciferol 1000 UNITS tablet  Commonly known as:  VITAMIN D  Take 1,000 Units by mouth daily.     digoxin 0.125 MG tablet  Commonly known as:  LANOXIN  Take 0.125 mg by mouth daily.     levothyroxine 75 MCG tablet   Commonly known as:  SYNTHROID, LEVOTHROID  Take 75 mcg by mouth daily before breakfast.     metoprolol succinate 50 MG 24 hr tablet  Commonly known as:  TOPROL-XL  Take 1 tablet (50 mg total) by mouth daily. Take with or immediately following a meal.     ondansetron 4 MG disintegrating tablet  Commonly known as:  ZOFRAN-ODT  Take 4 mg by mouth every 8 (eight) hours as needed for nausea or vomiting.     OSTEO BI-FLEX TRIPLE STRENGTH PO  Take 1 tablet by mouth daily.     traMADol 50 MG tablet  Commonly known as:  ULTRAM  Take 50 mg by mouth 3 (three) times daily as needed (pain).        Discharge Instructions: Please refer to Patient Instructions section of EMR for full details.  Patient was counseled important signs and symptoms that should prompt return to medical care, changes in medications, dietary instructions, activity restrictions, and follow up appointments.   Follow-Up Appointments: Follow-up Information   [facility] On 11/14/2013. (At 11:30 AM for wound check)    Specialty:  Cardiology   Contact  information:   [address], [address] [place] [address] [phone]      Follow up with Hillis Range, MD In 3 months. (Our office will call you with your appointment date and time)    Specialty:  Cardiology   Contact information:   [address] [place] [address] [phone]       Hazeline Junker, MD 11/02/2013, 12:29 PM PGY-1, [facility]

## 2013-11-01 NOTE — Op Note (Signed)
SURGEON:  Hillis Range, MD     PREPROCEDURE DIAGNOSIS:  Symptomatic sinus bradycardia, tachycardia/ bradycardia syndrome/ paroxysmal atrial fibrillation    POSTPROCEDURE DIAGNOSIS:  Symptomatic sinus bradycardia, tachycardia/ bradycardia syndrome/ paroxysmal atrial fibrillation     PROCEDURES:   1. Left upper extremity venography.   2. Pacemaker implantation.     INTRODUCTION: Dorothy [name] is a 80 y.o. female  with a history of symptomatic tachycardia/ bradycardia who presents today for pacemaker implantation.  The patient reports intermittent episodes of dizziness over the past few days.  She has afib with RVR requiring AV nodal agents long term.  No reversible causes have been identified.  The patient therefore presents today for pacemaker implantation.     DESCRIPTION OF PROCEDURE:  Informed written consent was obtained, and the patient was brought to the electrophysiology lab in a fasting state.  The patient required no sedation for the procedure today.  The patients left chest was prepped and draped in the usual sterile fashion by the EP lab staff. The skin overlying the left deltopectoral region was infiltrated with lidocaine for local analgesia.  A 4-cm incision was made over the left deltopectoral region.  A left subcutaneous pacemaker pocket was fashioned using a combination of sharp and blunt dissection. Electrocautery was required to assure hemostasis.    Left Upper Extremity Venography: A venogram of the left upper extremity was performed, which revealed a large left cephalic vein, which emptied into a large left subclavian vein.  The left axillary vein was moderate in size.    RA/RV Lead Placement: The left axillary vein was cannulated.  Through the left axillary vein, a Medtronic model [id number] (serial number [id number]) right atrial lead and a Medtronic model 5092- 58 (serial number [id number]) right ventricular lead were advanced with fluoroscopic visualization into the right  atrial appendage and right ventricular apex positions respectively.  She presented today in junctional rhythm.  Due to atriopathy, extensive atrial mapping was required in order to achieve adequate atrial sensing/ pacing.  Finally, atrial lead P- waves measured 1.9 mV with impedance of 395 ohms and a threshold of 0.6 V at 0.5 msec.  Right ventricular lead R-waves measured 5.7 mV with an impedance of 533 ohms and a threshold of 0.3 V at 0.5 msec.  Both leads were secured to the pectoralis fascia using #2-0 silk over the suture sleeves.   Device Placement:  The leads were then connected to a Medtronic Adams model [id number] (serial number [id number]) pacemaker.  The pocket was irrigated with copious gentamicin solution.  The pacemaker was then placed into the pocket.  The pocket was then closed in 2 layers with 2.0 Vicryl suture for the subcutaneous and subcuticular layers.  Steri- Strips and a sterile dressing were then applied.  There were no early apparent complications.     CONCLUSIONS:   1. Successful implantation of a Medtronic Sensia dual-chamber pacemaker for symptomatic tachycardia/ bradycardia syndrome  2. No early apparent complications.           Hillis Range, MD 11/01/2013 4:40 PM

## 2013-11-01 NOTE — Progress Notes (Signed)
Paged by nurse that patient had blood in her urine. She produced about 200 cc of urine and it was described as a young person on her menses. She is anticoagulated with eliquis and had a foley removed this morning. She denied any pain.  She went for a pacemaker placement today.  During exam there was no blood noticed. She had normal female genitalia. Will order a CBC to check Hgb. Notified nurse to page me if more blood is noticed.   Myra Rude, MD PGY-1, [facility] 11/01/2013, 8:08 PM FPTS Service pager: [phone] (text pages welcome through [facility])

## 2013-11-01 NOTE — Evaluation (Signed)
Clinical/Bedside Swallow Evaluation Patient Details  Name: Moeisha Woodling MRN: [id number] Date of Birth: 09/08/1931  Today's Date: 11/01/2013 Time: 2244-9753 SLP Time Calculation (min): 18 min  Past Medical History:  Past Medical History  Diagnosis Date  . Stroke   . Atrial fibrillation    Past Surgical History:  Past Surgical History  Procedure Laterality Date  . Radiology with anesthesia N/A 09/24/2013    Procedure: RADIOLOGY WITH ANESTHESIA;  Surgeon: Oneal Grout, MD;  Location: [facility] OR;  Service: Radiology;  Laterality: N/A;   HPI:  Naporsha Pulsipher is a 80 y.o. year old female who expressed a presyncopal episode after returning from the bathroom earlier this evening.  She was found to have significant worsening of her left-sided weakness as residual from her stroke in December.  There is report of entire left sided flaccidity was significantly improved upon evaluation in emergency department.  She has been doing well at therapy and his progress to walking on her own.   Pt underwent MBS during December admission (09/24/13), which revealed mod-severe dysphagia with silent aspiration.  D/Cd from [facility] to SNF on a restrictive dysphagia 1, honey-thick liquid diet.     Assessment / Plan / Recommendation Clinical Impression  Pt known to SLP services- presents with persisting, but improved, dysphagia since December admission.  Pt alert, confused, asking repetitive questions and demonstrates perseverative motor behaviors.   Hx of significant pharyngeal dysphagia.  Today, presents with improved attention to bolus, improved timeliness of swallow, and likely improved hyolaryngeal function.  Recommend Dysphagia 1, honey-thick liquids and MBS to evaluate readiness for diet liberalization.    PLEASE ORDER MBS.    Aspiration Risk  Moderate    Diet Recommendation Dysphagia 1 (Puree);Honey-thick liquid   Liquid Administration via: Cup Medication Administration: Whole meds with  puree Supervision: Patient able to self feed;Full supervision/cueing for compensatory strategies Compensations: Slow rate;Small sips/bites Postural Changes and/or Swallow Maneuvers: Chin tuck;Seated upright 90 degrees    Other  Recommendations Recommended Consults: MBS Oral Care Recommendations: Oral care BID Other Recommendations: Order thickener from pharmacy   Follow Up Recommendations  Skilled Nursing facility      Swallow Study Prior Functional Status       General HPI: Georgetta Simar is a 80 y.o. year old female who expressed a presyncopal episode after returning from the bathroom earlier this evening.  She was found to have significant worsening of her left-sided weakness as residual from her stroke in December.  There is report of entire left sided flaccidity was significantly improved upon evaluation in emergency department.  She has been doing well at therapy and his progress to walking on her own.   Pt underwent MBS during December admission (09/24/13), which revealed mod-severe dysphagia with silent aspiration.  D/Cd from [facility] to SNF on a restrictive dysphagia 1, honey-thick liquid diet.   Type of Study: Bedside swallow evaluation Previous Swallow Assessment: MBS 12/6 Diet Prior to this Study: Dysphagia 3 (soft) Temperature Spikes Noted: No Respiratory Status: Room air Behavior/Cognition: Alert;Confused Oral Cavity - Dentition: Edentulous;Dentures, not available Self-Feeding Abilities: Able to feed self Patient Positioning: Upright in bed Baseline Vocal Quality: Clear Volitional Cough: Strong Volitional Swallow: Able to elicit    Oral/Motor/Sensory Function Overall Oral Motor/Sensory Function: Impaired at baseline (mild asymmetry CN VII on left)   Ice Chips Ice chips: Not tested   Thin Liquid Thin Liquid: Not tested    Nectar Thick Nectar Thick Liquid: Impaired Presentation: Cup Pharyngeal Phase Impairments: Cough - Delayed   Honey  Thick Honey Thick Liquid: Within  functional limits Presentation: Cup;Self fed   Puree Puree: Within functional limits Presentation: Self Fed;Spoon   Solid   Miller Limehouse L. Fargoouture, [place] CCC/SLP Pager [phone]     Solid: Not tested       Blenda MountsCouture, Damain Broadus Laurice 11/01/2013,11:48 AM

## 2013-11-02 ENCOUNTER — Encounter: Payer: Self-pay | Admitting: Internal Medicine

## 2013-11-02 ENCOUNTER — Inpatient Hospital Stay (HOSPITAL_COMMUNITY): Payer: Medicare Other

## 2013-11-02 ENCOUNTER — Telehealth: Payer: Self-pay | Admitting: Internal Medicine

## 2013-11-02 DIAGNOSIS — I5020 Unspecified systolic (congestive) heart failure: Secondary | ICD-10-CM | POA: Diagnosis present

## 2013-11-02 DIAGNOSIS — I272 Pulmonary hypertension, unspecified: Secondary | ICD-10-CM | POA: Diagnosis present

## 2013-11-02 DIAGNOSIS — I509 Heart failure, unspecified: Secondary | ICD-10-CM

## 2013-11-02 DIAGNOSIS — I10 Essential (primary) hypertension: Secondary | ICD-10-CM

## 2013-11-02 LAB — BASIC METABOLIC PANEL
BUN: 12 mg/dL (ref 6–23)
CHLORIDE: 105 meq/L (ref 96–112)
CO2: 20 mEq/L (ref 19–32)
Calcium: 8.6 mg/dL (ref 8.4–10.5)
Creatinine, Ser: 0.86 mg/dL (ref 0.50–1.10)
GFR calc non Af Amer: 62 mL/min — ABNORMAL LOW (ref >90)
GFR, EST AFRICAN AMERICAN: 72 mL/min — AB (ref >90)
GLUCOSE: 110 mg/dL — AB (ref 70–99)
POTASSIUM: 4.2 meq/L (ref 3.7–5.3)
Sodium: 137 mEq/L (ref 137–147)

## 2013-11-02 LAB — CBC
HEMATOCRIT: 26.4 % — AB (ref 36.0–46.0)
HEMOGLOBIN: 8.7 g/dL — AB (ref 12.0–15.0)
MCH: 31.3 pg (ref 26.0–34.0)
MCHC: 33 g/dL (ref 30.0–36.0)
MCV: 95 fL (ref 78.0–100.0)
Platelets: 298 10*3/uL (ref 150–400)
RBC: 2.78 MIL/uL — ABNORMAL LOW (ref 3.87–5.11)
RDW: 17.1 % — ABNORMAL HIGH (ref 11.5–15.5)
WBC: 7.3 10*3/uL (ref 4.0–10.5)

## 2013-11-02 MED ORDER — APIXABAN 2.5 MG PO TABS: 2.5000 mg | ORAL_TABLET | Freq: Two times a day (BID) | ORAL | Status: AC

## 2013-11-02 MED ORDER — METOPROLOL SUCCINATE ER 50 MG PO TB24: 50.0000 mg | ORAL_TABLET | Freq: Every day | ORAL | Status: DC

## 2013-11-02 MED ORDER — AMIODARONE HCL 200 MG PO TABS
200.0000 mg | ORAL_TABLET | Freq: Two times a day (BID) | ORAL | Status: DC
Start: 2013-11-02 — End: 2013-11-09

## 2013-11-02 MED ORDER — TRAMADOL HCL 50 MG PO TABS: 50.0000 mg | ORAL_TABLET | Freq: Three times a day (TID) | ORAL | Status: DC | PRN

## 2013-11-02 MED ORDER — METOPROLOL SUCCINATE ER 50 MG PO TB24
50.0000 mg | ORAL_TABLET | Freq: Every day | ORAL | Status: DC
  Administered 2013-11-02: 50 mg via ORAL
  Filled 2013-11-02: qty 1

## 2013-11-02 NOTE — Progress Notes (Addendum)
1:01pm: CSW received call from [place] at [facility], stating a bed has come open and they can take patient today. CSW will notify patient and patient's husband of move to SNF today.  CSW received phone call from patient's former SNF, [facility], stating that patient is not able to return back there upon discharge because family did not pay privately to secure her room and now they do not have availability. CSW will inform patient and patient's husband of this and will fax patient out to look for other SNF.  Maree Krabbe, MSW, [facility]ors [phone]

## 2013-11-02 NOTE — Progress Notes (Signed)
SUBJECTIVE: The patient is doing well today.  At this time, she denies chest pain, shortness of breath, or any new concerns.  She has had no further pre-syncopal spells since pacer placed yesterday.  Minimal incisional soreness.  CURRENT MEDICATIONS: . amiodarone  200 mg Oral BID  . antiseptic oral rinse  15 mL Mouth Rinse q12n4p  . apixaban  2.5 mg Oral BID  . atorvastatin  20 mg Oral q1800  .  ceFAZolin (ANCEF) IV  1 g Intravenous Q6H  . chlorhexidine  15 mL Mouth Rinse BID  . diltiazem  30 mg Oral Q12H  . levothyroxine  75 mcg Oral QAC breakfast  . sodium chloride  3 mL Intravenous Q12H      OBJECTIVE: Physical Exam: Filed Vitals:   11/01/13 1817 11/01/13 1914 11/01/13 2229 11/02/13 0337  BP: 123/57 122/49 115/49 114/53  Pulse: 65 68 66 68  Temp:    98.6 F (37 C)  TempSrc:    Oral  Resp: 18 18 18 18   Height:      Weight:      SpO2: 98%   97%    Intake/Output Summary (Last 24 hours) at 11/02/13 2025 Last data filed at 11/02/13 0430  Gross per 24 hour  Intake    480 ml  Output   1400 ml  Net   -920 ml    Telemetry reveals atrial fibrillation, ventricular rates 90-110, periods of ventricular pacing, occasional atrial undersensing with ventricular safety pacing  GEN- The patient is quite frail appearing, alert and oriented x 3 today.   Head- normocephalic, atraumatic Eyes-  Sclera clear, conjunctiva pink Ears- hearing intact Oropharynx- clear Neck- supple,   Lungs- Clear to ausculation bilaterally, normal work of breathing Heart- Regular rate and rhythm  GI- soft, NT, ND, + BS Extremities- no clubbing, cyanosis, or edema Skin- pacemaker site looks good Psych- euthymic mood, full affect Neuro- strength and sensation are intact  LABS: Basic Metabolic Panel:  Recent Labs  42/70/62 0700 11/02/13 0417  NA 138 137  K 4.4 4.2  CL 106 105  CO2 22 20  GLUCOSE 80 110*  BUN 23 12  CREATININE 0.99 0.86  CALCIUM 7.9* 8.6   Liver Function  Tests:  Recent Labs  10/30/13 2319  AST 24  ALT 19  ALKPHOS 51  BILITOT <0.2*  PROT 5.3*  ALBUMIN 2.4*   CBC:  Recent Labs  10/30/13 2319 10/31/13 0700 11/02/13 0417  WBC 8.1 7.7 7.3  NEUTROABS 5.7  --   --   HGB 9.2*  9.5* 8.5* 8.7*  HCT 28.2*  28.0* 25.0* 26.4*  MCV 96.2 94.3 95.0  PLT 261 237 298   Cardiac Enzymes:  Recent Labs  10/31/13 0700 10/31/13 1120 10/31/13 1745  TROPONINI <0.30 <0.30 <0.30   Thyroid Function Tests:  Recent Labs  10/31/13 0700  TSH 5.195*    RADIOLOGY:  CXR this morning - official read pending, leads in stable position  Device interrogation Normal device function, P waves 0.7, FFRW, thresholds stable from implant.  See paper chart.   EKG - sinus rhythm rate 64, normal intervals, occasional atrial undersensing with ventricular safety pacing  ASSESSMENT AND PLAN:  Active Problems:   Atrial fibrillation   Weakness   Tachycardia-bradycardia syndrome  1. Tachycardia/ bradycardia syndrome Device interrogation is reviewed today.  Her AV timing is challenging, particularly with low p waves and far field R waves.  I have elected to reprogram VVI 50 to prevent post termination pauses.  We can reassess in the office.  CXR is without pneumothorax  2. afib Continue amiodarone and eliquis Convert cardizem to toprol xl 50mg  daily and titrate as needed  3. At least moderate TR with EF 40% Rate control afib and reassess in the office. I think that long term, her prognosis is poor Add beta blocker BP is too soft for ace inhibitor.  This could be added in the office once afib rates are better controlled.  Transition of care appointment with Tereso NewcomerScott Weaver Routine device wound care and follow-up OK to discharge from CV standpoint Call with questions

## 2013-11-02 NOTE — Discharge Summary (Signed)
I agree with the discharge summary as documented.   Katieann Hungate MD  

## 2013-11-02 NOTE — Telephone Encounter (Signed)
TOC PT 7 DAYS APPT WITH LORI 11-09-13 AT 10A/MT

## 2013-11-02 NOTE — Progress Notes (Signed)
IV removal tolerated well with no S/S of infection.  RN called [facility] to give [place], RN report.  RN answered questions to the best of her abilities.  RN awaiting ambulance for transport.  Will continue to monitor.

## 2013-11-02 NOTE — Progress Notes (Signed)
Family Medicine Teaching Service Daily Progress Note Intern Pager: [phone]  Patient name: Dorothy [name] Medical record number: [id number] Date of birth: 04-12-1931 Age: 80 y.o. Gender: female  Primary Care Provider: Provider Not In System Consultants: Electrophysiology Code Status: DNR  Pt Overview and Major Events to Date:  1/11: Admitted for syncope work up after CVA ruled out 1/13: Echo, pacemaker placement  Assessment and Plan: Pt admitted on 10/30/2013 for presyncopal episode. Her PMHx is significant for recent ischemic stroke in early December of 2014 and a subsequent admission for an NSTEMI. Stroke ruled out at admission.   Syncope/Presyncope, s/p CVA with left-sided hemiparesis: Secondary to tachy/brady syndrome and paroxysmal AFib w/RVR.  - CT head negative, no need to repeat recent stroke work up - continue eliquis, atorvastatin  - Dysphagia 3 diet with nectar thickened liquids per SLP recommendation   Cardiac: Recent NSTEMI, Systolic CHF, A Fib w/RVR and tachycardia/bradycardia syndrome - POD #1 s/p pacemaker implantation - continue amiodarone and diltiazem  - CE's negative - Continue Eliquis long term - 2D Echocardiogram: Reduced EF 40-45%, grade 2 diastolic dysfunction, pulmonary arterial hypertenson - Cardiology consultation appreciated  Hypothyroidism: TSH 5.195 (hypothyroidism with insufficient dose vs. acute elevation. Not source of hospital problem. - continue synthroid , revisit this as outpatient   PROPHYLAXIS: Eliquis  DIET: Dysphagia 3 - nectar thick liquids  Disposition: Continued monitoring, may be clear for discharge pending cardiology recommendation.  Subjective: Pt without complaints of pain at this time, Denies CP, COB, dizziness (though she has not gotten out of bed overnight), palpitations. Minimal incisional tenderness.   Objective: Temp:  [98.2 F (36.8 C)-98.6 F (37 C)] 98.6 F (37 C) (01/14 0337) Pulse Rate:  [46-80] 68 (01/14  0337) Resp:  [16-18] 18 (01/14 0337) BP: (92-123)/(41-57) 114/53 mmHg (01/14 0337) SpO2:  [90 %-100 %] 97 % (01/14 1155) Physical Exam: Gen: Elderly 80 y.o.female in NAD  HEENT: MMM, posterior oropharynx clear  Pulm: Non-labored; CTAB, no wheezes  CV: Irregularly irregular, HR <100 , no murmur appreciated; distal pulses intact/symmetric  GI: +BS; soft, non-tender, non-distended  Skin: No rashes, wounds, ulcers  Neuro: Alert and oriented to person and place, conversant, L-sided neglect  Laboratory:  Recent Labs Lab 10/30/13 2319 10/31/13 0700 11/02/13 0417  WBC 8.1 7.7 7.3  HGB 9.2*  9.5* 8.5* 8.7*  HCT 28.2*  28.0* 25.0* 26.4*  PLT 261 237 298    Recent Labs Lab 10/30/13 2319 10/31/13 0700 11/02/13 0417  NA 136*  139 138 137  K 4.3  4.2 4.4 4.2  CL 105  106 106 105  CO2 21 22 20   BUN 28*  27* 23 12  CREATININE 1.13*  1.20* 0.99 0.86  CALCIUM 8.3* 7.9* 8.6  PROT 5.3*  --   --   BILITOT <0.2*  --   --   ALKPHOS 51  --   --   ALT 19  --   --   AST 24  --   --   GLUCOSE 117*  117* 80 110*   10/31/2013 07:00   TSH  5.195 (H)    Recent Labs  Lab  10/31/13 0700  10/31/13 1120  10/31/13 1745   TROPONINI  <0.30  <0.30  <0.30    Portable CXR 1/12 FINDINGS:  Mild cardiomegaly. Diffuse interstitial coarsening. No effusion or  pneumothorax. Severe right glenohumeral osteoarthritis with chronic  rotator cuff causing high riding humeral head.  IMPRESSION:  Cardiomegaly and pulmonary edema.  2D ECHO - Left ventricle: The  cavity size was normal. Wall thickness was normal. Systolic function was mildly to moderately reduced. The estimated ejection fraction was in the range of 40% to 45%. Diffuse hypokinesis. Features are consistent with a pseudonormal left ventricular filling pattern, with concomitant abnormal relaxation and increased filling pressure (grade 2 diastolic dysfunction). Doppler parameters are consistent with high ventricular filling  pressure. - Mitral valve: Calcified annulus. Mildly thickened leaflets . Moderate regurgitation. - Left atrium: The atrium was mildly dilated. - Tricuspid valve: Moderate regurgitation. - Pulmonary arteries: Systolic pressure was moderately to severely increased. PA peak pressure: 59mm Hg (S). - Pericardium, extracardiac: A trivial pericardial effusion was identified.  2vw CXR 1/14 FINDINGS:  Interval placement of a left-sided pacemaker with continuous leads.  Stable cardiac silhouette. There is some improvement in the  interstitial edema pattern seen on prior. Small left effusion and  basilar atelectasis is present.  IMPRESSION:  1. No complication following pacer placement.  2. Improvement in interstitial edema pattern.  Hazeline Junkeryan Grunz, MD 11/02/2013, 8:21 AM PGY-1, [facility] FPTS Intern pager: [phone], text pages welcome

## 2013-11-02 NOTE — Progress Notes (Signed)
Clinical Social Work Department CLINICAL SOCIAL WORK PLACEMENT NOTE 11/02/2013  Patient:  Durfee,Jilliann  Account Number:  [id number] Admit date:  10/30/2013  Clinical Social Worker:  Carren Rang  Date/time:  11/02/2013 02:50 PM  Clinical Social Work is seeking post-discharge placement for this patient at the following level of care:   SKILLED NURSING   (*CSW will update this form in Epic as items are completed)   11/02/2013  Patient/family provided with [facility] Department of Clinical Social Work's list of facilities offering this level of care within the geographic area requested by the patient (or if unable, by the patient's family).  11/02/2013  Patient/family informed of their freedom to choose among providers that offer the needed level of care, that participate in Medicare, Medicaid or managed care program needed by the patient, have an available bed and are willing to accept the patient.  11/02/2013  Patient/family informed of [facility]' ownership interest in [facility], as well as of the fact that they are under no obligation to receive care at this facility.  PASARR submitted to EDS on  PASARR number received from EDS on   FL2 transmitted to all facilities in geographic area requested by pt/family on  11/02/2013 FL2 transmitted to all facilities within larger geographic area on   Patient informed that his/her managed care company has contracts with or will negotiate with  certain facilities, including the following:     Patient/family informed of bed offers received:  11/02/2013 Patient chooses bed at [facility] Physician recommends and patient chooses bed at    Patient to be transferred to [facility] on  11/02/2013 Patient to be transferred to facility by EMS  The following physician request were entered in Epic:   Additional Comments: Patient has existing PASRR number  Maree Krabbe, MSW, [employer] [phone]

## 2013-11-02 NOTE — Progress Notes (Signed)
FMTS Attending Note  I personally saw and evaluated the patient. The plan of care was discussed with the resident team. I agree with the assessment and plan as documented by the resident.   Patient is well this morning, tolerated pacemaker placement well, no pain over incision site, continues to have mild weakness of LUE/LLE however no change in symptoms, no further syncopal/presyncopal episodes  1. Syncope due to tachy-brady syndrome - s/p pacemaker, cleared from cardiology for discharge 2. Left sided weakness due to hypoperfusion from #1, resolved, continue current secondary prevention  Discharge to SNF today  Donnella Sham MD

## 2013-11-02 NOTE — Discharge Instructions (Signed)
° °  Supplemental Discharge Instructions for  Pacemaker/Defibrillator Patients  Activity No heavy lifting or vigorous activity with your left/right arm for 6 to 8 weeks.  Do not raise your left/right arm above your head for one week.  Gradually raise your affected arm as drawn below.           01/16                      01/17                        01/18                     01/19       NO DRIVING for 1 week; you may begin driving on 01/65/5374 unless otherwise directed. WOUND CARE   Keep the wound area clean and dry.  Do not get this area wet for one week. No showers for one week; you may shower on 11/10/2013.   The tape/steri-strips on your wound will fall off; do not pull them off.  No bandage is needed on the site.  DO  NOT apply any creams, oils, or ointments to the wound area.   If you notice any drainage or discharge from the wound, any swelling or bruising at the site, or you develop a fever > 101? F after you are discharged home, call the office at once.  Special Instructions   You are still able to use cellular telephones; use the ear opposite the side where you have your pacemaker/defibrillator.  Avoid carrying your cellular phone near your device.   When traveling through airports, show security personnel your identification card to avoid being screened in the metal detectors.  Ask the security personnel to use the hand wand.   Avoid arc welding equipment, MRI testing (magnetic resonance imaging), TENS units (transcutaneous nerve stimulators).  Call the office for questions about other devices.   Avoid electrical appliances that are in poor condition or are not properly grounded.   Microwave ovens are safe to be near or to operate.  Your medications have been changed:   STOP taking digoxin START taking metoprolol as directed START taking amiodarone  If you experience fever, redness or drainage or bleeding at the incision site, chest pain, dizziness, or shortness of breath  please return to the hospital.

## 2013-11-02 NOTE — Progress Notes (Signed)
Patient reminded numerous times to resist lifting L arm above shoulder level.  Patient stated understanding, RN will continue to remind patient.

## 2013-11-02 NOTE — Progress Notes (Signed)
Clinical Social Worker facilitated patient discharge by contacting the patient's husband, and facilty, Administrator. [name] agreeable to this plan and arranging transport via EMS . CSW will sign off, as social work intervention is no longer needed.  Maree Krabbe, MSW, Theresia Majors [phone]

## 2013-11-03 NOTE — Telephone Encounter (Signed)
Patient contacted regarding discharge from [facility] on 11/02/13.  Patient understands to follow up with provider Norma Fredrickson & wound check on 11/09/13 at 10:00am at [facility]. Patient understands discharge instructions? yes Patient understands medications and regiment? yes Patient understands to bring all medications to this visit? yes  I spoke with pt nurse at [facility]. She states pt is doing well & is having no cardiac issues at this time. She is aware of the appointment date change & times  Mylo Red RN

## 2013-11-03 NOTE — Telephone Encounter (Signed)
lmtcb Pt is at [facility] in [facility] RN

## 2013-11-04 ENCOUNTER — Encounter (HOSPITAL_COMMUNITY): Payer: Self-pay | Admitting: *Deleted

## 2013-11-09 ENCOUNTER — Encounter (INDEPENDENT_AMBULATORY_CARE_PROVIDER_SITE_OTHER): Payer: Self-pay

## 2013-11-09 ENCOUNTER — Encounter: Payer: Self-pay | Admitting: Nurse Practitioner

## 2013-11-09 ENCOUNTER — Encounter: Payer: Self-pay | Admitting: Internal Medicine

## 2013-11-09 ENCOUNTER — Ambulatory Visit (INDEPENDENT_AMBULATORY_CARE_PROVIDER_SITE_OTHER): Payer: Medicare Other | Admitting: Nurse Practitioner

## 2013-11-09 ENCOUNTER — Ambulatory Visit (INDEPENDENT_AMBULATORY_CARE_PROVIDER_SITE_OTHER): Payer: Medicare Other | Admitting: *Deleted

## 2013-11-09 VITALS — BP 80/36 | HR 50 | Ht 67.0 in | Wt 99.4 lb

## 2013-11-09 DIAGNOSIS — I495 Sick sinus syndrome: Secondary | ICD-10-CM

## 2013-11-09 LAB — MDC_IDC_ENUM_SESS_TYPE_INCLINIC
Battery Impedance: 100 Ohm
Date Time Interrogation Session: 20150121111650
Lead Channel Impedance Value: 539 Ohm
Lead Channel Pacing Threshold Amplitude: 0.5 V
Lead Channel Pacing Threshold Amplitude: 1.5 V
Lead Channel Pacing Threshold Pulse Width: 0.4 ms
Lead Channel Pacing Threshold Pulse Width: 0.64 ms
MDC IDC MSMT BATTERY REMAINING LONGEVITY: 86 mo
MDC IDC MSMT BATTERY VOLTAGE: 2.8 V
MDC IDC MSMT LEADCHNL RA IMPEDANCE VALUE: 385 Ohm
MDC IDC MSMT LEADCHNL RA SENSING INTR AMPL: 0.25 mV
MDC IDC MSMT LEADCHNL RV SENSING INTR AMPL: 8 mV
MDC IDC SET LEADCHNL RV PACING AMPLITUDE: 3.5 V
MDC IDC SET LEADCHNL RV PACING PULSEWIDTH: 0.4 ms
MDC IDC SET LEADCHNL RV SENSING SENSITIVITY: 2.8 mV
MDC IDC STAT BRADY RV PERCENT PACED: 10 %

## 2013-11-09 LAB — CBC
HCT: 29.3 % — ABNORMAL LOW (ref 36.0–46.0)
Hemoglobin: 9.6 g/dL — ABNORMAL LOW (ref 12.0–15.0)
MCHC: 32.8 g/dL (ref 30.0–36.0)
MCV: 94.7 fl (ref 78.0–100.0)
Platelets: 262 10*3/uL (ref 150.0–400.0)
RBC: 3.09 Mil/uL — ABNORMAL LOW (ref 3.87–5.11)
RDW: 17 % — ABNORMAL HIGH (ref 11.5–14.6)
WBC: 7.1 10*3/uL (ref 4.5–10.5)

## 2013-11-09 LAB — BASIC METABOLIC PANEL
BUN: 13 mg/dL (ref 6–23)
CO2: 29 mEq/L (ref 19–32)
Calcium: 8.6 mg/dL (ref 8.4–10.5)
Chloride: 102 mEq/L (ref 96–112)
Creatinine, Ser: 0.9 mg/dL (ref 0.4–1.2)
GFR: 63.08 mL/min (ref >60.00)
Glucose, Bld: 80 mg/dL (ref 70–99)
Potassium: 3.8 mEq/L (ref 3.5–5.1)
Sodium: 137 mEq/L (ref 135–145)

## 2013-11-09 MED ORDER — AMIODARONE HCL 200 MG PO TABS: 200.0000 mg | ORAL_TABLET | Freq: Every day | ORAL | Status: AC

## 2013-11-09 NOTE — Patient Instructions (Signed)
Stop digoxin  Cut the amiodarone back to 200 mg just once a day  We will check labs today  See Dr. Johney Frame in 6 weeks  Call the [facility] office at [phone] if you have any questions, problems or concerns.

## 2013-11-09 NOTE — Progress Notes (Signed)
Rodney LangtonFrances Betley Date of Birth: [birth year] Medical Record #[id number]  History of Present Illness: Ms. Lyda JesterCurtis is seen back today for a post hospital visit/TOC - seen for Dr. Johney FrameAllred. 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 She has been at a SNF for rehab and was progressing.   Most recently admitted from the SNF with presyncopal episode - noted to have over 4 second pauses on telemetry with tachy/brady - subsequent pacemaker implant. Has EF of 40 to 45% by echo. Also in atrial fib - on amiodarone and remains on Eliquis. Placed also on Toprol to be titrated as needed. BP too soft for ACE. Overall prognosis felt to be poor according to Dr. Johney FrameAllred.   Comes back today. Here with family members. In a wheelchair.    Current Outpatient Prescriptions  Medication Sig Dispense Refill  . amiodarone (PACERONE) 200 MG tablet Take 1 tablet (200 mg total) by mouth 2 (two) times daily.  60 tablet  0  . apixaban (ELIQUIS) 2.5 MG TABS tablet Take 1 tablet (2.5 mg total) by mouth 2 (two) times daily.  60 tablet  0  . Calcium-Vitamin D (CALTRATE 600 PLUS-VIT D PO) Take 1 tablet by mouth daily.      . cholecalciferol (VITAMIN D) 1000 UNITS tablet Take 1,000 Units by mouth daily.      . digoxin (LANOXIN) 0.125 MG tablet Take 0.125 mg by mouth daily.      Marland Kitchen. levothyroxine (SYNTHROID, LEVOTHROID) 75 MCG tablet Take 75 mcg by mouth daily before breakfast.      . metoprolol succinate (TOPROL-XL) 50 MG 24 hr tablet Take 1 tablet (50 mg total) by mouth daily. Take with or immediately following a meal.  30 tablet  0  . Misc Natural Products (OSTEO BI-FLEX TRIPLE STRENGTH PO) Take 1 tablet by mouth daily.      . Multiple Vitamins-Minerals (CENTRUM SILVER ULTRA WOMENS) TABS Take 1 tablet by  mouth daily.      . ondansetron (ZOFRAN-ODT) 4 MG disintegrating tablet Take 4 mg by mouth every 8 (eight) hours as needed for nausea or vomiting.      Marland Kitchen. oseltamivir (TAMIFLU) 75 MG capsule Take 75 mg by mouth daily.      . traMADol (ULTRAM) 50 MG tablet Take 50 mg by mouth 3 (three) times daily as needed (pain).      . traZODone (DESYREL) 50 MG tablet Take 50 mg by mouth at bedtime.       No current facility-administered medications for this visit.    No Known Allergies  Past Medical History  Diagnosis Date  . Stroke   . Atrial fibrillation   . Tachy-brady syndrome   . NSTEMI (non-ST elevated myocardial infarction) 09/2013    Past Surgical History  Procedure Laterality Date  . Radiology with anesthesia N/A 09/24/2013    Procedure: RADIOLOGY WITH ANESTHESIA;  Surgeon: Oneal GroutSanjeev K Deveshwar, MD;  Location: [facility] OR;  Service: Radiology;  Laterality: N/A;  . Pacemaker insertion  11/01/2013    MDT dual chamber pacemaker implanted by Dr Johney FrameAllred for tachy brady syndrome    History  Smoking status  . Never Smoker   Smokeless tobacco  . Never Used    History  Alcohol Use: Not on file  No family history on file.  Review of Systems: The review of systems is per the HPI.  All other systems were reviewed and are negative.  Physical Exam: BP 80/36  Pulse 50  Ht 5\' 7"  (1.702 m)  Wt 99 lb 6.4 oz (45.088 kg)  BMI 15.56 kg/m2 BP by me is 110/60 by me in the right arm - 90/60 with a peds cuff in the left.  Patient is very pleasant and in no acute distress. Quite thin. Looks chronically ill. Skin is warm and dry. Color is normal.  HEENT is unremarkable. Normocephalic/atraumatic. PERRL. Sclera are nonicteric. Neck is supple. No masses. No JVD. Lungs are clear. Cardiac exam shows a fairly regular rate and rhythm. Pacer in the left upper chest looks ok. Abdomen is soft. Extremities are without edema. Gait not tested. No gross neurologic deficits noted.  LABORATORY DATA: EKG today shows V  pacing - probable junctional rhythm.    Lab Results  Component Value Date   WBC 7.3 11/02/2013   HGB 8.7* 11/02/2013   HCT 26.4* 11/02/2013   PLT 298 11/02/2013   GLUCOSE 110* 11/02/2013   CHOL 109 09/25/2013   TRIG 78 09/25/2013   HDL 26* 09/25/2013   LDLCALC 67 09/25/2013   ALT 19 10/30/2013   AST 24 10/30/2013   NA 137 11/02/2013   K 4.2 11/02/2013   CL 105 11/02/2013   CREATININE 0.86 11/02/2013   BUN 12 11/02/2013   CO2 20 11/02/2013   TSH 5.195* 10/31/2013   INR 1.36 10/30/2013   HGBA1C 6.1* 09/25/2013   Echo Study Conclusions  - Left ventricle: The cavity size was normal. Wall thickness was normal. Systolic function was mildly to moderately reduced. The estimated ejection fraction was in the range of 40% to 45%. Diffuse hypokinesis. Features are consistent with a pseudonormal left ventricular filling pattern, with concomitant abnormal relaxation and increased filling pressure (grade 2 diastolic dysfunction). Doppler parameters are consistent with high ventricular filling pressure. - Mitral valve: Calcified annulus. Mildly thickened leaflets . Moderate regurgitation. - Left atrium: The atrium was mildly dilated. - Tricuspid valve: Moderate regurgitation. - Pulmonary arteries: Systolic pressure was moderately to severely increased. PA peak pressure: 40mm Hg (S). - Pericardium, extracardiac: A trivial pericardial effusion was identified.    Assessment / Plan: 1. Tachy/brady with pauses - s/p pacemaker implant - programmed VVI at 50 to prevent post termination pauses - remains on amiodarone - EKG looks to show junctional - reviewed with Dr. Johney Frame. Will stop the digoxin. Cut the amiodarone back to QD.   2. Recent NSTEMI - sounds like conservative management  3. LV systolic heart failure - looks pretty compensated. BP too soft for ACE. I have left her on her current regimen.   4. Atrial fib - on amiodarone - dose is cut back today.   5. Recent stroke - remains on Eliquis  6.  Anemia - she will need labs today  See her back in 6 weeks. Labs checked today. Medicine changes as noted.   Patient is agreeable to this plan and will call if any problems develop in the interim.   Rosalio Macadamia, RN, ANP-C [facility] [address] [place], [place]  [address] [phone]

## 2013-11-09 NOTE — Progress Notes (Signed)
Wound check appointment. Steri-strips removed. Wound without redness or edema. Incision edges approximated, wound well healed. Normal device function. Thresholds, sensing, and impedances consistent with implant measurements. Device programmed at 3.5V/auto capture programmed on for extra safety margin until 3 month visit. Histogram distribution appropriate for patient and level of activity. No high ventricular rates noted. Patient educated about wound care, arm mobility, lifting restrictions. ROV in 3 months with implanting physician.  Safety pacing noted in DDD.  Device reprogrammed to VVI per Dr. Johney Frame.   ROV with Dr. Graciela Husbands in [place].

## 2013-11-14 ENCOUNTER — Ambulatory Visit: Payer: Medicare Other

## 2013-11-20 ENCOUNTER — Encounter: Payer: Self-pay | Admitting: Internal Medicine

## 2013-11-20 ENCOUNTER — Ambulatory Visit: Payer: Self-pay | Admitting: Internal Medicine

## 2013-11-24 LAB — CBC WITH DIFFERENTIAL/PLATELET
Basophil #: 0.1 10*3/uL (ref 0.0–0.1)
Basophil %: 0.7 %
Eosinophil #: 0 10*3/uL (ref 0.0–0.7)
Eosinophil %: 0 %
HCT: 32.7 % — ABNORMAL LOW (ref 35.0–47.0)
HGB: 10.8 g/dL — AB (ref 12.0–16.0)
LYMPHS ABS: 2.2 10*3/uL (ref 1.0–3.6)
Lymphocyte %: 28.8 %
MCH: 31.6 pg (ref 26.0–34.0)
MCHC: 33.1 g/dL (ref 32.0–36.0)
MCV: 96 fL (ref 80–100)
MONO ABS: 0.5 x10 3/mm (ref 0.2–0.9)
Monocyte %: 7.1 %
Neutrophil #: 4.8 10*3/uL (ref 1.4–6.5)
Neutrophil %: 63.4 %
PLATELETS: 326 10*3/uL (ref 150–440)
RBC: 3.42 10*6/uL — ABNORMAL LOW (ref 3.80–5.20)
RDW: 15.8 % — AB (ref 11.5–14.5)
WBC: 7.5 10*3/uL (ref 3.6–11.0)

## 2013-11-29 ENCOUNTER — Ambulatory Visit: Payer: Self-pay | Admitting: Urology

## 2013-12-01 ENCOUNTER — Ambulatory Visit: Payer: Self-pay | Admitting: Internal Medicine

## 2013-12-16 ENCOUNTER — Ambulatory Visit: Payer: Self-pay | Admitting: Nurse Practitioner

## 2013-12-16 ENCOUNTER — Telehealth: Payer: Self-pay | Admitting: Nurse Practitioner

## 2013-12-16 NOTE — Telephone Encounter (Signed)
Dori pt's nurse w/[facility] called to r/s.Marland Kitchen  Dori needs Dr. Marlis Edelson nurse to call her [phone] to r/s apt soon due to this is a f/u from the hospital w/stroke

## 2013-12-18 ENCOUNTER — Ambulatory Visit: Payer: Self-pay | Admitting: Nurse Practitioner

## 2013-12-20 NOTE — Telephone Encounter (Signed)
Called Dori(nurse) for a cancellation appt for today, but she said that husband is the one that would bring patient and he is sick, would like a cancellation appointment for next week

## 2013-12-21 ENCOUNTER — Ambulatory Visit: Payer: Medicare Other | Admitting: Nurse Practitioner

## 2013-12-21 ENCOUNTER — Ambulatory Visit: Payer: Self-pay | Admitting: Nurse Practitioner

## 2013-12-21 NOTE — Telephone Encounter (Signed)
At this time Dr. Pearlean Brownie nor Larita Fife lam has any openings on the week of 01-02-2014, if a patient cancel we will call Taryiah Olexa to see if she can come. We tried to get the patient in last week but she refused to take that appointment. Will keep her on the waiting list.

## 2013-12-30 ENCOUNTER — Other Ambulatory Visit (HOSPITAL_COMMUNITY): Payer: Self-pay | Admitting: Interventional Radiology

## 2013-12-30 ENCOUNTER — Telehealth (HOSPITAL_COMMUNITY): Payer: Self-pay | Admitting: Interventional Radiology

## 2013-12-30 DIAGNOSIS — I639 Cerebral infarction, unspecified: Secondary | ICD-10-CM

## 2013-12-30 NOTE — Telephone Encounter (Signed)
Left VM for pt or family to call to schedule 8 wk f/u (stroke) MRI JM

## 2014-01-10 ENCOUNTER — Encounter: Payer: Self-pay | Admitting: Nurse Practitioner

## 2014-01-10 ENCOUNTER — Ambulatory Visit (INDEPENDENT_AMBULATORY_CARE_PROVIDER_SITE_OTHER): Payer: Medicare Other | Admitting: Nurse Practitioner

## 2014-01-10 VITALS — BP 110/70 | HR 50 | Ht 67.0 in | Wt 104.4 lb

## 2014-01-10 DIAGNOSIS — I6350 Cerebral infarction due to unspecified occlusion or stenosis of unspecified cerebral artery: Secondary | ICD-10-CM

## 2014-01-10 DIAGNOSIS — I4891 Unspecified atrial fibrillation: Secondary | ICD-10-CM

## 2014-01-10 LAB — BASIC METABOLIC PANEL
BUN: 23 mg/dL (ref 6–23)
CO2: 26 mEq/L (ref 19–32)
Calcium: 9 mg/dL (ref 8.4–10.5)
Chloride: 101 mEq/L (ref 96–112)
Creatinine, Ser: 1.2 mg/dL (ref 0.4–1.2)
GFR: 44.96 mL/min — ABNORMAL LOW (ref >60.00)
Glucose, Bld: 75 mg/dL (ref 70–99)
Potassium: 4.1 mEq/L (ref 3.5–5.1)
Sodium: 134 mEq/L — ABNORMAL LOW (ref 135–145)

## 2014-01-10 LAB — CBC
HCT: 34 % — ABNORMAL LOW (ref 36.0–46.0)
Hemoglobin: 11.2 g/dL — ABNORMAL LOW (ref 12.0–15.0)
MCHC: 33.1 g/dL (ref 30.0–36.0)
MCV: 91.7 fl (ref 78.0–100.0)
Platelets: 384 10*3/uL (ref 150.0–400.0)
RBC: 3.71 Mil/uL — ABNORMAL LOW (ref 3.87–5.11)
RDW: 15.3 % — ABNORMAL HIGH (ref 11.5–14.6)
WBC: 9.5 10*3/uL (ref 4.5–10.5)

## 2014-01-10 LAB — TSH: TSH: 6.05 u[IU]/mL — ABNORMAL HIGH (ref 0.35–5.50)

## 2014-01-10 NOTE — Progress Notes (Signed)
Dorothy [name] Date of Birth: Aug 16, 1931 Medical Record #[id number]  History of Present Illness: Dorothy [name] is seen back today for a 6 week check - seen for Dr. Johney Frame. 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 She was discharged to SNF for rehab and was progressing. Was readmitted from SNF with presyncopal episode - noted to have over 4 second pauses on telemetry with tachy/brady - subsequent pacemaker implant. Has EF of 40 to 45% by echo. Also in atrial fib - on amiodarone and remains on Eliquis. Placed also on Toprol to be titrated as needed. BP too soft for ACE. Overall prognosis felt to be poor according to Dr. Johney Frame.   Seen last month - PPM is at VVI 50. EKG looked to be junctional - hard to say. Discussed with Dr. Johney Frame and we stopped the digoxin and cut the amiodarone back to just QD. BP was still too soft and we did not start ACE.   Comes back today. Here with her family members. She is in a wheelchair. Doing ok. Still at [facility]. Able to do the rehab. Now with her days and nights mixed up. No chest pain. Not short of breath. On Megace now and has picked up a few pounds. Not dizzy or lightheaded. Seems to be tolerating her medicines. No falls. Not able to walk without assistance. Long term plan unsure.    Current Outpatient Prescriptions  Medication Sig Dispense Refill  . amiodarone (PACERONE) 200 MG tablet Take 1 tablet (200 mg total) by mouth daily.  60 tablet  0  . apixaban (ELIQUIS) 2.5 MG TABS tablet Take 1 tablet (2.5 mg total) by mouth 2 (two) times daily.  60 tablet  0  . Calcium-Vitamin D (CALTRATE 600 PLUS-VIT D PO) Take 1 tablet by mouth daily.      . cholecalciferol (VITAMIN D) 1000 UNITS tablet Take 1,000 Units by mouth daily.        . divalproex (DEPAKOTE SPRINKLE) 125 MG capsule Take 125 mg by mouth 2 (two) times daily.      Marland Kitchen levothyroxine (SYNTHROID, LEVOTHROID) 75 MCG tablet Take 75 mcg by mouth daily before breakfast.      . megestrol (MEGACE ES) 625 MG/5ML suspension Take by mouth daily. 5 ml      . Melatonin-Pyridoxine (MELATIN PO) Take 3 mg by mouth daily.      . metoprolol succinate (TOPROL-XL) 50 MG 24 hr tablet Take 25 mg by mouth daily. Take with or immediately following a meal.      . Misc Natural Products (OSTEO BI-FLEX TRIPLE STRENGTH PO) Take 1 tablet by mouth daily.      . Multiple Vitamins-Minerals (CENTRUM SILVER ULTRA WOMENS) TABS Take 1 tablet by mouth daily.      . QUEtiapine (SEROQUEL) 25 MG tablet Take 25 mg by mouth 2 (two) times daily.      . sennosides-docusate sodium (SENOKOT-S) 8.6-50 MG tablet Take 1 tablet by mouth 2 (two) times daily.      . traMADol (ULTRAM) 50 MG tablet Take 50 mg by mouth 2 (two) times daily.        No current facility-administered medications for this visit.    No Known Allergies  Past Medical History  Diagnosis Date  .  Stroke   . Atrial fibrillation   . Tachy-brady syndrome   . NSTEMI (non-ST elevated myocardial infarction) 09/2013    Past Surgical History  Procedure Laterality Date  . Radiology with anesthesia N/A 09/24/2013    Procedure: RADIOLOGY WITH ANESTHESIA;  Surgeon: Oneal GroutSanjeev K Deveshwar, MD;  Location: [facility] OR;  Service: Radiology;  Laterality: N/A;  . Pacemaker insertion  11/01/2013    MDT dual chamber pacemaker implanted by Dr Johney FrameAllred for tachy brady syndrome    History  Smoking status  . Never Smoker   Smokeless tobacco  . Never Used    History  Alcohol Use: Not on file    History reviewed. No pertinent family history.  Review of Systems: The review of systems is per the HPI.  All other systems were reviewed and are negative.  Physical Exam: BP 110/70  Pulse 50  Ht 5\' 7"  (1.702 m)  Wt 104 lb 6.4 oz (47.356 kg)  BMI 16.35  kg/m2 Patient is an elderly female who looks chronically ill but in no acute distress. She falls asleep during the exam. She is quite thin. Skin is warm and dry. Color is normal.  HEENT is unremarkable. Normocephalic/atraumatic. PERRL. Sclera are nonicteric. Neck is supple. No masses. No JVD. Lungs are clear. Cardiac exam shows a fairly regular rate and rhythm. Abdomen is soft. Extremities are without edema. Gait and ROM are intact. No gross neurologic deficits noted.  Wt Readings from Last 3 Encounters:  01/10/14 104 lb 6.4 oz (47.356 kg)  11/09/13 99 lb 6.4 oz (45.088 kg)  10/31/13 97 lb 14.2 oz (44.4 kg)     LABORATORY DATA: PENDING  Lab Results  Component Value Date   WBC 7.1 11/09/2013   HGB 9.6* 11/09/2013   HCT 29.3* 11/09/2013   PLT 262.0 11/09/2013   GLUCOSE 80 11/09/2013   CHOL 109 09/25/2013   TRIG 78 09/25/2013   HDL 26* 09/25/2013   LDLCALC 67 09/25/2013   ALT 19 10/30/2013   AST 24 10/30/2013   NA 137 11/09/2013   K 3.8 11/09/2013   CL 102 11/09/2013   CREATININE 0.9 11/09/2013   BUN 13 11/09/2013   CO2 29 11/09/2013   TSH 5.195* 10/31/2013   INR 1.36 10/30/2013   HGBA1C 6.1* 09/25/2013   Echo Study Conclusions  - Left ventricle: The cavity size was normal. Wall thickness was normal. Systolic function was mildly to moderately reduced. The estimated ejection fraction was in the range of 40% to 45%. Diffuse hypokinesis. Features are consistent with a pseudonormal left ventricular filling pattern, with concomitant abnormal relaxation and increased filling pressure (grade 2 diastolic dysfunction). Doppler parameters are consistent with high ventricular filling pressure. - Mitral valve: Calcified annulus. Mildly thickened leaflets . Moderate regurgitation. - Left atrium: The atrium was mildly dilated. - Tricuspid valve: Moderate regurgitation. - Pulmonary arteries: Systolic pressure was moderately to severely increased. PA peak pressure: 59mm Hg (S). - Pericardium,  extracardiac: A trivial pericardial effusion was identified.  Assessment / Plan:  1. Tachy/brady with pauses - s/p pacemaker implant - programmed VVI at 50 to prevent post termination pauses - remains on amiodarone - no longer on digoxin - EKG today shows V pacing at 50. May have some sinus beats. I have left her on her current regimen.   2. Recent NSTEMI - sounds like conservative management was in order. No active symptoms reported.   3. LV systolic heart failure - looks pretty compensated. BP still too soft for ACE. I have left her  on her current regimen.   4. Atrial fib - on amiodarone - on maintenance dose   5. Recent stroke - remains on Eliquis - no falls noted - was a little anemic on last set of labs - will recheck today  6. Hypothyroidism - recheck TSH today as well.   Overall prognosis remains tenuous but she seems to be holding her own. I have left her on her current regimen.   Patient is agreeable to this plan and will call if any problems develop in the interim.   Rosalio Macadamia, RN, ANP-C  [facility]  [address]  [place], [place] [address]  [phone]

## 2014-01-10 NOTE — Patient Instructions (Addendum)
Continue with your current medicines  We will recheck some lab today  See Dr. Johney Frame in 3 months  Call the [facility] office at [phone] if you have any questions, problems or concerns.

## 2014-01-16 ENCOUNTER — Telehealth (HOSPITAL_COMMUNITY): Payer: Self-pay | Admitting: Interventional Radiology

## 2014-01-16 NOTE — Telephone Encounter (Signed)
Called pt's home, left VM. Also called [facility] [phone]), they state that she is no longer a resident there but has been moved to [facility]. JM

## 2014-01-18 ENCOUNTER — Ambulatory Visit: Payer: Self-pay | Admitting: Nurse Practitioner

## 2014-03-03 ENCOUNTER — Ambulatory Visit: Payer: Self-pay | Admitting: Nurse Practitioner

## 2014-03-08 ENCOUNTER — Telehealth: Payer: Self-pay | Admitting: Neurology

## 2014-03-08 NOTE — Telephone Encounter (Signed)
Dorothy [name] with [facility] calling to inform Dr Pearlean Brownie, the patient wasn't able to have MRI/MRA at [facility] due to pace maker.  Please call and advise and ask for [facility] one nurse.  Thanks

## 2014-03-08 NOTE — Telephone Encounter (Signed)
Dorothy [name] from [facility] called wanting to make Dr. Pearlean Brownie aware that the pt did not have the MRI/MRA because of pt's pace maker. Pt has an appt on 03/14/14 with Dr. Pearlean Brownie. FYI

## 2014-03-13 ENCOUNTER — Emergency Department: Payer: Self-pay | Admitting: Emergency Medicine

## 2014-03-14 ENCOUNTER — Encounter: Payer: Self-pay | Admitting: Neurology

## 2014-03-14 ENCOUNTER — Ambulatory Visit (INDEPENDENT_AMBULATORY_CARE_PROVIDER_SITE_OTHER): Payer: Medicare Other | Admitting: Neurology

## 2014-03-14 ENCOUNTER — Encounter (INDEPENDENT_AMBULATORY_CARE_PROVIDER_SITE_OTHER): Payer: Self-pay

## 2014-03-14 VITALS — BP 131/67 | HR 63 | Ht 66.0 in | Wt 104.0 lb

## 2014-03-14 DIAGNOSIS — G8110 Spastic hemiplegia affecting unspecified side: Secondary | ICD-10-CM

## 2014-03-14 DIAGNOSIS — I6350 Cerebral infarction due to unspecified occlusion or stenosis of unspecified cerebral artery: Secondary | ICD-10-CM

## 2014-03-14 NOTE — Patient Instructions (Addendum)
I had a long discussion with the patient, husband and asked daughter-in-law and answered questions about her recent stroke. Recommend continue epixaban for secondary stroke prevention given atrial fibrillation. Strict control of hypertension with blood pressure goal below 130/90. Recommend continue physical and occupational therapy. Patient will need to stay in a supervised environment for long long-term. Return for followup in 3 months with  Heide Guile, NP. Or  call earlier if necessary.

## 2014-03-14 NOTE — Progress Notes (Signed)
[facility] [address] [place]. [place] [address] [phone]       OFFICE FOLLOW-UP NOTE  [name] Date of Birth:  14-Mar-1931 Medical Record Number:  [id number]   HPI: 28 year Caucasian lady with first office followup visit following hospital admission for stroke on 09/24/13. She is accompanied by her husband and asked daughter-in-law who provide most of the history. She had weakness. Of unresponsiveness, falling to the floor and EMS noted her to have left-sided weakness and dysarthria. NIH stroke scale on admission was 19. CT scan showed a questionable ill-defined hypodensity in the right middle cerebral artery territory with a hyperdensity in the right carotid terminus/M1 segment of the middle cerebral artery raising possibility of thrombus. She was given IV t-PA uneventfully but did not show neurological improvement hence emergent catheter angiogram was done which showed occlusion of the right middle cerebral artery in its proximal segment. She was given 10.8 mg of intra-arterial TPA and underwent mechanical embolectomy with solitaire retrieval device and TICI 3 revascularization was obtained. She was admitted to the neurologic intensive care unit where  blood pressure was kept tightly controlled. Initial chest x-ray revealed pulmonary edema which was felt to be related to new-onset April fibrillation with rapid ventricular rate and fluid overload. She has resting echo showed a decreased ejection fraction of 35%. She was seen by pulmonary critical care, cardiology and interventional neuroradiology services. She made gradual improvement and was extubated. She was subsequently started on  Apixaban for secondary stroke prevention given her high risk for recurrent strokes. She had dysarthric speech and left hemiparesis the time of discharge to rehabilitation. She is currently in skilled nursing facility and has not gotten substantially to maintain her gait and walking  ability. Physical and occupation therapy apparently had signed off. She was still a one-person assist him in with her walker with her therapist but presently she is not walking a lot. She spends most of the time in the chair and needs one-person assist to go to the restroom. She is able to feed herself and assist in transfers and changing her clothes. She recently got approval from [facility] She had a fall 3 days ago in the hall at the nursing facility and sustained some stitches on her back. She did have some mild memory difficulties prior to stroke which seemed to be more pronounced now she'll have resting tremors related to chronic anxiety  ROS:   14 system review of systems is positive for constipation, weight change, walking difficulty, anxiety, memory loss, weakness and no other systems negative  PMH:  Past Medical History  Diagnosis Date  . Stroke   . Atrial fibrillation   . Tachy-brady syndrome   . NSTEMI (non-ST elevated myocardial infarction) 09/2013    Social History:  History   Social History  . Marital Status: Married    Spouse Name: N/A    Number of Children: 0  . Years of Education: 12th   Occupational History  . retired    Social History Main Topics  . Smoking status: Never Smoker   . Smokeless tobacco: Never Used  . Alcohol Use: Not on file  . Drug Use: Not on file  . Sexual Activity: No   Other Topics Concern  . Not on file   Social History Narrative  . No narrative on file    Medications:   Current Outpatient Prescriptions on File Prior to Visit  Medication Sig Dispense Refill  . amiodarone (PACERONE) 200 MG tablet Take  1 tablet (200 mg total) by mouth daily.  60 tablet  0  . apixaban (ELIQUIS) 2.5 MG TABS tablet Take 1 tablet (2.5 mg total) by mouth 2 (two) times daily.  60 tablet  0  . Calcium-Vitamin D (CALTRATE 600 PLUS-VIT D PO) Take 1 tablet by mouth daily.      . cholecalciferol (VITAMIN D) 1000 UNITS tablet Take 1,000 Units by mouth daily.        . divalproex (DEPAKOTE SPRINKLE) 125 MG capsule Take 125 mg by mouth 2 (two) times daily.      Marland Kitchen levothyroxine (SYNTHROID, LEVOTHROID) 75 MCG tablet Take 75 mcg by mouth daily before breakfast.      . megestrol (MEGACE ES) 625 MG/5ML suspension Take by mouth daily. 5 ml      . Melatonin-Pyridoxine (MELATIN PO) Take 3 mg by mouth daily.      . metoprolol succinate (TOPROL-XL) 50 MG 24 hr tablet Take 25 mg by mouth daily. Take with or immediately following a meal.      . Misc Natural Products (OSTEO BI-FLEX TRIPLE STRENGTH PO) Take 1 tablet by mouth daily.      . QUEtiapine (SEROQUEL) 25 MG tablet Take 25 mg by mouth 2 (two) times daily.      . sennosides-docusate sodium (SENOKOT-S) 8.6-50 MG tablet Take 1 tablet by mouth 2 (two) times daily.      . traMADol (ULTRAM) 50 MG tablet Take 50 mg by mouth 2 (two) times daily.        No current facility-administered medications on file prior to visit.    Allergies:  No Known Allergies  Physical Exam General: Frail elderly Caucasian lady seated, in no evident distress Head: head normocephalic and atraumatic. Orohparynx benign Neck: supple with no carotid or supraclavicular bruits Cardiovascular: regular rate and rhythm, no murmurs Musculoskeletal: no deformity Skin:  no rash/petichiae Vascular:  Normal pulses all extremities Filed Vitals:   03/14/14 1525  BP: 131/67  Pulse: 63   Neurologic Exam Mental Status: Awake and fully alert. Oriented to place and time. Recent and remote memory diminished. Attention span, concentration and fund of knowledge diminished. Mood and affect appropriate.  Cranial Nerves: Fundoscopic exam reveals sharp disc margins. Pupils equal, briskly reactive to light. Extraocular movements full without nystagmus. Visual fields show diminished blink to threat on left to confrontation. Hearing intact. Facial sensation intact. Mild left lower face weakness but tongue, palate moves normally and symmetrically.  Motor: Diminished  bulk throughout with normal strength tone, sensation on the right side. Spastic left hemiparesis with grade 3/5 strength with weakness of the left grip, intrinsic hand muscles, left hip flexors and ankle dorsiflexors. Mild left lower extremity drift. Sensory.: Diminished sensation left hemibody to touch and pinprick and vibratory sensation.  Coordination: Rapid alternating movements normal on the right side and impaired on the left proportionate to the degree of weakness.  Gait and Station: Unable to arise from the chair even with difficulty. Gait not tested  Reflexes: 2+ and asymmetric and brisker on the. Toes downgoing.   NIHSS  6 Modified Rankin  4   ASSESSMENT: 53 year Caucasian lady with embolic right middle cerebral artery infarct in December 2014 secondary to atrial fibrillation treated with intravenous plus intra-arterial TPA and mechanical embolectomy with solitaire retrieval device with residual spastic left hemiparesis.    PLAN: I had a long discussion with the patient, husband and asked daughter-in-law and answered questions about her recent stroke. Recommend continue Apixaban for secondary stroke prevention given  atrial fibrillation. Strict control of hypertension with blood pressure goal below 130/90. Recommend continue physical and occupational therapy. Patient will need to stay in a supervised environment for long long-term. Return for followup in 3 months with  Heide GuileLynn Lam, NP. Or  call earlier if necessary.     Note: This document was prepared with digital dictation and possible smart phrase technology. Any transcriptional errors that result from this process are unintentional

## 2014-03-15 ENCOUNTER — Ambulatory Visit (INDEPENDENT_AMBULATORY_CARE_PROVIDER_SITE_OTHER)
Admission: RE | Admit: 2014-03-15 | Discharge: 2014-03-15 | Disposition: A | Discharge status: Home / Self Care | Payer: Medicare Other | Source: Ambulatory Visit | Attending: Internal Medicine | Admitting: Internal Medicine

## 2014-03-15 ENCOUNTER — Ambulatory Visit (INDEPENDENT_AMBULATORY_CARE_PROVIDER_SITE_OTHER): Payer: Medicare Other | Admitting: Internal Medicine

## 2014-03-15 ENCOUNTER — Encounter: Payer: Self-pay | Admitting: Internal Medicine

## 2014-03-15 VITALS — BP 94/58 | HR 52 | Ht 66.0 in | Wt 106.0 lb

## 2014-03-15 DIAGNOSIS — I495 Sick sinus syndrome: Secondary | ICD-10-CM

## 2014-03-15 DIAGNOSIS — T82110A Breakdown (mechanical) of cardiac electrode, initial encounter: Secondary | ICD-10-CM

## 2014-03-15 DIAGNOSIS — T82190A Other mechanical complication of cardiac electrode, initial encounter: Secondary | ICD-10-CM

## 2014-03-15 DIAGNOSIS — I4891 Unspecified atrial fibrillation: Secondary | ICD-10-CM

## 2014-03-15 DIAGNOSIS — I6350 Cerebral infarction due to unspecified occlusion or stenosis of unspecified cerebral artery: Secondary | ICD-10-CM

## 2014-03-15 LAB — MDC_IDC_ENUM_SESS_TYPE_INCLINIC
Battery Impedance: 100 Ohm
Battery Remaining Longevity: 165 mo
Battery Voltage: 2.8 V
Brady Statistic RV Percent Paced: 14 %
Date Time Interrogation Session: 20150527164148
Lead Channel Impedance Value: 67 Ohm
Lead Channel Pacing Threshold Amplitude: 0.5 V
Lead Channel Pacing Threshold Pulse Width: 0.4 ms
Lead Channel Setting Pacing Pulse Width: 0.4 ms
Lead Channel Setting Sensing Sensitivity: 5.6 mV
MDC IDC MSMT LEADCHNL RV IMPEDANCE VALUE: 674 Ohm
MDC IDC MSMT LEADCHNL RV SENSING INTR AMPL: 11.2 mV
MDC IDC SET LEADCHNL RV PACING AMPLITUDE: 2.5 V

## 2014-03-15 NOTE — Patient Instructions (Addendum)
Remote monitoring is used to monitor your pacemaker from home. This monitoring reduces the number of office visits required to check your device to one time per year. It allows [place] to keep an eye on the functioning of your device to ensure it is working properly. You are scheduled for a device check from home on 06-20-2014. You may send your transmission at any time that day. If you have a wireless device, the transmission will be sent automatically. After your physician reviews your transmission, you will receive a postcard with your next transmission date.  Your physician recommends that you schedule a follow-up appointment in: 3 months with Phylliss Blakes and 12 months with Dr.Allred  PLEASE CALL IF THE NURSING FACILITY IS UNABLE TO SEND IN THE REMOTE TRANSMISSIONS.  A chest x-ray takes a picture of the organs and structures inside the chest, including the heart, lungs, and blood vessels. This test can show several things, including, whether the heart is enlarges; whether fluid is building up in the lungs; and whether pacemaker / defibrillator leads are still in place.

## 2014-03-15 NOTE — Progress Notes (Signed)
PCP: Lyndon CodeKHAN, FOZIA M, MD  [name] is a 80 y.o. female who presents today for routine electrophysiology followup.  Since having her pacemaker implanted, she continues to make slow progress. She has stable dementia. Today, she denies symptoms of palpitations, chest pain, shortness of breath,  lower extremity edema, dizziness, presyncope, or syncope.  She is unsteady at times and did recently fall.   The patient is otherwise without complaint today.   Past Medical History  Diagnosis Date  . Stroke   . Atrial fibrillation   . Tachy-brady syndrome   . NSTEMI (non-ST elevated myocardial infarction) 09/2013   Past Surgical History  Procedure Laterality Date  . Radiology with anesthesia N/A 09/24/2013    Procedure: RADIOLOGY WITH ANESTHESIA;  Surgeon: Oneal GroutSanjeev K Deveshwar, MD;  Location: [facility] OR;  Service: Radiology;  Laterality: N/A;  . Pacemaker insertion  11/01/2013    MDT dual chamber pacemaker implanted by Dr Johney FrameAllred for tachy brady syndrome    Current Outpatient Prescriptions  Medication Sig Dispense Refill  . acetaminophen (TYLENOL) 325 MG tablet Take 650 mg by mouth every 6 (six) hours as needed.      Marland Kitchen. amiodarone (PACERONE) 200 MG tablet Take 1 tablet (200 mg total) by mouth daily.  60 tablet  0  . apixaban (ELIQUIS) 2.5 MG TABS tablet Take 1 tablet (2.5 mg total) by mouth 2 (two) times daily.  60 tablet  0  . Calcium-Vitamin D (CALTRATE 600 PLUS-VIT D PO) Take 1 tablet by mouth daily.      . cholecalciferol (VITAMIN D) 1000 UNITS tablet Take 1,000 Units by mouth daily.      . divalproex (DEPAKOTE SPRINKLE) 125 MG capsule Take 250 mg by mouth at bedtime.       Marland Kitchen. levothyroxine (SYNTHROID, LEVOTHROID) 75 MCG tablet Take 75 mcg by mouth daily before breakfast.      . megestrol (MEGACE ES) 625 MG/5ML suspension Take by mouth daily. 5 ml      . Melatonin-Pyridoxine (MELATIN PO) Take 3 mg by mouth daily.      . metoprolol succinate (TOPROL-XL) 50 MG 24 hr tablet Take 25 mg by mouth daily.  Take with or immediately following a meal.      . Misc Natural Products (OSTEO BI-FLEX TRIPLE STRENGTH PO) Take 1 tablet by mouth daily.      . Multiple Vitamins-Minerals (CEROVITE PO) Take 1 capsule by mouth daily.      . QUEtiapine (SEROQUEL) 25 MG tablet Take 25 mg by mouth every evening.       . sennosides-docusate sodium (SENOKOT-S) 8.6-50 MG tablet Take 1 tablet by mouth 2 (two) times daily.      . traMADol (ULTRAM) 50 MG tablet Take 50 mg by mouth 2 (two) times daily.        No current facility-administered medications for this visit.    Physical Exam: Filed Vitals:   03/15/14 1452  BP: 94/58  Pulse: 52  Height: 5\' 6"  (1.676 m)  Weight: 106 lb (48.081 kg)    GEN- The patient is elderly and frail appearing, alert  Head- normocephalic, atraumatic Eyes-  Sclera clear, conjunctiva pink Ears- hearing intact Oropharynx- clear Lungs- Clear to ausculation bilaterally, normal work of breathing Chest- pacemaker pocket is well healed Heart- Regular rate and rhythm (paced) GI- soft, NT, ND, + BS Extremities- no clubbing, cyanosis, or edema  Pacemaker interrogation- reviewed in detail today,  See PACEART report  Assessment and Plan:  1. Bradycardia/ tachycardia I suspect that her atrial  lead has dislodged I will obtain a CXR She is programmed VVIR today I had a long discussion with her family.  Our plan is to avoid lead revision if at all possible.  I think that this is the correct approach.  See Arita Miss Art report  2. afib Given prior stroke and very elevated chads2vasc score, I would like to keep her on eliquis if possible

## 2014-03-18 ENCOUNTER — Ambulatory Visit: Payer: Self-pay | Admitting: Neurology

## 2014-03-18 ENCOUNTER — Inpatient Hospital Stay: Payer: Self-pay | Admitting: Internal Medicine

## 2014-03-18 DIAGNOSIS — Z01810 Encounter for preprocedural cardiovascular examination: Secondary | ICD-10-CM

## 2014-03-18 DIAGNOSIS — I10 Essential (primary) hypertension: Secondary | ICD-10-CM

## 2014-03-18 DIAGNOSIS — I4891 Unspecified atrial fibrillation: Secondary | ICD-10-CM

## 2014-03-18 LAB — URINALYSIS, COMPLETE
BILIRUBIN, UR: NEGATIVE
GLUCOSE, UR: NEGATIVE mg/dL (ref 0–75)
Ketone: NEGATIVE
Nitrite: NEGATIVE
PH: 6 (ref 4.5–8.0)
Specific Gravity: 1.015 (ref 1.003–1.030)
Squamous Epithelial: NONE SEEN
WBC UR: 26 /HPF (ref 0–5)

## 2014-03-18 LAB — CK-MB
CK-MB: 1.7 ng/mL (ref 0.5–3.6)
CK-MB: 1.5 ng/mL (ref 0.5–3.6)
CK-MB: 1.4 ng/mL (ref 0.5–3.6)

## 2014-03-18 LAB — CBC
HCT: 32.8 % — ABNORMAL LOW (ref 35.0–47.0)
HGB: 10.8 g/dL — ABNORMAL LOW (ref 12.0–16.0)
MCH: 30.8 pg (ref 26.0–34.0)
MCHC: 33 g/dL (ref 32.0–36.0)
MCV: 93 fL (ref 80–100)
Platelet: 282 10*3/uL (ref 150–440)
RBC: 3.52 10*6/uL — AB (ref 3.80–5.20)
RDW: 17.8 % — ABNORMAL HIGH (ref 11.5–14.5)
WBC: 15.3 10*3/uL — ABNORMAL HIGH (ref 3.6–11.0)

## 2014-03-18 LAB — COMPREHENSIVE METABOLIC PANEL
ANION GAP: 11 (ref 7–16)
Albumin: 3.1 g/dL — ABNORMAL LOW (ref 3.4–5.0)
Alkaline Phosphatase: 60 U/L
BUN: 30 mg/dL — ABNORMAL HIGH (ref 7–18)
Bilirubin,Total: 0.4 mg/dL (ref 0.2–1.0)
CALCIUM: 9.2 mg/dL (ref 8.5–10.1)
CO2: 25 mmol/L (ref 21–32)
Chloride: 103 mmol/L (ref 98–107)
Creatinine: 1.41 mg/dL — ABNORMAL HIGH (ref 0.60–1.30)
EGFR (Non-African Amer.): 35 — ABNORMAL LOW
GFR CALC AF AMER: 40 — AB
Glucose: 108 mg/dL — ABNORMAL HIGH (ref 65–99)
OSMOLALITY: 284 (ref 275–301)
Potassium: 4.5 mmol/L (ref 3.5–5.1)
SGOT(AST): 26 U/L (ref 15–37)
SGPT (ALT): 30 U/L (ref 12–78)
Sodium: 139 mmol/L (ref 136–145)
Total Protein: 7.1 g/dL (ref 6.4–8.2)

## 2014-03-18 LAB — TROPONIN I
Troponin-I: <0.02 ng/mL
Troponin-I: 0.03 ng/mL

## 2014-03-18 LAB — PROTIME-INR
INR: 1.1
Prothrombin Time: 14 secs (ref 11.5–14.7)

## 2014-03-18 LAB — VALPROIC ACID LEVEL: Valproic Acid: 17 ug/mL — ABNORMAL LOW

## 2014-03-19 LAB — CBC WITH DIFFERENTIAL/PLATELET
Basophil #: 0.1 10*3/uL (ref 0.0–0.1)
Basophil %: 0.6 %
Eosinophil #: 0.1 10*3/uL (ref 0.0–0.7)
Eosinophil %: 0.7 %
HCT: 23.1 % — ABNORMAL LOW (ref 35.0–47.0)
HGB: 7.7 g/dL — ABNORMAL LOW (ref 12.0–16.0)
Lymphocyte #: 2 10*3/uL (ref 1.0–3.6)
Lymphocyte %: 15.5 %
MCH: 31.1 pg (ref 26.0–34.0)
MCHC: 33.3 g/dL (ref 32.0–36.0)
MCV: 93 fL (ref 80–100)
MONOS PCT: 11.9 %
Monocyte #: 1.5 x10 3/mm — ABNORMAL HIGH (ref 0.2–0.9)
NEUTROS PCT: 71.3 %
Neutrophil #: 9 10*3/uL — ABNORMAL HIGH (ref 1.4–6.5)
Platelet: 198 10*3/uL (ref 150–440)
RBC: 2.47 10*6/uL — AB (ref 3.80–5.20)
RDW: 17.6 % — ABNORMAL HIGH (ref 11.5–14.5)
WBC: 12.7 10*3/uL — AB (ref 3.6–11.0)

## 2014-03-19 LAB — BASIC METABOLIC PANEL
ANION GAP: 8 (ref 7–16)
BUN: 21 mg/dL — ABNORMAL HIGH (ref 7–18)
Calcium, Total: 8.1 mg/dL — ABNORMAL LOW (ref 8.5–10.1)
Chloride: 108 mmol/L — ABNORMAL HIGH (ref 98–107)
Co2: 22 mmol/L (ref 21–32)
Creatinine: 1.28 mg/dL (ref 0.60–1.30)
GFR CALC AF AMER: 45 — AB
GFR CALC NON AF AMER: 39 — AB
Glucose: 118 mg/dL — ABNORMAL HIGH (ref 65–99)
OSMOLALITY: 280 (ref 275–301)
POTASSIUM: 4 mmol/L (ref 3.5–5.1)
Sodium: 138 mmol/L (ref 136–145)

## 2014-03-19 LAB — TSH: Thyroid Stimulating Horm: 8.85 u[IU]/mL — ABNORMAL HIGH

## 2014-03-19 LAB — HEMOGLOBIN: HGB: 10.9 g/dL — ABNORMAL LOW (ref 12.0–16.0)

## 2014-03-20 ENCOUNTER — Ambulatory Visit
Admit: 2014-03-20 | Disposition: A | Discharge status: Home / Self Care | Payer: Self-pay | Attending: Nurse Practitioner | Admitting: Nurse Practitioner

## 2014-03-20 LAB — CBC WITH DIFFERENTIAL/PLATELET
Basophil #: 0.1 10*3/uL (ref 0.0–0.1)
Basophil %: 0.7 %
Eosinophil #: 0.1 10*3/uL (ref 0.0–0.7)
Eosinophil %: 0.4 %
HCT: 31.7 % — AB (ref 35.0–47.0)
HGB: 10.6 g/dL — ABNORMAL LOW (ref 12.0–16.0)
Lymphocyte #: 2 10*3/uL (ref 1.0–3.6)
Lymphocyte %: 15.4 %
MCH: 30.4 pg (ref 26.0–34.0)
MCHC: 33.5 g/dL (ref 32.0–36.0)
MCV: 91 fL (ref 80–100)
MONO ABS: 1.7 x10 3/mm — AB (ref 0.2–0.9)
Monocyte %: 12.7 %
Neutrophil #: 9.3 10*3/uL — ABNORMAL HIGH (ref 1.4–6.5)
Neutrophil %: 70.8 %
Platelet: 184 10*3/uL (ref 150–440)
RBC: 3.49 10*6/uL — AB (ref 3.80–5.20)
RDW: 16.2 % — ABNORMAL HIGH (ref 11.5–14.5)
WBC: 13.1 10*3/uL — ABNORMAL HIGH (ref 3.6–11.0)

## 2014-03-20 LAB — BASIC METABOLIC PANEL
Anion Gap: 7 (ref 7–16)
BUN: 13 mg/dL (ref 7–18)
CALCIUM: 8.4 mg/dL — AB (ref 8.5–10.1)
CO2: 24 mmol/L (ref 21–32)
Chloride: 104 mmol/L (ref 98–107)
Creatinine: 1.09 mg/dL (ref 0.60–1.30)
EGFR (African American): 55 — ABNORMAL LOW
EGFR (Non-African Amer.): 48 — ABNORMAL LOW
Glucose: 85 mg/dL (ref 65–99)
OSMOLALITY: 269 (ref 275–301)
POTASSIUM: 4.1 mmol/L (ref 3.5–5.1)
Sodium: 135 mmol/L — ABNORMAL LOW (ref 136–145)

## 2014-03-21 LAB — CLOSTRIDIUM DIFFICILE(ARMC)

## 2014-03-21 LAB — HEMOGLOBIN: HGB: 9.8 g/dL — ABNORMAL LOW (ref 12.0–16.0)

## 2014-03-22 LAB — URINE CULTURE

## 2014-04-05 ENCOUNTER — Encounter: Payer: Self-pay | Admitting: Cardiology

## 2014-04-19 ENCOUNTER — Ambulatory Visit
Admit: 2014-04-19 | Disposition: A | Discharge status: Home / Self Care | Payer: Self-pay | Attending: Nurse Practitioner | Admitting: Nurse Practitioner

## 2014-04-20 ENCOUNTER — Telehealth: Payer: Self-pay | Admitting: Internal Medicine

## 2014-04-20 NOTE — Telephone Encounter (Signed)
New Message:  Dorothy [name] at [facility] called to let us know they will be doing the pt's device checks and to cancel the pts upcoming appt... Just wanted to make Dr. Ermalinda Barrios clinic aware.Marland KitchenMarland Kitchen

## 2014-05-20 ENCOUNTER — Ambulatory Visit
Admit: 2014-05-20 | Disposition: A | Discharge status: Home / Self Care | Payer: Self-pay | Attending: Nurse Practitioner | Admitting: Nurse Practitioner

## 2014-05-25 NOTE — Telephone Encounter (Signed)
Noted  

## 2014-06-05 ENCOUNTER — Ambulatory Visit: Payer: Self-pay | Admitting: Internal Medicine

## 2014-06-13 ENCOUNTER — Encounter: Payer: Medicare Other | Admitting: Cardiology

## 2014-06-14 ENCOUNTER — Encounter: Payer: Medicare Other | Admitting: Internal Medicine

## 2014-06-16 ENCOUNTER — Ambulatory Visit: Payer: Medicare Other | Admitting: Nurse Practitioner

## 2014-07-06 ENCOUNTER — Other Ambulatory Visit (HOSPITAL_COMMUNITY): Payer: Self-pay | Admitting: Interventional Radiology

## 2014-07-06 DIAGNOSIS — I6350 Cerebral infarction due to unspecified occlusion or stenosis of unspecified cerebral artery: Secondary | ICD-10-CM

## 2014-07-06 DIAGNOSIS — Z8679 Personal history of other diseases of the circulatory system: Secondary | ICD-10-CM

## 2014-07-06 DIAGNOSIS — I63239 Cerebral infarction due to unspecified occlusion or stenosis of unspecified carotid arteries: Secondary | ICD-10-CM

## 2014-07-06 DIAGNOSIS — S069X0D Unspecified intracranial injury without loss of consciousness, subsequent encounter: Secondary | ICD-10-CM

## 2014-07-06 DIAGNOSIS — G8110 Spastic hemiplegia affecting unspecified side: Secondary | ICD-10-CM

## 2014-07-06 DIAGNOSIS — I63219 Cerebral infarction due to unspecified occlusion or stenosis of unspecified vertebral arteries: Secondary | ICD-10-CM

## 2014-07-06 DIAGNOSIS — I639 Cerebral infarction, unspecified: Secondary | ICD-10-CM

## 2014-08-03 ENCOUNTER — Encounter: Payer: Self-pay | Admitting: *Deleted

## 2014-09-19 DEATH — deceased

## 2014-09-28 ENCOUNTER — Encounter (HOSPITAL_COMMUNITY): Payer: Self-pay | Admitting: Internal Medicine

## 2015-02-09 NOTE — Consult Note (Signed)
Brief Consult Note: Diagnosis: NSTEMI (chronic LBBB), chronic systolic heart failure, recent stroke, A-fib, severe malnutrition.   Patient was seen by consultant.   Consult note dictated.   Comments: Continue serial cardiac enzymes. Consider Heparin for 24-48 hours if TnI treands up.  Recommend medical therapy and reserve invasvie therapy only for refractory symptoms.  Electronic Signatures: Lorine Bears (MD)  (Signed 26-Dec-14 18:32)  Authored: Brief Consult Note   Last Updated: 26-Dec-14 18:32 by Lorine Bears (MD)

## 2015-02-09 NOTE — H&P (Signed)
PATIENT NAME:  Dorothy [name], Dorothy [name] MR#:  [id number] DATE OF BIRTH:  29-Jan-1931  DATE OF ADMISSION:  09/01/2013  PRIMARY CARE PROVIDER: Lyndon Code, MD  EMERGENCY DEPARTMENT REFERRING PHYSICIAN: Dorothea Glassman, MD  CHIEF COMPLAINT: Chest pain, shortness of breath, nausea, vomiting.   HISTORY OF PRESENT ILLNESS: The patient is an 81 year old white female with history of having hypothyroidism, hypertension, who reports that over the past few months, she has lost at least 25 pounds over the summertime, who has not been feeling well, but about 2 days ago, she started having left-sided chest pain, lasting about 10 minutes. She described it as a sharp pain that took away her breath and also has been having shortness of breath with minimal exertion. She also, associated with this, had associated nausea and has been having vomiting. She has not been able to eat much. She denies any difficulty with swallowing or any substernal discomfort. She denies any fevers or chills. She complains of right-sided back pain yesterday that was transient. She almost fainted today and felt very weak and tired. She has not had any diarrhea. She otherwise denies any blood in the stool. No urinary frequency, urgency or hesitancy.   PAST MEDICAL HISTORY: Significant for hypothyroidism, hypertension.   PAST SURGICAL HISTORY: Status post bilateral knee arthroscopies, right shoulder surgery, appendectomy, history of right greater trochanteric fracture which was not repaired, bladder tack.   ALLERGIES: None.   CURRENT HOME MEDICATIONS:  1. Aspirin 81 one tab p.o. daily.  2. Biotin 100 one tab p.o. daily.  3. Calcium plus vitamin D 1 tab p.o. daily. 4. Centrum Silver 1 tab p.o. daily.  5. Hydrochlorothiazide 25/37.5 mg 1 tab p.o. daily. 6. Osteo Bi-Flex with ascorbic acid 1 tab p.o. daily. 7. Vear Clock' Colon Health 1 tab p.o. daily. 8. Synthroid 75 mcg daily.  9. Tramadol 50 one tab p.o. b.i.d. 10. Vitamin B 12,000 mcg 2 tabs  daily.  11. Vitamin D3 2000 daily.   SOCIAL HISTORY: Does not smoke. Does not drink. No drugs. Lives with her husband at home.   FAMILY HISTORY: Positive for hypertension.   REVIEW OF SYSTEMS:  CONSTITUTIONAL: Denies any fevers. Complains of fatigue, weakness. Complains of chest pain. Complains of weight loss.  EYES: No blurred or double vision. No pain. No redness. No inflammation. No glaucoma. No cataracts.  ENT: No tinnitus. No ear pain. No hearing loss. No seasonal or year-round allergies. No epistaxis. No nasal discharge. No difficulty swallowing.  RESPIRATORY: Denies any cough, wheezing, hemoptysis. No asthma. Does complain of dyspnea.  CARDIOVASCULAR: Complains of chest pain, orthopnea. No edema. No arrhythmias. Complains of dyspnea on exertion.  GASTROINTESTINAL: Complains of nausea, vomiting. No diarrhea. No abdominal pain. No hematemesis. No melena. No ulcer.  GENITOURINARY: Denies any dysuria, hematuria, renal calculus or frequency.  ENDOCRINE: Denies any polyuria, nocturia. Has history of hypothyroidism.  HEMATOLOGIC AND LYMPHATIC: Denies anemia, easy bruisability or bleeding.  SKIN: No acne or rash. No changes in mole, hair or skin.  MUSCULOSKELETAL: Has pain in back.  NEUROLOGIC: No numbness. No CVA. No TIA. No seizures.  PSYCHIATRIC: No anxiety. No insomnia. No ADD.   PHYSICAL EXAMINATION:  VITAL SIGNS: Temperature 97.5, pulse 85, respirations 18, blood pressure 121/57, O2 98%.  GENERAL: The patient is a well-developed, well-nourished female in no acute distress.  HEENT: Head atraumatic, normocephalic. Pupils equally round and reactive to light and accommodation. There is no conjunctival pallor. No scleral icterus. Nasal exam shows no drainage or ulceration. Oropharynx is clear, without  any exudate.  NECK: Supple, without any JVD.  CARDIOVASCULAR: Regular rate and rhythm. No murmurs, rubs, clicks or gallops. PMI is not displaced.  ABDOMEN: Soft, nontender, nondistended.  Positive bowel sounds x4. No hepatosplenomegaly.  GENITOURINARY: Deferred.  MUSCULOSKELETAL: There is no erythema or swelling.  SKIN: No rash.  LYMPHATICS: No lymph nodes palpable.  VASCULAR: Good DP, PT pulses.  PSYCHIATRIC: Not anxious or depressed.  NEUROLOGIC: Awake, alert and oriented x3. No focal deficits.   EVALUATIONS: In the ED, include an EKG which shows left bundle branch block, which was noted on previous EKG, nonspecific ST-T wave changes. Her chest x-ray showed no acute cardiopulmonary processes. BMP: Glucose 121, BUN 37, creatinine 1.44, sodium 134, potassium 3.8, chloride 101, CO2 26, calcium 10. LFTs were normal except bilirubin total of 1.1, AST 55. TSH 2.26.   ASSESSMENT AND PLAN: The patient is an 81 year old white female presenting with chest pain, nausea, vomiting, weight loss, acute renal failure.   1. Chest pain, appears to be atypical. I will ask cardiology to consult to see if there is any further evaluation that needs to be done. Will also review her echo to see if there is any wall motion abnormality. I will place her on aspirin. Check serial enzymes.  2. Shortness of breath with a mildly elevated d-dimer. I will go ahead and get a V/Q scan and an echocardiogram to make sure she does not have a pulmonary embolism or any significant valvular dysfunction.  3. Hypothyroidism. Will continue supplements. Her TSH currently is normal.  4. Weight loss. I am going to check a sedimentation rate, B12 level and vitamin D level. The patient will need outpatient workup for screening for malignancies, including colonoscopy, mammogram, etc. 5. Acute renal failure. Will hold her hydrochlorothiazide/triamterene. Give her IV fluids. Monitor her blood pressure and renal function.  6. Miscellaneous. Will do heparin for deep vein thrombosis prophylaxis.   TIME SPENT: 45 minutes spent on this patient.    ____________________________ Lacie Scotts. Allena Katz, MD shp:lb D: 09/01/2013 11:24:55  ET T: 09/01/2013 11:35:43 ET JOB#: [id number]  cc: Gideon Burstein H. Allena Katz, MD, <Dictator> Charise Carwin MD ELECTRONICALLY SIGNED 09/02/2013 16:56

## 2015-02-09 NOTE — Discharge Summary (Signed)
PATIENT NAME:  Dorothy [name], Dorothy [name] MR#:  [id number] DATE OF BIRTH:  03-13-1931  DATE OF ADMISSION:  09/01/2013 DATE OF DISCHARGE:  09/02/2013  DISCHARGE DIAGNOSES: 1.  Chest pain, likely secondary from vomiting.  2.  Hypothyroidism.  3.  Weight loss.  4.  Acute renal failure.  5.  Dehydration.   CONSULTANTS: Dr. Welton Flakes with cardiology.   IMAGING STUDIES: Done include chest x-ray, portable, which showed no acute abnormalities.   V/Q scan showed low probability for a PE.   Echocardiogram showed EF of 45% to 50%.   ADMITTING HISTORY AND PHYSICAL AND HOSPITAL COURSE: Please see detailed H and P dictated previously by Dr. Allena Katz. In brief, an 81 year old patient presented to the hospital with some chest pain, vomiting, nausea and weight loss. The patient was admitted to rule out acute coronary syndrome and IV fluid resuscitation for dehydration.   The patient's nausea, vomiting, diarrhea had resolved during the hospital stay, and dehydration  resolved with IV fluids. Her chest pain was thought to be secondary to vomiting, which had resolved by the time the patient arrived on the floor. Dr. Welton Flakes with cardiology saw the patient. Echocardiogram was checked, which showed no wall motion abnormalities, and an outpatient Lexiscan has been scheduled at his office, and the patient is being discharged home. The patient will follow up with primary care physician regarding her weight loss, poor appetite. The patient will need outpatient colonoscopy.   Prior to discharge, the patient does not have any chest pain. No nausea, vomiting, tolerated food well, ambulated, and will be discharged home.   DISCHARGE MEDICATIONS: Include:  1.  Calcium and vitamin D 1 tablet oral once a day.  2.  Biotin 1000 mcg oral daily.  3.  Vitamin D3, 2000 International Units oral daily.  4.  Aspirin 81 mg daily.  5.  Vitamin B12, 1000 mcg 2 tablets daily.  6.  Hydrochlorothiazide/triamterene 25/37.5, 1 tablet oral once a day.  7.   Synthroid 75 mcg oral once a day.  8.  Tramadol 50 mg oral 2 times a day.  9.  Zofran 4 mg oral every 6 hours as needed for nausea or vomiting.   DISCHARGE INSTRUCTIONS: Low-sodium diet. Activity as tolerated. Ensure Plus 3 times a day. Follow up with Dr. Beverely Risen, primary care physician, in 1 week.   Time spent on day of discharge in discharge activity was 40 minutes.    ____________________________ Dorothy Bailiff Dorothy Heckstall, MD srs:dmm D: 09/12/2013 14:27:49 ET T: 09/12/2013 20:58:09 ET JOB#: [id number]  cc: Wardell Heath R. Elpidio Anis, MD, <Dictator> Lyndon Code, MD Laurier Nancy, MD Orie Fisherman MD ELECTRONICALLY SIGNED 09/13/2013 16:20

## 2015-02-09 NOTE — Consult Note (Signed)
PATIENT NAME:  Dorothy [name], Dorothy [name] MR#:  [id number] DATE OF BIRTH:  [birth year]  DATE OF CONSULTATION:  09/01/2013  REFERRING PHYSICIAN: Auburn Bilberry, MD  CONSULTING PHYSICIAN:  Adrian Blackwater, MD  PRIMARY CARE PHYSICIAN: Beverely Risen, MD.   REASON FOR CONSULTATION: Chest pain.   HISTORY OF PRESENT ILLNESS: [name] is an 81 year old white female with a past medical history significant for hypertension and hypothyroidism. The patient notes that since last Sunday, she has been experiencing pain in the left lower chest. The pain was sharp and burning in nature, better when she pressed on it and lasted approximately 10 minutes. The pain was non-exertional. She was sitting on the couch when the episode happened. She had 2 other episodes of chest pain last Sunday and has not had any since. She complains of increased shortness of breath that was worse this morning. The shortness breath is worse with minimal exertion and there has not been any coughing or wheezing. She has 1 pillow orthopnea. Denies any palpitations, fast heart rate or PND. She does have some intermittent edema in her right leg, which is greater than the left leg. Of note, the patient has had nausea and vomiting and 25 pound weight loss since the summer. While in the shower this morning, she had presyncopal episode and fell.   PAST MEDICAL HISTORY:  1.  Hypothyroidism. 2.  Hypertension.   PAST SURGICAL HISTORY:  1.  Status post bilateral knee arthroscopy. 2.  Right shoulder surgery. 3.  Appendectomy.  4.  History of right greater trochanteric fracture, not surgically repaired.  5.  Bladder tack.   ALLERGIES: No known drug allergies.   HOME MEDICATIONS:   1.  Aspirin 81 mg p.o. daily.  2.  Biotin 100 mg p.o. daily.  3.  Calcium plus vitamin D 1 tab daily.  4.  Centrum Silver 1 tablet p.o. daily.  5.  Hydrochlorothiazide/triamterene 25/37.5 mg 1 tablet p.o. daily.  6.  Osteo Bi-Flex plus ascorbic acid 1 tablet p.o. daily.   7.  Vear Clock colon health 1 tab daily.  8.  Synthroid 75 mcg daily.  9.  Tramadol 50 mg p.o. b.i.d.  10. Vitamin B12 1000 mcg twice daily.  11. Vitamin D3 2000 mg daily.   SOCIAL HISTORY: The patient lives with her husband and a dog at home. She does not smoke, use any alcohol or illicit street drugs.   FAMILY HISTORY: Positive for hypertension, mother had coronary artery disease with a heart attack at age 63, father had oral cancer and died at age 85.   REVIEW OF SYSTEMS: CONSTITUTIONAL: The patient denies any fevers. Complains of fatigue, weakness, weight loss. EYES: The patient wears glasses. She denies any blurred vision, double vision. EARS, NOSE AND THROAT: The patient denies any tinnitus, hearing loss, or epistaxis. RESPIRATORY: The patient complains of increased shortness of breath and orthopnea. Does not have any coughing. CARDIOVASCULAR: The patient has had intermittent left-sided chest pain and is not having any currently. Denies any palpitations. GASTROINTESTINAL: The patient complains of nausea and vomiting. Has not had any diarrhea. Denies any significant abdominal pain.   PHYSICAL EXAMINATION:  GENERAL: This is a pleasant, elderly female who is not in any acute distress. She is alert and oriented x 3.  VITAL SIGNS: Temperature 97.5 degrees Fahrenheit, heart rate 75, respiratory rate 18, blood pressure 122/60 and oxygen saturation 98% on room air.  HEENT: Head atraumatic, normocephalic. Eyes: Pupils are round and equal. Conjunctivae are pink. Ears and nose are normal  to external inspection. Mouth: Moist mucous membranes.  NECK: Supple. Trachea is midline. There is no JVD. There are no carotid bruits.  RESPIRATORY/LUNGS: No accessory muscle use. Lungs are clear to auscultation bilaterally. No adventitious breath sounds noted.  CARDIOVASCULAR: Regular rate and rhythm. No murmurs, rubs, or gallops appreciated.  ABDOMEN: Nondistended. Bowel sounds present in all 4 quadrants. It is  soft. There is no hepatosplenomegaly.  EXTREMITIES: No cyanosis, clubbing, or edema. She does have a bandage on her right ankle and she has a small ulceration on the right outer aspect of her ankle.   ANCILLARY DATA: EKG with left bundle branch block is noted on her previous EKG, nonspecific ST-T wave changes, heart rate 89 beats per minute. Chest x-ray from November 13 no acute cardiopulmonary abnormality.   LABORATORY DATA: Glucose 121, BNP 3364, BUN 37, creatinine 1.4, sodium 134, potassium 3.8, chloride 101. Estimated GFR 34, total protein 7.1, albumin 3.8, bilirubin 1.1, alkaline phosphatase 95, AST 55 ALT 45. Total CK 126, CK-MB 5.2, troponin I 0.02. TSH is 2.26. White blood cell count 7.8, hemoglobin 14.8, hematocrit 43.0, platelet count 276,000. D-dimer 0.69.   ASSESSMENT AND PLAN:  1.  Chest pain. The patient's chest pain is sharp and occurs at rest and there are no new EKG changes as she has had left bundle branch block on a previous admission. The patient is on aspirin and heparin. I agree with echocardiogram to check for any wall motion abnormalities and to assess left ventricular ejection fraction and diastolic function. I agree with cycling her cardiac enzymes. 2.  Shortness of breath. The patient's shortness of breath could be secondary to pulmonary embolus as she had elevated d-dimer and V/Q scan has already been ordered. The patient also had mildly elevated BNP and will need to rule out congestive heart failure as the etiology. 3.  Hypertension. Blood pressure well controlled at this time.   We will continue to follow this patient with you and make further recommendations based on the results of her echocardiogram and cardiac enzymes. The patient is stable from a cardiac standpoint at this time. We will continue to follow this patient with you.  ____________________________ Verta Ellen, PA-C mam:aw D: 09/01/2013 14:06:47 ET T: 09/01/2013 14:19:27 ET JOB#: [id number]  cc: Verta Ellen, PA-C, <Dictator> Lyndon Code, MD Dontaye Hur A [facility] PA ELECTRONICALLY SIGNED 09/02/2013 15:12

## 2015-02-10 NOTE — Consult Note (Signed)
Chief Complaint:  Subjective/Chief Complaint Patient off the floor for surgery.  Notes and labs reviewed.  We will see again in the AM and I am available as needed.   VITAL SIGNS/ANCILLARY NOTES: **Vital Signs.:   31-May-15 11:25  Vital Signs Type 15 min Post Blood Start Time  Temperature Temperature (F) 97.9  Celsius 36.6  Temperature Source axillary  Pulse Pulse 70  Respirations Respirations 16  Systolic BP Systolic BP 145  Diastolic BP (mmHg) Diastolic BP (mmHg) 67  Mean BP 93  Pulse Ox % Pulse Ox % 97  Oxygen Delivery Room Air/ 21 %   Electronic Signatures: Rollene Rotunda (MD)  (Signed 31-May-15 14:19)  Authored: Chief Complaint, VITAL SIGNS/ANCILLARY NOTES   Last Updated: 31-May-15 14:19 by Rollene Rotunda (MD)

## 2015-02-10 NOTE — Consult Note (Signed)
Brief Consult Note: Diagnosis: Left hip intertrochanteric fracture.   Patient was seen by consultant.   Recommend to proceed with surgery or procedure.   Recommend further assessment or treatment.   Orders entered.   Discussed with Attending MD.   Comments: 81 year old female fell at [facility] last night injuring the left hip and ankle.  Brought to Emergency Room where exam and X-rays show a comminuted displaced left intertrochanteric hip fracture and non displaced lateral malleolus fracture.  Also noted was an evolving CVA with ct scan 5 days ago and today.  Also she is on Eliquis for anticoagulation.  Discussed treatment with patient, husband, and daughter as well as Dr Judithann Sheen.  Family would like her to have surgery for pain control if possible.  She is scheduled for repeat ct scan tomorrow AM and will be scheduled for surgery based on these results.  Risks and benefits of surgery were discussed at length including but not limited to infection, non union, nerve or blood vessed damage, non union, need for repeat surgery, blood clots and lung emboli, and death. Family understands that she is very high risk.    Exam: Alert but confused female.  circulation/sensation/motor function good left leg.  Pain with motion of hip and ankle with mild swelling. Skin intact.  Left leg slightly shortened.    X-rays: as above  Imp: Left intertrochanteric hip fracture/ ankle fracture with multiple medical problems.   Rx:  open reduction and internal fixation left hip with Trochanteric Fixation Nail if cleared medically, tomorrow or Monday.  Will need general anesthesia due to Eliquis.  Electronic Signatures: Valinda Hoar (MD)  (Signed 30-May-15 16:35)  Authored: Brief Consult Note   Last Updated: 30-May-15 16:35 by Valinda Hoar (MD)

## 2015-02-10 NOTE — H&P (Signed)
PATIENT NAME:  Dorothy [name], Dorothy [name] MR#:  [id number] DATE OF BIRTH:  [birth year]  DATE OF ADMISSION:  03/18/2014  REFERRING PHYSICIAN: Sheryl L. Mindi Junker, MD  FAMILY PHYSICIAN: Serita Sheller. Maryellen Pile, MD   REASON FOR ADMISSION: Left hip fracture.   HISTORY OF PRESENT ILLNESS: The patient is an 81 year old female who resides at [facility]. The patient has a history of atrial fibrillation and sick sinus syndrome status post pacemaker implant. Is on Eliquis. Also has a history of stroke back in December. Is ambulatory with assistance. Larey Seat today, injuring her left side. Was brought to the Emergency Room, where she was noted to have a left hip fracture and is now admitted for further evaluation.   PAST MEDICAL HISTORY:  1. Chronic atrial fibrillation.  2. Sick sinus syndrome status post pacemaker implant.  3. Stroke with left-sided weakness.  4. Chronic dysphagia.  5. Benign hypertension.  6. COPD.  7. Hyperlipidemia.  8. History of pneumonia.  9. Vascular dementia.  10. Hypothyroidism.  11. Previous hysterectomy.  12. Previous right femur fracture.   MEDICATIONS:  1. Toprol-XL 25 mg p.o. daily.  2. Vitamin D 1000 units p.o. daily.  3. Ultram 50 mg p.o. b.i.d.  4. Synthroid 100 mcg p.o. daily.  5. Seroquel 25 mg p.o. at bedtime.  6. Melatonin 3 mg p.o. at bedtime.  7. Eliquis 2.5 mg p.o. b.i.d.  8. Depakote 250 mg p.o. at bedtime.  9. Amiodarone 200 mg p.o. daily.   ALLERGIES: No known drug allergies.   SOCIAL HISTORY: Negative for alcohol or tobacco abuse.   FAMILY HISTORY: Positive for coronary artery disease, hypertension and stroke.   REVIEW OF SYSTEMS:  CONSTITUTIONAL: No fever or change in weight.  EYES: No blurred or double vision. No glaucoma.  ENT: No tinnitus or hearing loss. No nasal discharge or bleeding. No difficulty swallowing.  RESPIRATORY: No cough or wheezing. Denies hemoptysis.  CARDIOVASCULAR: No chest pain or orthopnea. No palpitations or syncope.   GASTROINTESTINAL: No nausea, vomiting or diarrhea. No abdominal pain. No change in bowel habits.  GENITOURINARY: No dysuria or hematuria. No incontinence.  ENDOCRINE: No polyuria or polydipsia. No heat or cold intolerance.  HEMATOLOGIC: The patient denies anemia, easy bruising or bleeding.  LYMPHATIC: No swollen glands.  MUSCULOSKELETAL: The patient has pain in her left hip and leg. Denies neck or back pain. No gout.  NEUROLOGIC: No numbness or migraines. Denies seizures.  PSYCHIATRIC: The patient denies anxiety, insomnia or depression.   PHYSICAL EXAMINATION:  GENERAL: The patient is chronically ill-appearing, in no acute distress.  VITAL SIGNS: Remarkable for a blood pressure of 144/67 with a heart rate of 77, respiratory rate of 18, temperature of 98.9.  HEENT: Normocephalic, atraumatic. Pupils equal, round and reactive to light and accommodation. Extraocular movements are intact. Sclerae are nonicteric. Conjunctivae are clear. Oropharynx is clear.  NECK: Supple without JVD. No adenopathy or thyromegaly is noted.  LUNGS: Clear to auscultation and percussion without wheezes, rales or rhonchi. No dullness. Respiratory effort is normal.  CARDIAC: Regular rate and rhythm with a normal S1 and S2. No significant rubs, murmurs or gallops. PMI is nondisplaced. Chest wall is nontender.  ABDOMEN: Soft, nontender, with normoactive bowel sounds. No organomegaly or masses were appreciated. No hernias or bruits were noted.  EXTREMITIES: Without clubbing, cyanosis or edema. Pulses were 2+ bilaterally.  SKIN: Warm and dry without rash or lesions.  NEUROLOGIC: Cranial nerves II through XII grossly intact. Deep tendon reflexes were symmetric. There was 4 out  of 5 strength in the left upper extremity. The left lower extremity was not evaluated.  PSYCHIATRIC: Revealed a patient who was alert and oriented to person, but not to place or time. She was cooperative.   LABORATORY DATA: Chest x-ray unremarkable. EKG  reveals a paced rhythm. Hip films show the left hip fracture. Urinalysis reveals trace bacteria with positive leukocyte esterase and 26 WBCs per high-power field. White count is 15.3 with a hemoglobin of 10.8. Glucose 108 with a BUN of 30, creatinine 1.41 and GFR of 35.   ASSESSMENT:  1. Left hip fracture.  2. Sick sinus syndrome, status post pacemaker implant.  3. Chronic atrial fibrillation.  4. Stage III chronic kidney disease.  5. Anemia of chronic disease.  6. Vascular dementia.  7. Previous stroke.   PLAN: The patient will be admitted to orthopedics as a no code blue, do not resuscitate. Will hold anticoagulation for now. Will obtain orthopedic and cardiology consults. Begin IV fluids and IV antibiotics. Send off a urine culture. She appears stable for surgery later today. Will keep n.p.o. Follow up routine labs in the morning. Further treatment and evaluation will depend upon the patient's progress.   TOTAL TIME SPENT ON THIS PATIENT: 50 minutes.   ____________________________ Duane Lope Judithann Sheen, MD jds:lb D: 03/18/2014 11:03:06 ET T: 03/18/2014 11:15:00 ET JOB#: [id number]  cc: Duane Lope. Judithann Sheen, MD, <Dictator> Serita Sheller. Maryellen Pile, MD JEFFREY Rodena Medin MD ELECTRONICALLY SIGNED 03/18/2014 16:10

## 2015-02-10 NOTE — Discharge Summary (Signed)
PATIENT NAME:  Dorothy [name], Dorothy [name] MR#:  [id number] DATE OF BIRTH:  Oct 10, 1931  DATE OF ADMISSION:  10/14/2013 DATE OF DISCHARGE:  10/15/2013  DISCHARGE DIAGNOSES: 1.  Chest pain. Troponin entirely stable myocardial infarction ruled out.  2.  Chronic systolic heart failure.  3.  Recent cerebrovascular accident.  4.  Chronic weight loss and loss of appetite.  5.  Hyperlipidemia.  6.  Hypothyroidism.   CONDITION ON DISCHARGE: Stable.   [name] STATUS: No [name] BLUE.   MEDICATIONS ON DISCHARGE: 1.  Amiodarone 200 mg oral tablet 2 times a day.  2.  Atorvastatin 20 mg once a day.  3.  Calcium carbonate with vitamin D once a day.  4.  Diltiazem 30 mg oral tablet every 12 hour.  5.  Apixaban 2.5 mg oral tablet 2 times a day.  6.  Famotidine 20 mg once a day.  7.  Levothyroxine 75 mcg once a day.  8.  Osteo Bi-Flex triple strength one tablet once a day.   9.  Docusate and senna 2 tablets 2 times a day.  10.  Vitamin D3 1000 international units once a day.  11.  Nitroglycerin 0.4 mg sublingual tablet every 1 to 2 hours as needed for chest pain, try to do 3 times for each episode.  12. Potassium chloride 20 mEq oral tablet extended-release once a day.   DIET ON DISCHARGE: Regular.  DIET SUPPLEMENT: Ensure Plus advised to aspiration precaution during diet and follow up within 2 to 4 weeks.   ROUTINE FOLLOW-UP: With PMD, work-ups for weight loss including malignancy. If chest pain occurs try to give nitroglycerin tablets for 2 to 3 times and if pain persists, then transfer to ER.     HISTORY OF PRESENT ILLNESS: An 81 year old female with past medical history of hypertension, hypothyroidism, recent stroke and severe malnutrition and weight loss. She had a stroke two weeks ago and was sent to [facility]. After that, she was sent to [facility] for rehab. On the day of admission, she had severe chest pain and so she was sent to the hospital for further evaluation. The pain was pressure-like,  severe and she was not able to breath properly with that and so she decided to come to the Emergency Room. She was found having slightly hypotensive and EKG was found having some abnormality J-point elevation and troponin was 0.09, so admitted for further management. The patient was taking Eliquis for atrial fibrillation, last dose was in the morning, so spoke to Dr. Kirke [name] and he suggested not to start on any anticoagulation at this time, but if troponin goes high then after 12 hours of the last Eliquis, the patient could be started on heparin IV drip.   HOSPITAL COURSE AND STAY:  1.  Troponin remained stable, came up to 0.12. Cardiology consult was called in and they agreed with not to treat aggressively for this and advised to have nursing home to give nitroglycerin tablets first before sending the patient to ER. Echocardiogram was done at [facility],  which was  and ejection fraction was 35% as per those records.  2.  Hypokalemia. We replaced it with IV.  3.  Recent stroke. The patient was on honey thick diet at SNF and we continued the same.  4.  Atrial fibrillation was amiodarone and Eliquis and we resumed that.  5.  Hyperlipidemia. We continued statin.  6.  Hypothyroidism and levothyroxine.  7.  Severe malnutrition, status post stroke and weight loss, started on megestrol.  Spoke to her husband and her PMD, Dr. Welton Flakes was working to find out the cause for chronic weight loss for the last two years. We discharged her back to the nursing home.   CONSULT IN THE HOSPITAL: Dr. Harriett Sine Phifer for palliative care and Dr. Lorine Bears for cardiology.   IMPORTANT LABORATORY RESULTS IN THE HOSPITAL:  Troponin was 0.09 on admission, which went up to 0.13 and came down 0.12.  White cell count 6.4 on admission, hemoglobin 12.3 and platelet count was 238.  BUN 9, creatinine 1.11, sodium 141, potassium 2.3, and chloride 102.   Chest x-ray, PA and lateral, was done which showed interstitial infiltrate  improved pulmonary  edema, small residual bilateral effusion. This was on the follow-up the next day.   TOTAL TIME SPENT ON THIS DISCHARGE: 40 minutes.   ____________________________ Dorothy [name] Elisabeth Pigeon, MD vgv:cc D: 10/19/2013 17:43:21 ET T: 10/19/2013 20:29:00 ET JOB#: [id number]  cc: Dorothy [name]. Elisabeth Pigeon, MD, <Dictator> Dorothy Code, MD Dorothy Aus. Kirke Corin, MD  Dorothy Dilling MD ELECTRONICALLY SIGNED 10/23/2013 22:01

## 2015-02-10 NOTE — H&P (Signed)
PATIENT NAME:  Dorothy [name], Dorothy [name] MR#:  [id number] DATE OF BIRTH:  1931-02-01  DATE OF ADMISSION:  10/14/2013  PRIMARY CARE PHYSICIAN: Dr. Beverely Risen   REFERRING ER PHYSICIAN: Dr. Loleta Rose   CHIEF COMPLAINT: Chest pain.   HISTORY OF PRESENT ILLNESS: An 81 year old female with past medical history of hypertension, hypothyroidism, recent stroke and severe malnutrition and weight loss. Had a stroke two weeks ago. Was sent to [facility] and after that she was sent to [facility] for rehab. Today over there, she had severe chest pain and so she was sent to the hospital for further evaluation. As per her, the pain was pressure-like, severe.  She was not able to be breath properly with that and was crushing type on the mid and left side of the chest. Not associated with any position and pain got relieved after almost an hour.  On presentation to the ER, she was slightly hypotensive and her EKG was found having some abnormality, J-point elevation and troponin was elevated at 0.09, so the ER physician called Dr. Kirke Corin who is on call, and Dr. Kirke Corin came, saw the patient, reviewed EKG and suggested that as the patient is taking Eliquis for her atrial fibrillation, last dose was this morning, so she should not start on any anticoagulation at this time but continue monitoring and if it goes higher, then she might be started on anticoagulation after 12 hours of Eliquis.  So, the hospitalist service is contacted for further management.   REVIEW OF SYSTEMS:  CONSTITUTIONAL: Negative for fever, fatigue, weakness, but positive for weight loss, almost 20 to 25 pounds over the last few months.  EYES: No blurring, double vision, discharge, or redness.  EARS, NOSE, THROAT: No tinnitus, ear pain or hearing loss.  RESPIRATORY: No cough, wheezing, hemoptysis or shortness of breath.  CARDIOVASCULAR: Had chest pain, but no orthopnea, edema, arrhythmia or palpitations.  GASTROINTESTINAL: No nausea, vomiting, diarrhea  or abdominal pain.  GENITOURINARY: No dysuria, hematuria, or increased frequency.  ENDOCRINE: No heat or cold intolerance. No increased sweating.  SKIN: No acne, rashes or lesions.  MUSCULOSKELETAL: No pain or swelling in the joints.  NEUROLOGICAL: No numbness, weakness. Has some leftover mild left-sided weakness after stroke.  PSYCHIATRIC: No anxiety, insomnia, bipolar disorder.   PAST MEDICAL HISTORY: 1.  Hypothyroidism.  2.  Hypertension.  3.  Cerebrovascular accident in December 2014.  4.  Atrial fibrillation on amiodarone and Eliquis.   PAST SURGICAL HISTORY: Status post bilateral knee arthroscopies, right shoulder surgery, appendectomy, right greater trochanter fracture, bladder tack.   SOCIAL HISTORY: She does not smoke. No drinking, no drugs. Was living with husband at home, but since the last few weeks because of recurrent admissions, first to [facility] and then to [facility] hospital for stroke, she is right now in [facility] for rehab.   FAMILY HISTORY: Positive for hypertension.   HOME MEDICATIONS: As per the documents provided from nursing home: 1.  Amiodarone 200 mg twice a day.  2.  Atorvastatin 20 mg once a day.  3.  Cerovite senior  once a day.  4.  Eliquis 2.5 mg twice a day.  5.  Famotidine once a day.  6.  Fluconazole 100 mg once a day.  7.  Levothyroxine 75 mcg once a day.  8.  Lorazepam 0.5 mg every 8 hours as needed.  9.  Osteo-Biflex once a day.  10.  Senna twice a day. 11.  Vitamin D3 1000 units once a day.  PHYSICAL EXAMINATION: VITAL SIGNS: In the ER, temperature 98.1, pulse 88, respirations 24, blood pressure was 85/52 on presentation and pulse oximetry was 98% on room air.  GENERAL: The patient is fully alert and oriented to time, place, and person in mild distress, but she appears severe cachectic and thin.  HEAD AND NECK:  Severe muscle wasting present. Severe malnutrition. Conjunctiva pink. Oral mucosa dry.  NECK: Supple. No JVD.   RESPIRATORY: Bilateral clear and equal air entry.  CARDIOVASCULAR: S1, S2 present, irregular. No murmur.  ABDOMEN: Soft, nontender. Bowel sounds present. No organomegaly.  SKIN: No rashes.  LEGS: No edema.  NEUROLOGICAL: Power 4/5 in all 4 limbs, slightly decreased power on the left side compared to right side. No tremors or rigidity.  JOINTS: No swelling or tenderness.  PSYCHIATRIC: Does not appear in any acute psychiatric illness at this time.   IMPORTANT LABORATORY RESULTS: Glucose 107, BUN 9, creatinine 1.11, sodium 141, potassium 2.3, chloride 102, CO2 33, calcium 8.5, magnesium 1.8, total protein 6.1, albumin 2.7, bilirubin 0.6, alkaline phosphate , SGOT 55, SGPT 52. Troponin 0.09. WBC 6.4, hemoglobin 12.3, platelets 238 and MCV 94. Chest x-ray, PA and lateral, shows improved interstitial infiltrate, likely reflecting improved pulmonary edema. Small residual basilar effusion. Minimal basilar atelectasis versus infiltrate.  EKG: Atrial fibrillation. Left bundle branch block with slight elevation of J-point in chest leads.  ASSESSMENT AND PLAN: An 81 year old female with history of recent cerebrovascular accident, atrial fibrillation, hyperlipidemia, and hypothyroidism, presented with chest pain and found having slightly elevated troponin and some EKG changes, with severe hypokalemia and severe malnutrition.  1.  Non-ST-elevation myocardial infarction. We will admit her to telemetry and follow serial troponins. Dr. York Cerise from ER already spoke to Dr. Kirke Corin and he saw the patient and suggested not to start on any anticoagulation at this time because the patient took Eliquis this morning. If troponin goes higher, heparin drip can be started after 12 hours of Eliquis dose, which was this morning, so the earliest we can start heparin would be tonight. So meanwhile, we will follow further troponins and if it continues to rise up, she would be a candidate for heparin IV drip. We will continue her on  aspirin and statin. Not a candidate for beta blocker as presented with slight hypotension and appears a little dry and malnourished.  Give some bolus of 500 mL IV fluid and blood pressure is stable currently so we will not give beta blocker or ACE inhibitor.  Overall,  prognosis is very poor because of severe malnutrition and will call palliative care consult for further management if we need to start her on heparin IV drip.  2.  Cerebrovascular accident. We will continue aspirin and statin.  3.  Atrial fibrillation. She was on amiodarone. We will continue that. She was on Eliquis.  We will hold that for now and wait for further troponin work-up.  4.  Hyperlipidemia. Continue statin.  5.  Hypothyroidism. Continue levothyroxine.  6.  Hypokalemia. We will replace potassium and recheck tomorrow.  7.  Severe malnutrition, status post stroke and weight loss. Will get speech and swallow evaluation.    CODE STATUS is DO NOT RESUSCITATE  provided from the nursing home with the patient.  Palliative care consult is called to determine further plan in case if she needs hepatin IV drip to start.    TOTAL TIME SPENT: 50 minutes in admission of this patient.    ____________________________ Hope Pigeon. Elisabeth Pigeon, MD vgv:dp D: 10/14/2013 16:11:51 ET  T: 10/14/2013 16:37:15 ET JOB#: [id number]  cc: Hope Pigeon. Elisabeth Pigeon, MD, <Dictator> Altamese Dilling MD ELECTRONICALLY SIGNED 10/23/2013 21:58

## 2015-02-10 NOTE — Discharge Summary (Signed)
Dates of Admission and Diagnosis:  Date of Admission 18-Mar-2014   Date of Discharge 22-Mar-2014   Admitting Diagnosis hip fracture   Final Diagnosis Fall and Fracture hip- s/p sx Intra cranial hemorrhage after fall- stable seen by neurologist. A Fib- was on eliquis- held due to Fall and [place]- not a candidate for it anymore. Dementia Anemia due to blood loss during sx- transfused per ortho. UTI    Chief Complaint/History of Present Illness an 81 year old female who resides at [facility]. The patient has a history of atrial fibrillation and sick sinus syndrome status post pacemaker implant. Is on Eliquis. Also has a history of stroke back in December. Is ambulatory with assistance. [place] [address] today, injuring her left side. Was brought to the Emergency Room, where she was noted to have a left hip fracture and is now admitted for further evaluation.   Allergies:  No Known Allergies:   Hepatic:  30-May-15 08:42   Bilirubin, Total 0.4  Alkaline Phosphatase 60 (45-117 NOTE: New Reference Range 09/09/13)  SGPT (ALT) 30  SGOT (AST) 26  Total Protein, Serum 7.1  Albumin, Serum  3.1  TDMs:  30-May-15 08:42   Valproic Acid, Serum  17 (50-100 POTENTIALLY TOXIC:  > 200 mcg/mL)  Routine BB:  30-May-15 08:42   Crossmatch Unit 1 Transfused  Crossmatch Unit 2 Transfused  Result(s) reported on 20 Mar 2014 at 07:00AM.  ABO Group + Rh Type A Positive  Antibody Screen NEGATIVE (Result(s) reported on 18 Mar 2014 at 10:25AM.)  Routine Micro:  30-May-15 08:41   Micro Text Report URINE CULTURE   ORGANISM 1                >100,000 CFU/ML STREPTOCOCCUS VIRIDANS   COMMENT                   -   ANTIBIOTIC                       Organism Name STREPTOCOCCUS VIRIDANS  Organism Quantity >100,000 CFU/ML  Specimen Source INDWELLING CATHETER  Organism 1 >100,000 CFU/ML STREPTOCOCCUS VIRIDANS  Culture Comment - (Result(s) reported on 22 Mar 2014 at 08:00AM.)  02-Jun-15 18:23   Micro Text  Report CLOSTRIDIUM DIFFICILE   C.DIFFICILE ANTIGEN       C.DIFFICILE GDH ANTIGEN : NEGATIVE   C.DIFFICILE TOXIN A/B     C.DIFFICILE TOXINS A AND B : NEGATIVE   INTERPRETATION            Negative for C. difficile.    ANTIBIOTIC                        Routine Chem:  30-May-15 08:42   Glucose, Serum  108  BUN  30  Creatinine (comp)  1.41  Sodium, Serum 139  Potassium, Serum 4.5  Chloride, Serum 103  CO2, Serum 25  Calcium (Total), Serum 9.2  Anion Gap 11  Osmolality (calc) 284  eGFR (African American)  40  eGFR (Non-African American)  35 (eGFR values <61m/min/1.73 m2 may be an indication of chronic kidney disease (CKD). Calculated eGFR is useful in patients with stable renal function. The eGFR calculation will not be reliable in acutely ill patients when serum creatinine is changing rapidly. It is not useful in  patients on dialysis. The eGFR calculation may not be applicable to patients at the low and high extremes of body sizes, pregnant women, and vegetarians.)  Cardiac:  30-May-15 08:42  Troponin I < 0.02 (0.00-0.05 0.05 ng/mL or less: NEGATIVE  Repeat testing in 3-6 hrs  if clinically indicated. >0.05 ng/mL: POTENTIAL  MYOCARDIAL INJURY. Repeat  testing in 3-6 hrs if  clinically indicated. NOTE: An increase or decrease  of 30% or more on serial  testing suggests a  clinically important change)  CPK-MB, Serum 1.7 (Result(s) reported on 18 Mar 2014 at 12:27PM.)  Routine Coag:  30-May-15 08:42   Prothrombin 14.0  INR 1.1 (INR reference interval applies to patients on anticoagulant therapy. A single INR therapeutic range for coumarins is not optimal for all indications; however, the suggested range for most indications is 2.0 - 3.0. Exceptions to the INR Reference Range may include: Prosthetic heart valves, acute myocardial infarction, prevention of myocardial infarction, and combinations of aspirin and anticoagulant. The need for a higher or lower target INR must  be assessed individually. Reference: The Pharmacology and Management of the Vitamin K  antagonists: the seventh ACCP Conference on Antithrombotic and Thrombolytic Therapy. TMHDQ.2229 Sept:126 (3suppl): [id number]. A HCT value >55% may artifactually increase the PT.  In one study,  the increase was an average of 25%. Reference:  "Effect on Routine and Special Coagulation Testing Values of Citrate Anticoagulant Adjustment in Patients with High HCT Values." American Journal of Clinical Pathology 2006;126:400-405.)  Routine Hem:  30-May-15 08:42   Hemoglobin (CBC)  10.8  WBC (CBC)  15.3  RBC (CBC)  3.52  Hematocrit (CBC)  32.8  Platelet Count (CBC) 282 (Result(s) reported on 18 Mar 2014 at 08:59AM.)  MCV 93  MCH 30.8  MCHC 33.0  RDW  17.8  31-May-15 03:36   Hemoglobin (CBC)  7.7    18:21   Hemoglobin (CBC)  10.9 (Result(s) reported on 19 Mar 2014 at 07:02PM.)  01-Jun-15 04:41   Hemoglobin (CBC)  10.6  02-Jun-15 06:09   Hemoglobin (CBC)  9.8 (Result(s) reported on 21 Mar 2014 at 07:01AM.)   Pertinent Past History:  Pertinent Past History 1. Chronic atrial fibrillation.  2. Sick sinus syndrome status post pacemaker implant.  3. Stroke with left-sided weakness.  4. Chronic dysphagia.  5. Benign hypertension.  6. COPD.  7. Hyperlipidemia.  8. History of pneumonia.  9. Vascular dementia.  10. Hypothyroidism.  11. Previous hysterectomy.  12. Previous right femur fracture.   Hospital Course:  Hospital Course 1. Left hip fracture. sx per ortho, pain management per ortho. has intracranial hematoma- so no anticoagulants- started on lovenox by Ortho- stopped.- pt was on eliquis for a fib- hold now. 2. Sick sinus syndrome, status post pacemaker implant. stable 3. Chronic atrial fibrillation. hold eliquis- will suggest to hold even on discharge due to intracranial bleed- not a candidate anymore.     appreciated cardiology consult. rate control. 4. Stage III chronic kidney disease.  stable- monitor. 5. Anemia of chronic disease and acute blood loss. - transfused as per ortho. hb stable now. 6. Vascular dementia. - NH resident. 7. Previous stroke. - NH resident- will go back there. 8. UTI- rocephin IV- cx - too small colonies. 9. Intracranial bleed- seen by neurology- stopped Eliquis.     Started on keppra for seizure prevention.   Condition on Discharge Stable   Code Status:  Code Status No Code/Do Not Resuscitate   DISCHARGE INSTRUCTIONS HOME MEDS:  Medication Reconciliation: Patient's Home Medications at Discharge:     Medication Instructions  calcarb with d 600 mg-400 intl units oral tablet  1 tab(s) orally once a day   cerovite senior therapeutic multiple  vitamins with minerals oral tablet  1 tab(s) orally once a day   vitamin d3 1000 intl units oral tablet  1 tab(s) orally once a day   amiodarone 200 mg oral tablet  1 tab(s) orally once a day   depakote sprinkles 125 mg oral delayed release capsule  2 caps (251m) orally once a day (at bedtime)   megace es 625 mg/5 ml oral suspension  5 milliliter(s) orally once a day   melatonin 3 mg oral tablet  1 tab(s) orally once (at bedtime)   metoprolol succinate er 25 mg oral tablet, extended release  1 tab(s) orally once a day   senna s 50 mg-8.6 mg oral tablet  1 tab(s) orally 2 times a day   synthroid 100 mcg (0.1 mg) oral tablet  1 tab(s) orally once a day   seroquel 25 mg oral tablet  1 tab(s) orally once a day (in the evening)   acetaminophen-hydrocodone 325 mg-5 mg oral tablet  1 tab(s) orally every 4 to 6 hours x 5 days, As Needed, pain , As needed, pain   alendronate 70 mg oral tablet  1 tab(s) orally once a week   cefuroxime 500 mg oral tablet  1 tab(s) orally every 12 hours x 2 days   ferrous sulfate 325 mg (65 mg elemental iron) oral tablet  1 tab(s) orally 2 times a day (with meals)   pantoprazole 40 mg oral delayed release tablet  1 tab(s) orally 2 times a day    STOP TAKING THE FOLLOWING  MEDICATION(S):    osteo bi-flex triple strength with ascorbic acid and minerals oral tablet: 1 tab(s) orally once a day eliquis 2.5 mg oral tablet: 1 tab(s) orally 2 times a day tylenol 325 mg oral tablet: 2 tab(s) orally every 6 hours, As Needed ultram 50 mg oral tablet: 1 tab(s) orally 2 times a day  Physician's Instructions:  Diet Regular   Activity Limitations As tolerated   Return to Work Not Applicable   Time frame for Follow Up Appointment 1-2 weeks  ortho clinic.     MEarnestine LeysE(Consultant): [facility], [address] [place] [id number]  KClayborn BignessM(Family Physician): [facility] [address] [place] [phone]  Electronic Signatures: VVaughan Basta(MD)  (Signed 04-Jun-15 18:34)  Authored: ADMISSION DATE AND DIAGNOSIS, CHIEF COMPLAINT/HPI, Allergies, PERTINENT LABS, PERTINENT PAST HISTORY, HOSPITAL COURSE, [place] PATIENT INSTRUCTIONS, Follow Up Physician   Last Updated: 04-Jun-15 18:34 by VVaughan Basta(MD)

## 2015-02-10 NOTE — Consult Note (Signed)
General Aspect Preop consult in a patient with hip fracture.   Present Illness The patient has a complicated cardiac history including a cardiomyopathy, chronic atrial fib and a NQWMI for which she was managed medically in December of last year.  She is status post a mechanical fall.  She is found to have multiple active issues.  She has hip fracture, distal fib fracture, an apparent evolving right middle cerebral artery CVA and a new rightr subdural hematoma.  She is being considered for surgery on her hip.  She is having pain with movement.  The patient has dementia and cannot provide any details.  However, her family does not report that she has had any recent symptoms such as chest pain or SOB.  She is basically nonambulatory.     PMH:   Cardiomyopathy:  EF 45% MR:  Moderate Atrial fib/Tachybrady:  Status post pacemaker (atrial lead apparently dislodged.  VVI mode), chronic anticoagulation.   CVA:  12/14 treated with tPA HTN Hyperlipidemia Vascular dementia Hypothyroidism CKD Hysterectomy DNR   Physical Exam:  GEN no acute distress, cachectic, thin   HEENT Dentures.  Left pupil larger than right   NECK supple  No masses   RESP normal resp effort  no use of accessory muscles   CARD Regular rate and rhythm  Murmur  soft axillary systolic murmur   ABD denies tenderness  no liver/spleen enlargement  soft  normal BS  no Abdominal Bruits   LYMPH negative neck   EXTR negative cyanosis/clubbing, negative edema   SKIN No rashes, skin turgor decreased   NEURO Diffuse weakness   PSYCH Confused   Review of Systems:  ROS Pt not able to provide ROS  Dementia   Medications/Allergies Reviewed Medications/Allergies reviewed   Family & Social History:  Family and Social History:  Family History Non-Contributory  Non contributory with advanced age.   Social History No current tobacco or ETOH   Place of Living Nursing Home   Home Medications: Medication Instructions  Status  Calcarb with D 600 mg-400 intl units oral tablet 1 tab(s) orally once a day Active  Cerovite Senior Therapeutic Multiple Vitamins with Minerals oral tablet 1 tab(s) orally once a day Active  Osteo Bi-Flex Triple Strength with Ascorbic Acid and Minerals oral tablet 1 tab(s) orally once a day Active  Vitamin D3 1000 intl units oral tablet 1 tab(s) orally once a day Active  amiodarone 200 mg oral tablet 1 tab(s) orally once a day Active  Depakote Sprinkles 125 mg oral delayed release capsule 2 caps (2102m) orally once a day (at bedtime) Active  Eliquis 2.5 mg oral tablet 1 tab(s) orally 2 times a day Active  Megace ES 625 mg/5 mL oral suspension 5 milliliter(s) orally once a day Active  Melatonin 3 mg oral tablet 1 tab(s) orally once (at bedtime) Active  Metoprolol Succinate ER 25 mg oral tablet, extended release 1 tab(s) orally once a day Active  Senna S 50 mg-8.6 mg oral tablet 1 tab(s) orally 2 times a day Active  Synthroid 100 mcg (0.1 mg) oral tablet 1 tab(s) orally once a day Active  Tylenol 325 mg oral tablet 2 tab(s) orally every 6 hours, As Needed Active  Ultram 50 mg oral tablet 1 tab(s) orally 2 times a day Active  SEROquel 25 mg oral tablet 1 tab(s) orally once a day (in the evening) Active   Lab Results: Hepatic:  30-May-15 08:42   Bilirubin, Total 0.4  Alkaline Phosphatase 60 (45-117 NOTE: New  Reference Range 09/09/13)  SGPT (ALT) 30  SGOT (AST) 26  Total Protein, Serum 7.1  Albumin, Serum  3.1  TDMs:  30-May-15 08:42   Valproic Acid, Serum  17 (50-100 POTENTIALLY TOXIC:  > 200 mcg/mL)  Routine BB:  30-May-15 08:42   Crossmatch Unit 1 Ready  Crossmatch Unit 2 Ready (Result(s) reported on 18 Mar 2014 at 12:50PM.)  ABO Group + Rh Type A Positive  Antibody Screen NEGATIVE (Result(s) reported on 18 Mar 2014 at 10:25AM.)  Routine Chem:  30-May-15 08:42   Glucose, Serum  108  BUN  30  Creatinine (comp)  1.41  Sodium, Serum 139  Potassium, Serum 4.5   Chloride, Serum 103  CO2, Serum 25  Calcium (Total), Serum 9.2  Osmolality (calc) 284  eGFR (African American)  40  eGFR (Non-African American)  35 (eGFR values <51m/min/1.73 m2 may be an indication of chronic kidney disease (CKD). Calculated eGFR is useful in patients with stable renal function. The eGFR calculation will not be reliable in acutely ill patients when serum creatinine is changing rapidly. It is not useful in  patients on dialysis. The eGFR calculation may not be applicable to patients at the low and high extremes of body sizes, pregnant women, and vegetarians.)  Anion Gap 11  Cardiac:  30-May-15 08:42   Troponin I < 0.02 (0.00-0.05 0.05 ng/mL or less: NEGATIVE  Repeat testing in 3-6 hrs  if clinically indicated. >0.05 ng/mL: POTENTIAL  MYOCARDIAL INJURY. Repeat  testing in 3-6 hrs if  clinically indicated. NOTE: An increase or decrease  of 30% or more on serial  testing suggests a  clinically important change)  CPK-MB, Serum 1.7 (Result(s) reported on 18 Mar 2014 at 12:27PM.)    12:29   Troponin I < 0.02 (0.00-0.05 0.05 ng/mL or less: NEGATIVE  Repeat testing in 3-6 hrs  if clinically indicated. >0.05 ng/mL: POTENTIAL  MYOCARDIAL INJURY. Repeat  testing in 3-6 hrs if  clinically indicated. NOTE: An increase or decrease  of 30% or more on serial  testing suggests a  clinically important change)  CPK-MB, Serum 1.5 (Result(s) reported on 18 Mar 2014 at 01:08PM.)  Routine UA:  30-May-15 08:41   Color (UA) Yellow  Clarity (UA) Hazy  Glucose (UA) Negative  Bilirubin (UA) Negative  Ketones (UA) Negative  Specific Gravity (UA) 1.015  Blood (UA) 1+  pH (UA) 6.0  Protein (UA) 30 mg/dL  Nitrite (UA) Negative  Leukocyte Esterase (UA) 1+ (Result(s) reported on 18 Mar 2014 at 10:06AM.)  RBC (UA) 6 /HPF  WBC (UA) 26 /HPF  Bacteria (UA) TRACE  Epithelial Cells (UA) NONE SEEN  WBC Clump (UA) PRESENT (Result(s) reported on 18 Mar 2014 at 10:06AM.)  Routine  Coag:  30-May-15 08:42   Prothrombin 14.0  INR 1.1 (INR reference interval applies to patients on anticoagulant therapy. A single INR therapeutic range for coumarins is not optimal for all indications; however, the suggested range for most indications is 2.0 - 3.0. Exceptions to the INR Reference Range may include: Prosthetic heart valves, acute myocardial infarction, prevention of myocardial infarction, and combinations of aspirin and anticoagulant. The need for a higher or lower target INR must be assessed individually. Reference: The Pharmacology and Management of the Vitamin K  antagonists: the seventh ACCP Conference on Antithrombotic and Thrombolytic Therapy. CPTWSF.6812Sept:126 (3suppl): [id number] A HCT value >55% may artifactually increase the PT.  In one study,  the increase was an average of 25%. Reference:  "Effect on Routine and Special Coagulation  Testing Values of Citrate Anticoagulant Adjustment in Patients with High HCT Values." American Journal of Clinical Pathology 2006;126:400-405.)  Routine Hem:  30-May-15 08:42   WBC (CBC)  15.3  RBC (CBC)  3.52  Hemoglobin (CBC)  10.8  Hematocrit (CBC)  32.8  Platelet Count (CBC) 282 (Result(s) reported on 18 Mar 2014 at 08:59AM.)  MCV 93  MCH 30.8  MCHC 33.0  RDW  17.8   EKG:  EKG Interp. by me   Interpretation NSR, rate 73, LAD, LBBB, no acute ST T wave changes.   Radiology Results: XRay:    30-May-15 09:28, Ankle Left Complete  Ankle Left Complete   REASON FOR EXAM:    pain l ankle  COMMENTS:       PROCEDURE: DXR - DXR ANKLE LEFT COMPLETE  - Mar 18 2014  9:28AM     CLINICAL DATA:  Traumatic injury and pain    EXAM:  LEFT ANKLE COMPLETE - 3+ VIEW    COMPARISON:  None.    FINDINGS:  There is lucency in the distal fibula suggestive of a mildly  displaced distal fibular fracture. No other fractures are seen. No  significant soft tissue changes are noted.     IMPRESSION:  Changes suspicious of a distal  fibular fracture. Repeat imaging when  the patient's condition improves may be helpful.      Electronically Signed    By: Inez Catalina M.D.    On: 03/18/2014 10:05         Verified By: Everlene Farrier, M.D.,    30-May-15 09:28, Hip Left Complete  Hip Left Complete   REASON FOR EXAM:    pain l hip  COMMENTS:       PROCEDURE: DXR - DXR HIP LEFT COMPLETE  - Mar 18 2014  9:28AM     CLINICAL DATA:  Traumatic injury with pain    EXAM:  LEFT HIP - COMPLETE 2+ VIEW    COMPARISON:  None.    FINDINGS:  A comminuted fracture of the proximal left femur is noted involving  the intratrochanteric region. Impaction and angulation at the  fracture site is noted. Postsurgical changes are noted within the  pelvis. No soft tissue abnormality is seen.     IMPRESSION:  Proximal left intratrochanteric femoral fracture.      Electronically Signed    By: Inez Catalina M.D.    On: 03/18/2014 10:04         Verified By: Everlene Farrier, M.D.,    30-May-15 09:57, Chest 1 View AP or PA  Chest 1 View AP or PA   REASON FOR EXAM:    fall  COMMENTS:       PROCEDURE: DXR - DXR CHEST 1 VIEWAP OR PA  - Mar 18 2014  9:57AM     CLINICAL DATA:  Known femoral fracture, preoperative evaluation    EXAM:  CHEST - 1 VIEW    COMPARISON:  03/15/2014    FINDINGS:  Cardiac shadow is stable. A pacing device is again seen. Some  scarring is seen in the left lung base medially. No focal infiltrate  or sizable effusion is noted. Nipple shadows are noted bilaterally.     IMPRESSION:  Left basilar scarring.  No acute abnormality is noted.      Electronically Signed    By: Inez Catalina M.D.    On: 03/18/2014 10:17         Verified By: Everlene Farrier, M.D.,  CT:  30-May-15 12:00, CT Head Without Contrast  CT Head Without Contrast   REASON FOR EXAM:    CVA  COMMENTS:       PROCEDURE: CT  - CT HEAD WITHOUT CONTRAST  - Mar 18 2014 12:00PM     CLINICAL DATA:  Recent traumatic injury    EXAM:  CT  HEAD WITHOUT CONTRAST    TECHNIQUE:  Contiguous axial images were obtained from the base of the skull  through the vertex without intravenous contrast.    COMPARISON:  03/13/2014  FINDINGS:  The bony calvarium is intact. Chronic white matter ischemic change  and atrophy is noted. Additionally there are changes consistent with  a subacute evolving infarct in the right frontal and parietal lobes  the overall appearance is similar to that seen on the prior exam. On  the current exam however there is a new subdural hematoma identified  on the right related to the recent injury. It measures approximately  6 mm in greatest dimension. It was not seen on the prior exam. No  other areas of acute hemorrhage are seen.     IMPRESSION:  Evolving infarct in the region of the right middle cerebral artery  when compared with the prior exam.    New from the prior exam is a subdural hematoma on the right. This is  new from the prior exam. No significant midline shift is noted.    Critical Value/emergent results were called by telephone at the time  of interpretation on 03/18/2014 at 12:13 PM to Dr. Benjaman Lobe, who  verbally acknowledged these results.      Electronically Signed    By: Inez Catalina M.D.    On: 03/18/2014 12:17         Verified By: Everlene Farrier, M.D.,    No Known Allergies:   Vital Signs/Nurse's Notes: **Vital Signs.:   30-May-15 12:34  Vital Signs Type Admission  Temperature Temperature (F) 98.5  Celsius 36.9  Pulse Pulse 67  Respirations Respirations 18  Systolic BP Systolic BP 132  Diastolic BP (mmHg) Diastolic BP (mmHg) 69  Mean BP 99  Pulse Ox % Pulse Ox % 97  Pulse Ox Activity Level  At rest  Oxygen Delivery Room Air/ 21 %    Impression Preop:  The patient certainly is at high risk for cardiac and non cardiac complications with any surgical procedure given her frail state and multiple comorbidities.  There are however, no preoperative studies or therapies that  would be helpful in reducing that risk.  She would need to be followed closely on telemetry and her volume status followed closely perioperatively.  This increased risk was discussed with the patient, husband and daughter-in-law.  They understand and would proceed with surgery if it is to provide pain management  Atrial fib:  Given the new subdural hematoma Eliquis will need to be held.  Her cardioembolic risk is greater than 9% per year without anticoagulation.  However, the risk of continuing therapy at this time outweights the benefit.  Continue amiodarone and low dose beta blocker  Cardiomyopathy:  As above.  HTN:  BP slightly elevated.  Continue current therapy.   Electronic Signatures: Minus Breeding (MD)  (Signed [phone] 14:08)  Authored: General Aspect/Present Illness, History and Physical Exam, Review of System, Family & Social History, Home Medications, Labs, EKG , Radiology, Allergies, Vital Signs/Nurse's Notes, Impression/Plan   Last Updated: 30-May-15 14:08 by Minus Breeding (MD)

## 2015-02-10 NOTE — Op Note (Signed)
PATIENT NAME:  Dorothy [name], Dorothy [name] MR#:  [id number] DATE OF BIRTH:  10-05-1931  DATE OF PROCEDURE:  03/19/2014  PREOPERATIVE DIAGNOSIS: Open reduction and internal fixation left intertrochanteric hip fracture with a trochanteric fixation nail (125 degree/11 mm rod, 100 mm helical blade, 38 mm distal screw).   POSTOPERATIVE DIAGNOSIS: Open reduction and internal fixation left intertrochanteric hip fracture with a trochanteric fixation nail (125 degree/11 mm rod, 100 mm helical blade, 38 mm distal screw).   SURGEON: Valinda Hoar, MD   ANESTHESIA:  Total intravenous anesthesia (TIVA).   COMPLICATIONS: None.   ESTIMATED BLOOD LOSS: 200 mL. Replaced 1 unit of packed cells.   DESCRIPTION OF PROCEDURE: The patient was brought to the Operating Room where she underwent satisfactory general total intravenous anesthesia. She was placed on the fracture table and the right leg was flexed and abducted. The left leg was placed in traction and internally rotated. It was adducted slightly. Fluoroscopy showed the fracture to be reduced satisfactorily on AP and lateral views. The patient was comfortable and did not have any difficulty during the procedure. One unit of packed cells was given during the procedure due to her low blood count. The left hip was prepped and draped in sterile fashion and a short longitudinal incision was made just above the greater trochanter. Dissection was carried out sharply through subcutaneous tissue and fascia. The guidepin was inserted into the greater trochanter and advanced under fluoroscopy. It was advanced down the shaft of the femur and then a 17 mm reamer was used to enlarge the opening proximally. A 125 degree x 11 mm intramedullary  nail was inserted and seated under fluoroscopy. A second stab wound was made distally and aiming guide inserted at an angle of 125 degree.  A guidepin was inserted under fluoroscopic control into good position and the head and neck of the femur on  both AP and lateral view. The lateral cortex was drilled and a neck tract for the helical blade was drilled. A 100 mm helical blade was inserted and seated fully. Fluoroscopy showed it to be in good position on AP and lateral views. The set screw from above was then tightened securely. The distal guides were exchanged and another stab wound made  distally. The guide for the distal screw was inserted and this hole was drilled and filled with a 38 mm screw. Final fluoroscopy showed all hardware to be in good position. The fracture was aligned satisfactorily. The wounds were irrigated and hardware outriggers removed. The deep fascia was closed with 0 Vicryl and the subcutaneous tissues were all closed with 2-0 Vicryl. The skin was closed with staples. Dry sterile dressing was applied. The patient was taken out of traction and placed in her hospital bed. The hip had good motion without pain. She was taken to recovery room in good condition.    ____________________________ Valinda Hoar, MD hem:dd D: 03/19/2014 14:26:31 ET T: 03/19/2014 18:03:10 ET JOB#: [id number]  cc: Valinda Hoar, MD, <Dictator> Valinda Hoar MD ELECTRONICALLY SIGNED 03/19/2014 21:25

## 2015-02-10 NOTE — Consult Note (Signed)
PATIENT NAME:  Dorothy [name], Dorothy [name] MR#:  [id number] DATE OF BIRTH:  01/20/1931  DATE OF CONSULTATION:  03/18/2014  REFERRING PHYSICIAN:   CONSULTING PHYSICIAN:  Pauletta Browns, MD  REASON FOR CONSULTATION:  Stroke and right subdural.  HISTORY OF PRESENT ILLNESS:  Dorothy [name] is an 81 year old female resident of a nursing home facility with a history of atrial fibrillation, sick sinus syndrome, pacemaker, on Eliquis, presenting status post fall, found to have a left hip fracture. As per daughter, who was at bedside, at baseline, the patient is confused and needs full assistance with feeding and dressing herself. The patient is status post fall about 3 to 4 days ago and another fall prior to presentation to [facility]. The patient also has a history of stroke about 5 months ago with a residual slight left-sided weakness. On evaluation today on a CAT scan, the patient has a right subdural with probably a slight chronic component as well.   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   HOME MEDICATIONS:  Include Toprol, vitamin D, Ultram 50, Synthroid, Seroquel, melatonin Eliquis, Depakote, amiodarone.   ALLERGIES:  No known drug allergies.   SOCIAL HISTORY:  Negative for alcohol or tobacco use.   FAMILY HISTORY:  Positive for coronary artery disease, hypertension and stroke.  REVIEW OF SYSTEMS:  Unable to obtain at this time.   PHYSICAL EXAMINATION:  VITAL SIGNS:  Include a temperature of 98.5. Pulse 67, respirations 18, blood pressure of 159/69, saturating well on room air at 97%.   NEUROLOGIC:  The patient thinks that she is at home. Speech appears to be fluent, disoriented, could not tell me the date, time, which is close to her baseline. Extraocular  movements are intact. Pupils 3 mm, 2 mm, reactive bilaterally. The patient has a left facial droop that is chronic. Tongue appears to be midline. Minimal drift in the left upper extremity present. The left lower extremity unable to assess because of the left hip fracture, which her left lower extremity is in traction. Reflexes slightly increased in the left upper extremity, probably from the chronic stroke. Coordination intact on the right and left. Gait could not be assessed.   IMPRESSION:  An 81 year old female history of atrial fibrillation, sick sinus syndrome, on Eliquis, pacemaker, status post a fall 3 days ago, another prior to admission and found to have left hip fracture that needs OR. The patient also found to have a right acute, with some chronic component, subdural hemorrhage.   PLAN:  Start the patient on Keppra 500 mg q.12 hours for 7 days for acute seizure prophylaxis since subdurals are cortical and have a high tendency of seizures up to 40% of the patients. Would hold off surgery for at least a few days, possibly sometime next week, just to make sure that the bleed is stable and the patient does not have any seizures. I agree with repeat CAT scan in the morning since the patient was on Eliquis, holding anticoagulation for at least a few weeks. Treatment of urinary tract infection. I believe the right middle cerebral artery stroke is probably chronic where she has residual weakness in the left side. The case was discussed Dr. Judithann Sheen. Please call with any questions. It was a pleasure seeing this patient.    ____________________________ Pauletta Browns, MD yz:jm D: 03/18/2014 13:15:44 ET T:  03/18/2014 17:27:50 ET JOB#: [id number]  cc: Pauletta Browns, MD, <Dictator> Pauletta Browns MD ELECTRONICALLY SIGNED 03/20/2014 18:45

## 2015-02-10 NOTE — Consult Note (Signed)
PATIENT NAME:  Dorothy [name], MEHLER MR#:  [id number] DATE OF BIRTH:  11-Jul-1931  DATE OF CONSULTATION:  10/14/2013  REFERRING PHYSICIAN:  Dr. Elisabeth Pigeon CONSULTING PHYSICIAN:  Jerolyn Center A. Kirke Corin, MD  PRIMARY CARE PHYSICIAN:  Dr. Beverely Risen.   REASON FOR CONSULTATION:  Chest pain and myocardial infarction.   HISTORY OF PRESENT ILLNESS:  This is an 81 year old female with a past medical history of hypertension, hypothyroidism, severe malnutrition and weight loss and recent stroke.  She was hospitalized at [facility] from December 6th to December 16th after she presented with right MCA stroke.  She was treated with t-PA and clot retrieval.  She was intubated for a few days.  Echocardiogram showed an ejection fraction of 30% to 35%.  EKG showed left bundle branch block.  She had atrial fibrillation with rapid ventricular response which was treated with rate control.  She was anticoagulated with Eliquis and was discharged to [facility] nursing facility for rehab.  Today, she had substernal chest tightness which lasted for about 30 minutes.  It resolved spontaneously without intervention.  She is currently chest pain-free.  EKG showed left bundle branch block with slight worsening of ST elevation in the precordial leads.   PAST MEDICAL HISTORY: 1.  Recent right MCA stroke treated with t-PA and endovascular intervention done at [facility].  2.  Hypertension.  3.  Hypothyroidism.  4.  Atrial fibrillation, currently on amiodarone and Eliquis.  5.  Cachexia.   SOCIAL HISTORY:  Negative for smoking, alcohol or recreational drug use.  She is currently at [facility] for rehab.   FAMILY HISTORY:  Remarkable for hypertension.   HOME MEDICATIONS: 1.  Amiodarone 200 mg twice daily.  2.  Atorvastatin 20 mg daily.  3.  Eliquis 2.5 mg twice daily.  4.  Famotidine once daily.  5.  Fluconazole 100 mg once daily.  6.  Levothyroxine 75 mcg once daily.  7.  Lorazepam 0.5 mg every eight hours.  8.  Vitamin D3 once  daily.   REVIEW OF SYSTEMS:   A 10 point review of systems was performed.  It is negative other than what is mentioned in the HPI.   PHYSICAL EXAMINATION: GENERAL:  The patient is a cachectic who is currently in no acute distress.  VITAL SIGNS:  On presentation showed a temperature of 98.1, pulse of 88, respiratory rate 24, blood pressure of 85/52 and oxygen saturation is of 98% on room air.  HEENT:  Normocephalic, atraumatic.  NECK:  No JVD or carotid bruits.  RESPIRATORY:  Normal respiratory effort with no use of accessory muscles.  Auscultation reveals normal breath sounds.  CARDIOVASCULAR:  Normal PMI.  Normal S1 and S2 with no gallops or murmurs.  ABDOMEN:  Benign, nontender, nondistended.  EXTREMITIES:  No clubbing, cyanosis or edema.  SKIN:  Warm and dry with no rash.  PSYCHIATRIC:  She is alert, oriented x 3 with normal mood and affect.   LABORATORY, DIAGNOSTIC, AND RADIOLOGICAL DATA:  EKG showed left bundle branch block with anterior J-point elevation.  This seems to be slightly different from her most recent EKG.  Chest x-ray showed improved interstitial infiltrate.  BUN was 9 and creatinine is 1.1.  Potassium is 2.3.  Troponin of 0.09 with normal CK-MB.   IMPRESSION:  1.  Possible non-ST-elevation myocardial infarction.  2.  Recent right middle cerebral artery stroke.  3.  Atrial fibrillation currently in sinus rhythm, on amiodarone.  4.  Mild hypotension.  5.  Severe cachexia.   RECOMMENDATIONS:  The patient is currently chest pain-free.  I recommend medical therapy for her presumed myocardial infarction.  Continue serial cardiac enzymes.  The patient is on Eliquis.  She received a dose this morning.  No need for anticoagulation with heparin unless the troponin trends up.  Recent echocardiogram showed an ejection fraction of 30% to 35%.  I favor medical therapy for her presumed coronary artery disease given her comorbidities and cachexia.  Reserve cardiac catheterization for  refractory anginal symptoms.  For atrial fibrillation, she is currently in sinus rhythm.  Recommend continuing treatment with amiodarone and long-term anticoagulation.  Overall prognosis is poor.    ____________________________ Chelsea Aus Kirke Corin, MD maa:ea D: 10/14/2013 18:40:26 ET T: 10/14/2013 23:08:41 ET JOB#: [id number]  cc: Althia Egolf A. Kirke Corin, MD, <Dictator> Hope Pigeon. Elisabeth Pigeon, MD Lyndon Code, MD Jerolyn Center Argentina Donovan MD ELECTRONICALLY SIGNED 11/14/2013 9:27

## 2015-02-11 NOTE — Consult Note (Signed)
PATIENT NAME:  Dorothy [name], Dorothy [name] MR#:  [id number] DATE OF BIRTH:  09/26/1931  DATE OF CONSULTATION:  02/21/2012  REFERRING PHYSICIAN:   CONSULTING PHYSICIAN:  Collier Bullock. Gabriell Daigneault, MD  CHIEF COMPLAINT: Right hip pain status post mechanical fall.   HISTORY OF PRESENT ILLNESS: The patient sustained a mechanical fall when she was knocked over by a dog earlier on the day of presentation. She was seen in the Emergency Department and the orthopedic surgery service was consulted regarding her right lower extremity bony injury. The patient did not strike her head or lose consciousness during the fall. She did land on her elbow, however, she does not have ongoing pain or dysfunction in the elbow at this time.  PAST MEDICAL HISTORY:  1. Hypothyroidism.  2. Status post right shoulder rotator cuff tear.  3. Status post hysterectomy.   ALLERGIES: No known drug allergies.  HOME MEDICATIONS: Percocet as needed for shoulder pain.  FAMILY HISTORY: Some hypertension and cancer in her father.  SOCIAL HISTORY: The patient does not smoke, drink, or use other drugs. She currently lives at home.   REVIEW OF SYSTEMS: The patient denies fevers or chills. She denies cough, sputum production. Denies abdominal pain. Denies diarrhea. Denies constipation. Denies nausea, vomiting. Denies shortness of breath. Denies chest pain. Denies dysuria. She is tolerating a regular diet.   PHYSICAL EXAMINATION:   GENERAL: The patient is alert and oriented in no acute distress. Normal mood and affect.   HEENT: The patient's hearing is intact. She has moist oral mucosa.  NECK: Supple.  RESPIRATORY: Normal respiratory effort.  CARDIAC: Regular rate.  ABDOMEN: Nontender and soft.   LYMPHATIC: Negative inguinal lymphadenopathy to the right lower extremity   EXTREMITIES: Her extremities did not demonstrate significant cyanosis, clubbing, or edema. Focused exam of the right lower extremity demonstrates tenderness to palpation  over the greater trochanter. There is no skin abrasion or ulceration around the area in the hip. Distally the patient is neurovascularly intact with EHL, FHL, dorsiflexion, plantar flexion. Motor function intact. Sensation is intact throughout the right lower extremity. She has a 2+ dorsalis pedis pulse with good cap refill. Her foot is warm and well perfused.  SKIN: Normal to palpation with good turgor.  NEUROMOTOR: Sensory function appears intact.  PSYCHIATRIC: The patient is alert and oriented in no acute distress.   X-RAY FINDINGS: AP pelvis demonstrates posteriorly biased greater trochanteric fracture. Right hip x-ray demonstrates similar findings of greater trochanteric fracture. CT scan of the right hip demonstrates the above-mentioned right greater trochanteric fracture with no obvious disruption of the bony substance in the femoral neck or the intertrochanteric area. The mechanical integrity of this area appears to be intact.   ASSESSMENT: Right greater trochanteric fracture status post mechanical fall.  PLAN: The patient will be admitted to the general medicine service for pain control, work with physical therapy, and DVT prophylaxis as needed. Would recommend that the patient have directed physical therapy for early mobilization. She should be touchdown weightbearing to the right lower extremity with no active abduction to the right lower extremity due to her trochanteric fracture. Will plan to have the patient follow-up with Dr. Ernest Pine in clinic in approximately 2 to 3 weeks for further evaluation and x-rays to monitor her healing process. The patient's questions were answered regarding her injury and case was discussed with the attending physician.  ____________________________ Collier Bullock. Yuniel Blaney, MD cvs:drc D: 02/21/2012 23:32:00 ET T: 02/22/2012 10:19:49 ET JOB#: [id number]  cc: Collier Bullock. Kaydan Wilhoite, MD, <Dictator>  Otilio Saber MD ELECTRONICALLY SIGNED 02/29/2012 22:03

## 2015-02-11 NOTE — H&P (Signed)
PATIENT NAME:  Dorothy [name], Dorothy [name] MR#:  [id number] DATE OF BIRTH:  29-Oct-1930  DATE OF ADMISSION:  02/21/2012  PRIMARY CARE PHYSICIAN: Dr. Beverely Risen.   CHIEF COMPLAINT: I fell today.   HISTORY OF PRESENT ILLNESS: The patient is a 81 year old female with no significant past medical history. She only has history of hypothyroidism. She had no cardiac history in the past, no history of hypertension and diabetes, fairly active. Today while at the neighbor's place, she was holding a small dog in her hand, and then suddenly a big dog came along and then she turned and she fell. She landed on her right hip. She had a CT of the right hip done, and it showed that the patient had a fracture involving the greater trochanter; but after discussion with the orthopedic surgeon, it is not extending into the intertrochanteric area. So the patient does not need any hip surgery, only needs pain control and physical therapy. She got a dose of 2 mg of IV morphine and Toradol 20 mg p.o. in the Emergency Room. She denies any right hip pain at this time. She denies any complaints of nausea, vomiting, abdominal pain, chest pain, or shortness of breath. She does not have any frequent falls. She is being admitted by Medicine because this is nonoperative greater trochanteric fracture, and she needs pain control and physical therapy.   REVIEW OF SYSTEMS: CONSTITUTIONAL: She denies any fever or weakness. HEENT: No acute change in vision. No headache. No dizziness. RESPIRATORY: No cough. No dyspnea. CARDIOVASCULAR: She does not have any chest pain. No dyspnea on exertion. No palpitations or syncope. GASTROINTESTINAL: No nausea, vomiting, abdominal pain, GI bleed. GENITOURINARY: She denies any dysuria. She has thyroid problems. HEMATOLOGIC: No anemia. No bleeding from any site. MUSCULOSKELETAL: She has chronic right shoulder pain because of arthritis there. She denies any right hip pain at this time. NEUROLOGICAL:   No focal numbness or  weakness.  PSYCHIATRIC: No anxiety.   PAST MEDICAL HISTORY: Hypothyroidism. She does not have any history of hypertension, diabetes or heart problems.   PAST SURGICAL HISTORY: She said she had bilateral knee arthroscopic surgery in the past, and she had right shoulder surgery in the past and appendectomy.   ALLERGIES TO MEDICATIONS: None.  HOME MEDICATIONS:  1. Triamterene/hydrochlorothiazide 1 tab daily 37.5/ 25. 2. Levothyroxine 50 mcg daily.  3. Aspirin 81 mg daily. 4. Align, biotin daily.  5. Caltrate. 6. Centrum Silver.   SOCIAL HISTORY: She is quite active. 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 She denies any smoking, alcohol, or drug use.   FAMILY HISTORY: Mother died at the age of 15. She had heart problems. Her dad had cancer.   PHYSICAL EXAMINATION:  VITAL SIGNS: When she presented to the Emergency Room, temperature was 96.8, heart rate 64, respiratory rate 18, blood pressure 132/62. Saturating 100% on room air. Currently blood pressure is 150/96, saturating 98% on room air.   GENERAL: This is an elderly Caucasian female, thin built, very pleasant. She is comfortably lying in bed at this time. No acute distress.   HEENT: Bilateral pupils are equal. Extraocular muscles are intact. No scleral icterus. No conjunctivitis. Oral mucosa is moist. No pallor.   NECK: No thyroid tenderness, enlargement or nodule. Neck is supple. No masses, nontender. No adenopathy. No JVD. No carotid bruit.   CHEST: Bilateral breath sounds are clear. No wheeze. Normal effort. No respiratory distress.   HEART:  Heart sounds are regular. No murmur. Good peripheral pulses. No lower extremity edema.   ABDOMEN: Soft, nontender. Normal bowel sounds. No hepatomegaly. No bruit. No masses.   RECTAL: Exam is deferred.   NEUROLOGIC: She is awake, alert. She is oriented to time, place, and person. She has good insight into her problems. Cranial nerves  are intact. She can raise her left lower extremity and left upper extremity against gravity. The right lower extremity is not externally rotated. The right upper extremity she cannot raise against gravity because of her chronic right shoulder pain.   EXTREMITIES: No cyanosis. No clubbing.   SKIN: No rash. No lesions.   LABORATORY, DIAGNOSTIC AND RADIOLOGICAL DATA:  White count 5.9, hemoglobin 11.6, platelet count 182,000.  BMP: Sodium 139, potassium 3.6, BUN 25, creatinine 1.14.  LFTs are normal.  INR is 0.9.  Her CT of the right hip showed an acute fracture involving the greater trochanter, mild displacement of the fracture segments. The femoral head remains normally positioned within the acetabulum.  Pelvic x-ray shows acute fracture of the greater tuberosity of the right hip.  Chest x-ray is negative.  Her EKG shows that she has a sinus rhythm. She has left axis deviation. She has a left bundle branch block which is changed from her EKG, last one we have is in August 2011.   IMPRESSION:  1. Fracture of the greater tuberosity of the right femur or greater trochanteric fracture without any intertrochanteric fracture, nonoperative, status post fall.  2. Hypothyroidism.  3. Left bundle branch block on EKG.  PLAN: A 81 year old female, fairly healthy, with history of hypothyroidism, fairly active. Today she had a mechanical fall and no loss of consciousness, and she sustained a fracture of the greater trochanter of the right femur which does not cross the intertrochanteric space. I discussed with orthopedic surgeon, Dr. Verlon Setting. She needs physical therapy with partial weight-bearing and pain control. She does not need operative intervention at this time. We will consult Orthopedics. We will also give her deep vein thrombosis prophylaxis with Lovenox. We will hold her triamterene/hydrochlorothiazide at this time. We will give her some gentle hydration. Her BUN is slightly elevated. She has a left  bundle branch block on the EKG, but she has no cardiac symptoms. She has good exercise tolerance, I discussed this with Cardiology. Considering that she has no cardiac symptoms, we will not do any further work-up at this time. We will continue her levothyroxine, her low-dose aspirin, her calcium.    TIME SPENT:   Time spent with admission and coordination of care was 40 minutes.   ____________________________ Fredia Sorrow, MD ag:cbb D: 02/21/2012 19:46:10 ET T: 02/22/2012 08:27:48 ET JOB#: [id number]  cc: Fredia Sorrow, MD, <Dictator> Lyndon Code, MD Fredia Sorrow MD ELECTRONICALLY SIGNED 03/17/2012 12:36

## 2015-02-11 NOTE — Discharge Summary (Signed)
PATIENT NAME:  Dorothy, [name] MR#:  [id number] DATE OF BIRTH:  04-28-1931  DATE OF ADMISSION:  02/21/2012 DATE OF DISCHARGE:  02/22/2012   ADMITTING DIAGNOSIS: Closed hip fracture.  DISCHARGE DIAGNOSES: 1. Right greater trochanter fracture.  2. Right shoulder pain, chronic.  3. Hypothyroidism.  4. Pyuria, suspected urinary tract infection.   DISCHARGE CONDITION: Stable.   DISCHARGE MEDICATIONS: The patient is to resume her outpatient medications which are:  1. Percocet 5/325 mg 1 tablet every 4 to 6 hours as needed.  2. Levaquin 250 mg p.o. daily for three days.  3. Senna 1 tablet p.o. daily as needed.   HOME OXYGEN: None.   DIET: Regular.   PHYSICAL ACTIVITY LIMITATIONS: As tolerated except the patient is to have touchdown weightbearing on her right lower extremity as tolerated using crutches or walker.   FOLLOW-UP: She is to follow-up with Dr. Ernest Pine in one week after discharge as well as Dr. Beverely Risen in two days after discharge.    CONSULTANT: Dr. Verlon Setting   RADIOLOGIC STUDIES:  1. Pelvis AP only 02/21/2012 the patient has sustained an acute fracture of greater tuberosity of the right hip. No evidence of pelvic fracture.  2. Chest x-ray, one view, no evidence of acute cardiopulmonary abnormality.  3. Right hip complete x-ray 02/21/2012 the patient sustained a mildly displaced fracture of the greater trochanter.   4. CT of hip, right, without contrast 02/21/2012 showed sustained acute fracture involving the greater trochanter extending into the intertrochanteric region but no clearly involving lesser trochanter. There is mild displacement of fracture fragments. The femoral head remained normally positioned within the acetabulum according to the radiologist.   HISTORY OF PRESENT ILLNESS: The patient is a 81 year old Caucasian female with past medical history significant for history of chronic right shoulder pains due to osteoarthritis and history of hypothyroidism who presented  to the hospital after a fall. Please refer to Dr. Urban Gibson admission note on 02/21/2012.  PHYSICAL EXAMINATION: On arrival to the Emergency Room, the patient's temperature was 96.8, pulse 64, respiration rate was 18, blood pressure 132/62, saturation was 100% on room air. Physical exam showed the patient was able to raise her left lower extremity as well as left upper extremity against gravity. Right lower extremity was not externally rotated. Right upper extremity she was not able to raise against gravity due to chronic right shoulder pains. CT scan of the hip showed acute fracture involving greater trochanter as well as mild displacement of fracture fragments. However, the femoral head was normally positioned within the acetabulum.   LABORATORY, DIAGNOSTIC, AND RADIOLOGICAL DATA: Elevated BUN to 25, otherwise BMP was unremarkable. Liver enzymes were normal. CBC showed white blood cell count 5.9, hemoglobin 11.6, platelet count 182. Coagulation panel was within normal limits. Urinalysis showed yellow cloudy urine, negative for glucose, bilirubin or ketones. Specific gravity was 1.008, pH 6.0, negative for blood, protein, nitrites, 3+ leukocyte esterase were noted. No red blood cells, however, 15 to 30 white blood cells as well as 3+ bacteria, from 0 to 5 epithelial cells as well as transitional epithelial cells as well as white blood cell clumps were present.   HOSPITAL COURSE: The patient was admitted to the hospital and consultation with Dr. Verlon Setting, orthopedic surgeon, was obtained. Dr. Verlon Setting felt that patient had fall with greater trochanter fracture of right proximal femur. No loss of consciousness or other trauma was noted. He recommended physical therapy as well as right lower extremity touchdown weightbearing but no active abduction of right lower  extremity. The patient was evaluated by physical therapist who recommended physical therapy at home. The patient was recommended to be discharged home with  assistance. She is to follow-up with Dr. Ernest Pine in the next 1 to 2 weeks after discharge. She is going to make an appointment with Dr. Ernest Pine on Monday.   In regards to her shoulder pain, she is to continue pain medications.   In regards to pyuria, it was suspected urinary tract infection. Urine cultures were taken and the patient was started on Levaquin therapy. She is to continue this therapy for three days and follow-up with her primary care physician if she has any symptoms.   For hyperthyroidism, no medications were initiated. The patient is to follow-up with her primary care physician for recommendations. No TSH was checked during her stay in the hospital time. unfortunately.   DISPOSITION: The patient is being discharged in stable condition with the above-mentioned medications as well as follow-up.   VITAL SIGNS ON THE DAY OF DISCHARGE: Temperature 98.4, pulse 78, respiratory rate 20, blood pressure ranging from 96 to 120 systolic, oxygen saturation 97 to 98% on room air at rest.   TIME SPENT: 40 minutes.   ____________________________ Katharina Caper, MD rv:drc D: 02/22/2012 14:47:23 ET T: 02/22/2012 15:06:23 ET JOB#: [id number]  cc: Katharina Caper, MD, <Dictator> Illene Labrador. Angie Fava., MD Lyndon Code, MD Loretta Kluender Winona Legato MD ELECTRONICALLY SIGNED 02/28/2012 14:35

## 2015-02-11 NOTE — Consult Note (Signed)
Brief Consult Note: Diagnosis: right greater troch fracture.   Patient was seen by consultant.   Consult note dictated.   Recommend further assessment or treatment.   Discussed with Attending MD.   Comments: s/p fall with GT fracture of R proximal femur.  No LOC or other trauma noted.  PE in brief RLE EHL/FHL/TA/GS intact, sens intact, warm and well perfused, tender about the R hip  XR and CT demonstrate greater troch fracture with no obvious extension to the femoral neck or disruption of the substance of the intertrochanteric area.  Plan TDWB RLE no active abduction RLE PT pain control fu Dr. Ernest Pine in 2 weeks.  Electronic Signatures: Otilio Saber (MD)  (Signed [phone] 20:31)  Authored: Brief Consult Note   Last Updated: 04-May-13 20:31 by Otilio Saber (MD)

## 2015-04-18 IMAGING — NM NM LUNG SCAN
2 series · 16 of 16 positions shown · non-contrast
Comparison: Chest x-ray 09/01/2013.

CLINICAL DATA: Shortness of breath and chest pain.

EXAM:
NM LUNG SCAN
TECHNIQUE: Standard ventilation profusion scan obtained. Anterior, posterior,
and oblique images obtained.

[Series 1000: lung ventilation · 3.90mm/px · 4 acquisitions, 8 frames shown]
[im 1/4]
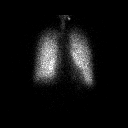
[im 1/4]
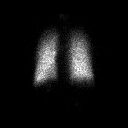
[im 2/4]
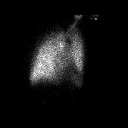
[im 2/4]
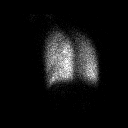
[im 3/4]
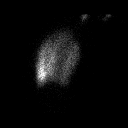
[im 3/4]
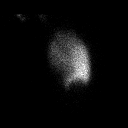
[im 4/4]
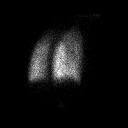
[im 4/4]
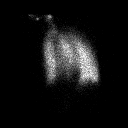

[Series 1000: lung perfusion · 1.95mm/px · 4 acquisitions, 8 frames shown]
[im 1/4]
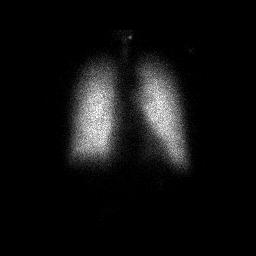
[im 1/4]
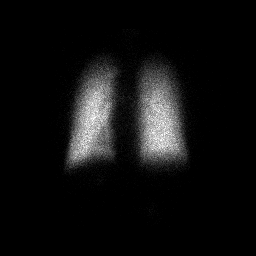
[im 2/4]
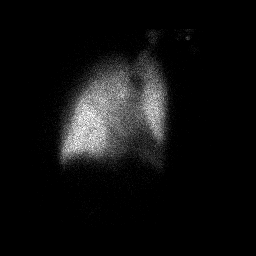
[im 2/4]
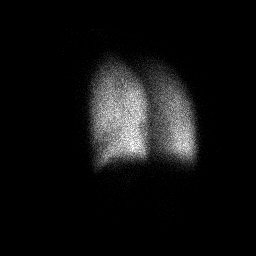
[im 3/4]
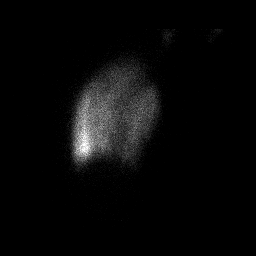
[im 3/4]
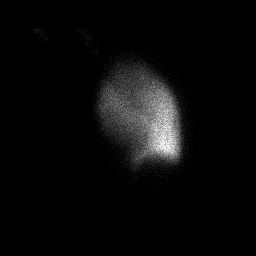
[im 4/4]
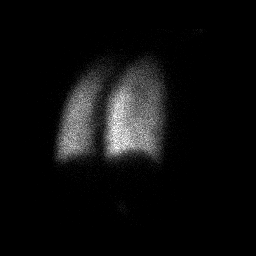
[im 4/4]
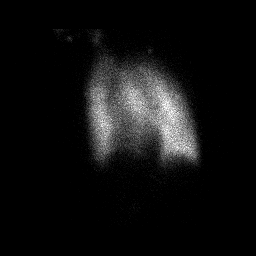

[16 of 16 positions shown; findings below may reference images not displayed]

RADIOPHARMACEUTICALS:  41.2 mCi of technetium 99 M DTPA for
ventilation and 4.3 mCi of technetium 99 M MAA for perfusion.
FINDINGS: Ventilation and perfusion appears normal. No evidence of pulmonary
embolus.
IMPRESSION: Normal ventilation perfusion scan.

## 2015-05-15 IMAGING — CR DG CHEST 1V PORT
1 series · 1 of 1 positions shown · non-contrast
Comparison: 09/26/2013

CLINICAL DATA: Pulmonary edema

EXAM:
PORTABLE CHEST - 1 VIEW

[AP]
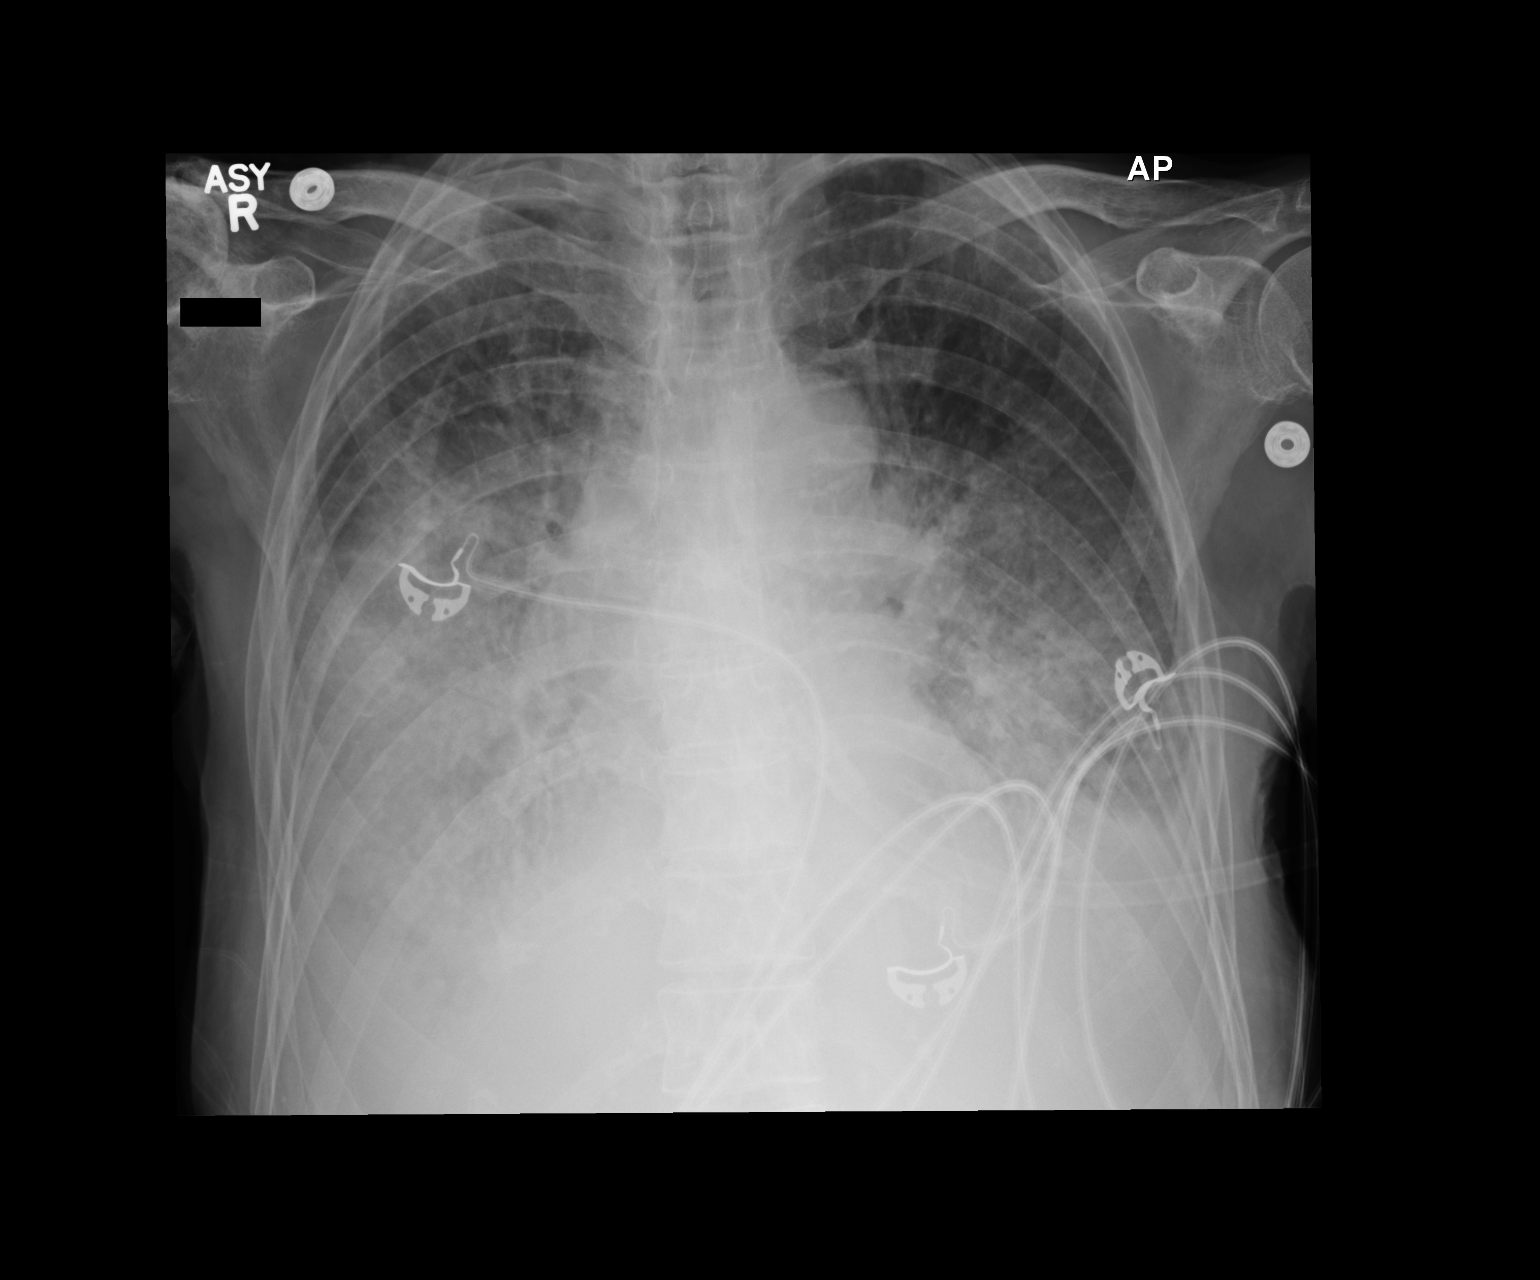

[1 of 1 positions shown; findings below may reference images not displayed]

FINDINGS: Cardiac shadow is stable. Persistent bilateral changes of pulmonary
edema are noted. These are worsened in the interval particularly on
the right side. Small effusions are noted bilaterally. No focal
confluent infiltrate is seen.
IMPRESSION: Worsening pulmonary edema.
